# Patient Record
Sex: Male | Born: 1955
Health system: Southern US, Community
[De-identification: ages and names within clinical notes are randomized; demographics above are authoritative.]

## PROBLEM LIST (undated history)

## (undated) DIAGNOSIS — N281 Cyst of kidney, acquired: Secondary | ICD-10-CM

## (undated) DIAGNOSIS — I10 Essential (primary) hypertension: Secondary | ICD-10-CM

## (undated) DIAGNOSIS — I2510 Atherosclerotic heart disease of native coronary artery without angina pectoris: Secondary | ICD-10-CM

## (undated) DIAGNOSIS — E039 Hypothyroidism, unspecified: Secondary | ICD-10-CM

## (undated) DIAGNOSIS — F1020 Alcohol dependence, uncomplicated: Secondary | ICD-10-CM

## (undated) DIAGNOSIS — K760 Fatty (change of) liver, not elsewhere classified: Secondary | ICD-10-CM

## (undated) DIAGNOSIS — G35 Multiple sclerosis: Secondary | ICD-10-CM

## (undated) DIAGNOSIS — R310 Gross hematuria: Secondary | ICD-10-CM

## (undated) DIAGNOSIS — E785 Hyperlipidemia, unspecified: Secondary | ICD-10-CM

## (undated) DIAGNOSIS — C7982 Secondary malignant neoplasm of genital organs: Secondary | ICD-10-CM

## (undated) DIAGNOSIS — G600 Hereditary motor and sensory neuropathy: Secondary | ICD-10-CM

## (undated) DIAGNOSIS — R972 Elevated prostate specific antigen [PSA]: Secondary | ICD-10-CM

## (undated) DIAGNOSIS — K5190 Ulcerative colitis, unspecified, without complications: Secondary | ICD-10-CM

## (undated) DIAGNOSIS — Z8489 Family history of other specified conditions: Secondary | ICD-10-CM

## (undated) DIAGNOSIS — F419 Anxiety disorder, unspecified: Secondary | ICD-10-CM

## (undated) HISTORY — DX: Cyst of kidney, acquired: N28.1

## (undated) HISTORY — DX: Secondary malignant neoplasm of genital organs: C79.82

## (undated) HISTORY — PX: FOOT SURGERY: SHX648

## (undated) HISTORY — DX: Elevated prostate specific antigen (PSA): R97.20

## (undated) HISTORY — PX: VASECTOMY: SHX75

## (undated) HISTORY — DX: Atherosclerotic heart disease of native coronary artery without angina pectoris: I25.10

## (undated) HISTORY — DX: Gross hematuria: R31.0

## (undated) HISTORY — DX: Alcohol dependence, uncomplicated: F10.20

---
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
## 1989-10-07 DIAGNOSIS — G35 Multiple sclerosis: Secondary | ICD-10-CM

## 1989-10-07 HISTORY — DX: Multiple sclerosis: G35

## 2012-10-07 DIAGNOSIS — I2510 Atherosclerotic heart disease of native coronary artery without angina pectoris: Secondary | ICD-10-CM

## 2012-10-07 HISTORY — DX: Atherosclerotic heart disease of native coronary artery without angina pectoris: I25.10

## 2012-10-22 ENCOUNTER — Encounter (HOSPITAL_COMMUNITY): Payer: Self-pay | Admitting: Emergency Medicine

## 2012-10-22 ENCOUNTER — Encounter (HOSPITAL_COMMUNITY): Payer: Self-pay | Admitting: Pharmacy Technician

## 2012-10-22 ENCOUNTER — Emergency Department (HOSPITAL_COMMUNITY): Payer: BC Managed Care – PPO

## 2012-10-22 ENCOUNTER — Encounter (HOSPITAL_COMMUNITY)
Admission: EM | Disposition: A | Discharge status: Home / Self Care | Payer: Self-pay | Source: Home / Self Care | Attending: Interventional Cardiology

## 2012-10-22 ENCOUNTER — Inpatient Hospital Stay (HOSPITAL_COMMUNITY)
Admission: EM | Admit: 2012-10-22 | Discharge: 2012-10-24 | DRG: 854 | Disposition: A | Discharge status: Home / Self Care | Payer: BC Managed Care – PPO | Attending: Interventional Cardiology | Admitting: Interventional Cardiology

## 2012-10-22 DIAGNOSIS — Z87891 Personal history of nicotine dependence: Secondary | ICD-10-CM

## 2012-10-22 DIAGNOSIS — I2510 Atherosclerotic heart disease of native coronary artery without angina pectoris: Principal | ICD-10-CM | POA: Diagnosis present

## 2012-10-22 DIAGNOSIS — I10 Essential (primary) hypertension: Secondary | ICD-10-CM | POA: Diagnosis present

## 2012-10-22 DIAGNOSIS — Z8249 Family history of ischemic heart disease and other diseases of the circulatory system: Secondary | ICD-10-CM

## 2012-10-22 DIAGNOSIS — R079 Chest pain, unspecified: Secondary | ICD-10-CM

## 2012-10-22 DIAGNOSIS — I249 Acute ischemic heart disease, unspecified: Secondary | ICD-10-CM

## 2012-10-22 DIAGNOSIS — E785 Hyperlipidemia, unspecified: Secondary | ICD-10-CM | POA: Diagnosis present

## 2012-10-22 DIAGNOSIS — I200 Unstable angina: Secondary | ICD-10-CM | POA: Diagnosis present

## 2012-10-22 DIAGNOSIS — G35 Multiple sclerosis: Secondary | ICD-10-CM

## 2012-10-22 DIAGNOSIS — K5190 Ulcerative colitis, unspecified, without complications: Secondary | ICD-10-CM

## 2012-10-22 HISTORY — DX: Multiple sclerosis: G35

## 2012-10-22 HISTORY — DX: Essential (primary) hypertension: I10

## 2012-10-22 HISTORY — DX: Hyperlipidemia, unspecified: E78.5

## 2012-10-22 HISTORY — DX: Ulcerative colitis, unspecified, without complications: K51.90

## 2012-10-22 HISTORY — PX: LEFT HEART CATHETERIZATION WITH CORONARY ANGIOGRAM: SHX5451

## 2012-10-22 HISTORY — DX: Fatty (change of) liver, not elsewhere classified: K76.0

## 2012-10-22 LAB — COMPREHENSIVE METABOLIC PANEL
ALT: 113 U/L — ABNORMAL HIGH (ref 0–53)
AST: 54 U/L — ABNORMAL HIGH (ref 0–37)
AST: 53 U/L — ABNORMAL HIGH (ref 0–37)
Albumin: 4.5 g/dL (ref 3.5–5.2)
BUN: 12 mg/dL (ref 6–23)
CO2: 26 mEq/L (ref 19–32)
CO2: 24 mEq/L (ref 19–32)
Calcium: 10.2 mg/dL (ref 8.4–10.5)
Calcium: 9.4 mg/dL (ref 8.4–10.5)
Chloride: 97 mEq/L (ref 96–112)
Creatinine, Ser: 0.76 mg/dL (ref 0.50–1.35)
GFR calc Af Amer: >90 mL/min (ref >90)
GFR calc non Af Amer: >90 mL/min (ref >90)
Glucose, Bld: 150 mg/dL — ABNORMAL HIGH (ref 70–99)
Sodium: 138 mEq/L (ref 135–145)
Total Bilirubin: 0.7 mg/dL (ref 0.3–1.2)
Total Protein: 8.4 g/dL — ABNORMAL HIGH (ref 6.0–8.3)

## 2012-10-22 LAB — CBC WITH DIFFERENTIAL/PLATELET
Basophils Absolute: 0.1 10*3/uL (ref 0.0–0.1)
Eosinophils Absolute: 0.1 10*3/uL (ref 0.0–0.7)
Eosinophils Relative: 2 % (ref 0–5)
Lymphocytes Relative: 43 % (ref 12–46)
MCV: 84.9 fL (ref 78.0–100.0)
Platelets: 197 10*3/uL (ref 150–400)
RDW: 13 % (ref 11.5–15.5)
WBC: 6.7 10*3/uL (ref 4.0–10.5)

## 2012-10-22 LAB — POCT ACTIVATED CLOTTING TIME: Activated Clotting Time: 807 s

## 2012-10-22 LAB — TROPONIN I: Troponin I: <0.3 ng/mL (ref <0.30)

## 2012-10-22 LAB — POCT I-STAT TROPONIN I

## 2012-10-22 SURGERY — LEFT HEART CATHETERIZATION WITH CORONARY ANGIOGRAM
Anesthesia: LOCAL

## 2012-10-22 MED ORDER — HEPARIN (PORCINE) IN NACL 2-0.9 UNIT/ML-% IJ SOLN
INTRAMUSCULAR | Status: AC
  Filled 2012-10-22: qty 1000

## 2012-10-22 MED ORDER — ASPIRIN 81 MG PO CHEW
81.0000 mg | CHEWABLE_TABLET | Freq: Every day | ORAL | Status: DC
  Administered 2012-10-23 – 2012-10-24 (×2): 81 mg via ORAL
  Filled 2012-10-22: qty 3
  Filled 2012-10-22 (×3): qty 1

## 2012-10-22 MED ORDER — NITROGLYCERIN IN D5W 200-5 MCG/ML-% IV SOLN
5.0000 ug/min | INTRAVENOUS | Status: DC
  Administered 2012-10-22: 5 ug/min via INTRAVENOUS

## 2012-10-22 MED ORDER — ADULT MULTIVITAMIN W/MINERALS CH
1.0000 | ORAL_TABLET | Freq: Every day | ORAL | Status: DC
  Administered 2012-10-23 – 2012-10-24 (×2): 1 via ORAL
  Filled 2012-10-22 (×3): qty 1

## 2012-10-22 MED ORDER — NITROGLYCERIN 0.4 MG SL SUBL
0.4000 mg | SUBLINGUAL_TABLET | SUBLINGUAL | Status: AC | PRN
  Administered 2012-10-22: 0.4 mg via SUBLINGUAL
  Filled 2012-10-22: qty 25

## 2012-10-22 MED ORDER — HYDROCHLOROTHIAZIDE 12.5 MG PO CAPS
12.5000 mg | ORAL_CAPSULE | Freq: Every day | ORAL | Status: DC
  Administered 2012-10-23 – 2012-10-24 (×2): 12.5 mg via ORAL
  Filled 2012-10-22 (×3): qty 1

## 2012-10-22 MED ORDER — LISINOPRIL 10 MG PO TABS
10.0000 mg | ORAL_TABLET | Freq: Every day | ORAL | Status: DC
  Administered 2012-10-23 – 2012-10-24 (×2): 10 mg via ORAL
  Filled 2012-10-22 (×3): qty 1

## 2012-10-22 MED ORDER — FENTANYL CITRATE 0.05 MG/ML IJ SOLN
INTRAMUSCULAR | Status: AC
  Filled 2012-10-22: qty 2

## 2012-10-22 MED ORDER — SODIUM CHLORIDE 0.9 % IV SOLN
0.2500 mg/kg/h | INTRAVENOUS | Status: DC
  Filled 2012-10-22: qty 250

## 2012-10-22 MED ORDER — ATORVASTATIN CALCIUM 40 MG PO TABS: 40.0000 mg | ORAL_TABLET | Freq: Every day | ORAL | Status: DC

## 2012-10-22 MED ORDER — NITROGLYCERIN IN D5W 200-5 MCG/ML-% IV SOLN
5.0000 ug/min | INTRAVENOUS | Status: DC
  Filled 2012-10-22: qty 250

## 2012-10-22 MED ORDER — TICAGRELOR 90 MG PO TABS
90.0000 mg | ORAL_TABLET | Freq: Two times a day (BID) | ORAL | Status: DC
  Administered 2012-10-22 – 2012-10-24 (×4): 90 mg via ORAL
  Filled 2012-10-22 (×5): qty 1

## 2012-10-22 MED ORDER — TICAGRELOR 90 MG PO TABS
ORAL_TABLET | ORAL | Status: AC
  Administered 2012-10-22: 22:00:00 90 mg via ORAL
  Filled 2012-10-22: qty 1

## 2012-10-22 MED ORDER — ONDANSETRON HCL 4 MG/2ML IJ SOLN: 4.0000 mg | Freq: Four times a day (QID) | INTRAMUSCULAR | Status: DC | PRN

## 2012-10-22 MED ORDER — SODIUM CHLORIDE 0.9 % IV SOLN
INTRAVENOUS | Status: DC
  Administered 2012-10-22 – 2012-10-24 (×4): via INTRAVENOUS

## 2012-10-22 MED ORDER — SODIUM CHLORIDE 0.9 % IV SOLN
0.2500 mg/kg/h | INTRAVENOUS | Status: AC
  Filled 2012-10-22: qty 250

## 2012-10-22 MED ORDER — BIVALIRUDIN 250 MG IV SOLR
INTRAVENOUS | Status: AC
  Filled 2012-10-22: qty 250

## 2012-10-22 MED ORDER — ACETAMINOPHEN 325 MG PO TABS: 650.0000 mg | ORAL_TABLET | ORAL | Status: DC | PRN

## 2012-10-22 MED ORDER — METOPROLOL TARTRATE 12.5 MG HALF TABLET: 12.5000 mg | ORAL_TABLET | Freq: Two times a day (BID) | ORAL | Status: DC

## 2012-10-22 MED ORDER — NITROGLYCERIN 0.2 MG/ML ON CALL CATH LAB
INTRAVENOUS | Status: AC
  Filled 2012-10-22: qty 1

## 2012-10-22 MED ORDER — SODIUM CHLORIDE 0.9 % IJ SOLN: 3.0000 mL | Freq: Two times a day (BID) | INTRAMUSCULAR | Status: DC

## 2012-10-22 MED ORDER — SODIUM CHLORIDE 0.9 % IJ SOLN: 3.0000 mL | INTRAMUSCULAR | Status: DC | PRN

## 2012-10-22 MED ORDER — SODIUM CHLORIDE 0.9 % IV SOLN: INTRAVENOUS | Status: DC

## 2012-10-22 MED ORDER — DIAZEPAM 5 MG PO TABS
5.0000 mg | ORAL_TABLET | ORAL | Status: AC
  Administered 2012-10-23: 5 mg via ORAL
  Filled 2012-10-22: qty 1

## 2012-10-22 MED ORDER — SODIUM CHLORIDE 0.9 % IJ SOLN
3.0000 mL | Freq: Two times a day (BID) | INTRAMUSCULAR | Status: DC
  Administered 2012-10-22: 22:00:00 3 mL via INTRAVENOUS

## 2012-10-22 MED ORDER — VERAPAMIL HCL 2.5 MG/ML IV SOLN
INTRAVENOUS | Status: AC
  Filled 2012-10-22: qty 2

## 2012-10-22 MED ORDER — MIDAZOLAM HCL 2 MG/2ML IJ SOLN
INTRAMUSCULAR | Status: AC
  Filled 2012-10-22: qty 2

## 2012-10-22 MED ORDER — LIDOCAINE HCL (PF) 1 % IJ SOLN
INTRAMUSCULAR | Status: AC
  Filled 2012-10-22: qty 30

## 2012-10-22 MED ORDER — ASPIRIN 81 MG PO CHEW
324.0000 mg | CHEWABLE_TABLET | Freq: Once | ORAL | Status: AC
  Administered 2012-10-22: 324 mg via ORAL
  Filled 2012-10-22: qty 4

## 2012-10-22 MED ORDER — NITROGLYCERIN IN D5W 200-5 MCG/ML-% IV SOLN
INTRAVENOUS | Status: AC
  Filled 2012-10-22: qty 250

## 2012-10-22 MED ORDER — TICAGRELOR 90 MG PO TABS
ORAL_TABLET | ORAL | Status: AC
  Filled 2012-10-22: qty 1

## 2012-10-22 MED ORDER — NITROGLYCERIN 0.4 MG SL SUBL: 0.4000 mg | SUBLINGUAL_TABLET | SUBLINGUAL | Status: DC | PRN

## 2012-10-22 MED ORDER — SODIUM CHLORIDE 0.9 % IV SOLN: 250.0000 mL | INTRAVENOUS | Status: DC | PRN

## 2012-10-22 MED ORDER — LISINOPRIL-HYDROCHLOROTHIAZIDE 10-12.5 MG PO TABS: 1.0000 | ORAL_TABLET | Freq: Every day | ORAL | Status: DC

## 2012-10-22 NOTE — ED Notes (Signed)
Results of troponin I shown to Dr. Lita Mains.

## 2012-10-22 NOTE — ED Provider Notes (Signed)
History     CSN: [id number]  Arrival date & time 10/22/12  1007   First MD Initiated Contact with Patient 10/22/12 1054      Chief Complaint  Patient presents with  . Chest Pain    (Consider location/radiation/quality/duration/timing/severity/associated sxs/prior treatment) HPI Pt recently started running and for the past few weeks has been experiencing L-sided chest pain with exertion relieved with rest. No SOB, nausea, radiation. Pt started having L chest pain last night that has persisted through the night though is now significantly improved. Pt seen by [facility] card over the summer with neg stress test. Has brother and father who have required CABG. +HTN.  Past Medical History  Diagnosis Date  . Hypertension   . Colitis   . Ulcerative colitis   . Hyperlipidemia LDL goal < 70   . Multiple sclerosis     Bilateral optic neuritis, treated and resolved but felt to be a manifestation of MS.  . Hepatic steatosis     No past surgical history on file.  Family History  Problem Relation Age of Onset  . Dementia Mother   . Hypertension Mother   . Coronary artery disease Father   . Diabetes Mellitus II Father   . Coronary artery disease Brother   . Hypertension Brother     History  Substance Use Topics  . Smoking status: Former Research scientist (life sciences)  . Smokeless tobacco: Not on file  . Alcohol Use: Yes     Comment: drinks beer and wine      Review of Systems  Constitutional: Negative for fever and chills.  HENT: Negative for neck pain.   Respiratory: Negative for cough and shortness of breath.   Cardiovascular: Positive for chest pain. Negative for palpitations and leg swelling.  Gastrointestinal: Negative for nausea, vomiting and abdominal pain.  Musculoskeletal: Negative for myalgias and back pain.  Skin: Negative for rash and wound.  Neurological: Negative for dizziness, syncope, weakness, light-headedness, numbness and headaches.    Allergies  Review of patient's allergies  indicates no known allergies.  Home Medications   Current Outpatient Rx  Name  Route  Sig  Dispense  Refill  . FLAXSEED (LINSEED) PO   Oral   Take 1 tablet by mouth daily.         Marland Kitchen LISINOPRIL-HYDROCHLOROTHIAZIDE 10-12.5 MG PO TABS   Oral   Take 1 tablet by mouth daily.         Creed Copper M PLUS PO TABS   Oral   Take 1 tablet by mouth daily.         . ASPIRIN 325 MG PO TABS   Oral   Take 325 mg by mouth as needed. For chest pain           BP 126/70  Pulse 60  Temp 98.7 F (37.1 C)  Resp 18  SpO2 94%  Physical Exam  Nursing note and vitals reviewed. Constitutional: He is oriented to person, place, and time. He appears well-developed and well-nourished. No distress.  HENT:  Head: Normocephalic and atraumatic.  Mouth/Throat: Oropharynx is clear and moist.  Eyes: EOM are normal. Pupils are equal, round, and reactive to light.  Neck: Normal range of motion. Neck supple.  Cardiovascular: Normal rate and regular rhythm.   Pulmonary/Chest: Effort normal and breath sounds normal. No respiratory distress. He has no wheezes. He has no rales. He exhibits no tenderness.  Abdominal: Soft. Bowel sounds are normal. He exhibits no mass. There is no tenderness. There is no rebound  and no guarding.  Musculoskeletal: Normal range of motion. He exhibits no edema and no tenderness.       No calf swelling or tenderness.   Neurological: He is alert and oriented to person, place, and time.  Skin: Skin is warm and dry. No rash noted. No erythema.  Psychiatric: He has a normal mood and affect. His behavior is normal.    ED Course  Procedures (including critical care time)  Labs Reviewed  CBC WITH DIFFERENTIAL - Abnormal; Notable for the following:    Hemoglobin 17.2 (*)     MCHC 36.4 (*)     All other components within normal limits  COMPREHENSIVE METABOLIC PANEL - Abnormal; Notable for the following:    Glucose, Bld 105 (*)     Total Protein 8.4 (*)     AST 54 (*)     ALT 113  (*)     All other components within normal limits  POCT I-STAT TROPONIN I - Abnormal; Notable for the following:    Troponin i, poc 0.09 (*)     All other components within normal limits  TROPONIN I  TROPONIN I  TROPONIN I  TSH  PRO B NATRIURETIC PEPTIDE  HEMOGLOBIN A1C   Dg Chest 2 View  10/22/2012  *RADIOLOGY REPORT*  Clinical Data: Left chest pain  CHEST - 2 VIEW  Comparison: None.  Findings: Lungs are clear. No pleural effusion or pneumothorax.  Cardiomediastinal silhouette is within normal limits.  Mild degenerative changes of the visualized thoracolumbar spine.  IMPRESSION: No evidence of acute cardiopulmonary disease.   Original Report Authenticated By: Julian Hy, M.D.      1. Chest pain      Date: 10/22/2012  Rate: 65  Rhythm: normal sinus rhythm  QRS Axis: normal  Intervals: normal  ST/T Wave abnormalities: normal  Conduction Disutrbances:none  Narrative Interpretation:   Old EKG Reviewed: none available    MDM  Discussed with Dr Tamala Julian who will see pt in ED.         Julianne Rice, MD 10/22/12 1344

## 2012-10-22 NOTE — CV Procedure (Signed)
Diagnostic Cardiac Catheterization Report  Gary [name]  57 y.o.  male [birth year]  Procedure Date: 10/22/2012 Referring Physician: Reece Levy, M.D. Primary Cardiologist:    PROCEDURE:  Left heart catheterization with selective coronary angiography, left ventriculogram, and PCI with stenting of the left circumflex .   INDICATIONS:   Acute coronary syndrome   The risks, benefits, and details of the procedure were explained to the patient.  The patient verbalized understanding and wanted to proceed.  Informed written consent was obtained.  PROCEDURE TECHNIQUE:  After Xylocaine anesthesia a  5 [place] and subsequent 6 French  sheath was placed in the right  radial  artery with a single anterior needle wall stick.   Coronary angiography was done using a  5 [place] A2 MP, 5 French 3.5 cm JL catheter.  Left ventriculography was done using a  5 French A2 MP catheter using hand injection.   A bivalirudin bolus and infusion was started. The patient was loaded with Brilinta, 180 mg, and the ACT was documented to be greater than 300.  After identifying severe lesions in the circumflex and LAD we upgraded to a 6 [place]. Because of a left innominate origin from the arch was leftward, I had difficulty getting into ascending aorta. Torquing and guide catheter movement was difficult. We eventually used a JL 3.0 guide catheter to perform PCI on the circumflex. A BMW wire was loaded with a 2.5 x 12 mm balloon. Predilatation was performed without difficulty.  We positioned and deployed a 16 x 3.5 mm Promus Premier DES to 14 atmospheres. We then post dilated with a 3.75 x 12 mm long [place] balloon to 14 atmospheres. The patient experienced no symptoms. Angiographic result was felt to be excellent.  We then turned our attention to the left anterior descending. I had difficulty selectively engaging the left anterior descending D2 a short left main and tendency for each guide cath to select the circumflex. The  guide catheter the that came closest to selectively engaging the LAD was an XB LAD 3 cm. After multiple attempts I ever I decided to terminate the case. I plan would be to return at a later date perhaps within 24-36 hours to perform the LAD via the right femoral approach. No complications occurred during the procedure.    CONTRAST:  Total of  225  cc.  COMPLICATIONS:  None.    HEMODYNAMICS:  Aortic pressure was  110/70 mmHg ; LV pressure was  112/5 mmHg ; LVEDP 13 mmHg.  There was no gradient between the left ventricle and aorta.    ANGIOGRAPHIC DATA:   The left main coronary artery is  is short and widely patent. .  The left anterior descending artery is poorly visualized due to subselectively of the circumflex by nearly every catheter that we used it is clear that there is severe obstruction in the mid LAD beyond the first septal perforator and diagonal. The LAD is large and transapical.  The left circumflex artery is severely diseased with a proximal segmental 95% the circumflex trifurcates on the lateral wall..  The right coronary artery is  dominant. Gives origin to the PDA and 2 left ventricular branches. The proximal vessel contains 50-70% stenosis .  PCI RESULTS:  The circumflex stenosis was reduced from 95% to 0% with TIMI grade 3 flow following stenting .  The LAD was never wired as I could not selectively engage the vessel from the right radial approach due to leftward origin of the innominate  artery from the arch. The plan is to return early in the a.m. from the right femoral approach to perform LAD PCI.   LEFT VENTRICULOGRAM:  Left ventricular angiogram was done in the 30 RAO projection and revealed normal left ventricular wall motion and systolic function with an estimated ejection fraction of  55%.  LVEDP was  12  mmHg.  IMPRESSIONS:  1. Acute coronary syndrome due to high-grade disease in the LAD and circumflex. There is intermedius stenosis in the proximal RCA.  2.  Successful stenting of the circumflex from 95% to 0% with TIMI grade 3 flow using DES.  3. Residual high-grade mid LAD disease  4. Normal left ventricular function.   RECOMMENDATION:  1. PCI of the LAD in a.m.  2. Aggressive risk factor modification.

## 2012-10-22 NOTE — H&P (Signed)
Gary [name] is a 57 y.o. male  Admit date: 10/22/2012 Referring Physician: Reece Levy, M.D. Primary Cardiologist:: Rosanna Randy, III Chief complaint / reason for admission: Acute coronary syndrome  HPI: 57 year old gentleman with a two-week history of exertional chest pressure. He has been active then tends to run to stay in shape. Since beginning of the year after 1/2-3/4 of a mile he develops chest pressure and dyspnea. Starting in resting will help it to go away. Last evening he had pain at rest after having a conversation with his wife that had to do her finances. He was having mild discomfort in his chest when he arrived in the emergency room this morning. One sublingual nitroglycerin relieved the discomfort. The first troponin I is mildly elevated at 0.09.    PMH:    Past Medical History  Diagnosis Date  . Hypertension   . Colitis   . Ulcerative colitis   . Hyperlipidemia LDL goal < 70   . Multiple sclerosis     Bilateral optic neuritis, treated and resolved but felt to be a manifestation of MS.  . Hepatic steatosis     PSH:   No past surgical history on file.  ALLERGIES:   Review of patient's allergies indicates no known allergies.  Prior to Admit Meds:   (Not in a hospital admission) Family HX:    Family History  Problem Relation Age of Onset  . Dementia Mother   . Hypertension Mother   . Coronary artery disease Father   . Diabetes Mellitus II Father   . Coronary artery disease Brother   . Hypertension Brother    Social HX:    History   Social History  . Marital Status: Married    Spouse Name: N/A    Number of Children: 1  . Years of Education: N/A   Occupational History  . 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 Smoking status: Former Research scientist (life sciences)  . Smokeless tobacco: Not on file  . Alcohol Use: Yes     Comment: drinks beer and wine  . Drug Use: Not on file  . Sexually Active: Yes   Other Topics Concern  . Not on file   Social  History Narrative  . No narrative on file     ROS: He has occasional and and all distorted tissue. He has a history of ulcer of colitis and has had rarely over the past 20 years, hematochezia. He denies recent surgery. He denies orthopnea, PND, palpitations, syncope, claudication, neurological deficits, headache, and intrinsic lung disease  Physical Exam: Blood pressure 126/70, pulse 60, temperature 98.7 F (37.1 C), resp. rate 18, SpO2 94.00%.   Skin is warm and dry  HEENT exam reveals pupils are equal and reactive to light.  Neck exam reveals no JVD or carotid bruits. Carotid upstroke is 2+.  Cardiac exam reveals an S4 gallop. No murmurs heard. PMI is nonpalpable.  Lungs are clear auscultation and percussion.  Abdomen is soft. No bruits are heard. There is no tenderness.  Extremities no edema. Pulses are 2+ and symmetric in the radials, femorals, and posterior tibials bilaterally.  Neurological exam reveals no focal motor or sensory deficit. Labs:   Lab Results  Component Value Date   WBC 6.7 10/22/2012   HGB 17.2* 10/22/2012   HCT 47.2 10/22/2012   MCV 84.9 10/22/2012   PLT 197 10/22/2012    Lab 10/22/12 1025  NA 138  K 3.9  CL 99  CO2 26  BUN 13  CREATININE 0.74  CALCIUM 10.2  PROT 8.4*  BILITOT 0.5  ALKPHOS 81  ALT 113*  AST 54*  GLUCOSE 105*      Radiology:   RADIOLOGY REPORT*  Clinical Data: Left chest pain  CHEST - 2 VIEW  Comparison: None.  Findings: Lungs are clear. No pleural effusion or pneumothorax.  Cardiomediastinal silhouette is within normal limits.  Mild degenerative changes of the visualized thoracolumbar spine.  IMPRESSION:  No evidence of acute cardiopulmonary disease.  Original Report Authenticated By: Julian Hy, M.D.   EKG:  Normal  ASSESSMENT:   1. Acute coronary syndrome with patient currently pain free  2. History of ulcerative colitis with prior history of GI bleed though never required transfusion.  3. Bilateral  optic neuritis with subsequent resolution felt to be a manifestation of multiple sclerosis. Clinically resolved.  4. Hypertension   Plan:  1. Because her presentation, I believe the patient needs urgent coronary angiography to define anatomy which will then help guide therapy. I discussed the clinical indication for catheterization and the risk involved. Risks including stroke, death, myocardial infarction, bleeding, allergy, limb ischemia, kidney injury, among others were discussed in detail with the patient and his wife. The patient has accepted this diagnostic test and is willing to proceed.  2. IV heparin  3. Beta blocker therapy  4. Statin therapy  5. Aspirin likely other antiplatelet agents  6. Percutaneous coronary intervention with stenting will carry a slightly greater risk than typical because of the patient's ulcerative colitis and the potential for dual antiplatelet therapy interruption.  Sinclair Grooms 10/22/2012 1:34 PM

## 2012-10-22 NOTE — ED Notes (Signed)
Cp since last night  Left sided  No n/v/sob no dizziness  straong family hx of heart disease dad and bro

## 2012-10-22 NOTE — ED Notes (Signed)
Pt transported to Cath Lab with RN on monitor

## 2012-10-23 ENCOUNTER — Encounter (HOSPITAL_COMMUNITY)
Admission: EM | Disposition: A | Discharge status: Home / Self Care | Payer: Self-pay | Source: Home / Self Care | Attending: Interventional Cardiology

## 2012-10-23 HISTORY — PX: PERCUTANEOUS CORONARY STENT INTERVENTION (PCI-S): SHX5485

## 2012-10-23 LAB — LIPID PANEL
Cholesterol: 237 mg/dL — ABNORMAL HIGH (ref 0–200)
HDL: 33 mg/dL — ABNORMAL LOW (ref >39)
Triglycerides: 300 mg/dL — ABNORMAL HIGH (ref <150)
VLDL: 60 mg/dL — ABNORMAL HIGH (ref 0–40)

## 2012-10-23 LAB — BASIC METABOLIC PANEL
CO2: 24 mEq/L (ref 19–32)
Calcium: 9 mg/dL (ref 8.4–10.5)
Potassium: 3.6 mEq/L (ref 3.5–5.1)
Sodium: 139 mEq/L (ref 135–145)

## 2012-10-23 LAB — CBC
MCH: 29.9 pg (ref 26.0–34.0)
Platelets: 166 10*3/uL (ref 150–400)
RBC: 4.99 MIL/uL (ref 4.22–5.81)
WBC: 6.8 10*3/uL (ref 4.0–10.5)

## 2012-10-23 LAB — POCT ACTIVATED CLOTTING TIME
Activated Clotting Time: 415 seconds
Activated Clotting Time: >1000 seconds

## 2012-10-23 LAB — PROTIME-INR: INR: 0.89 (ref 0.00–1.49)

## 2012-10-23 SURGERY — PERCUTANEOUS CORONARY STENT INTERVENTION (PCI-S)
Anesthesia: LOCAL

## 2012-10-23 MED ORDER — LIDOCAINE HCL (PF) 1 % IJ SOLN
INTRAMUSCULAR | Status: AC
  Filled 2012-10-23: qty 30

## 2012-10-23 MED ORDER — ATORVASTATIN CALCIUM 20 MG PO TABS
20.0000 mg | ORAL_TABLET | Freq: Every day | ORAL | Status: DC
  Administered 2012-10-23: 18:00:00 20 mg via ORAL
  Filled 2012-10-23 (×2): qty 1

## 2012-10-23 MED ORDER — TICAGRELOR 90 MG PO TABS: 90.0000 mg | ORAL_TABLET | Freq: Two times a day (BID) | ORAL | Status: DC

## 2012-10-23 MED ORDER — BIVALIRUDIN 250 MG IV SOLR
INTRAVENOUS | Status: AC
  Filled 2012-10-23: qty 250

## 2012-10-23 MED ORDER — FENTANYL CITRATE 0.05 MG/ML IJ SOLN
INTRAMUSCULAR | Status: AC
  Filled 2012-10-23: qty 2

## 2012-10-23 MED ORDER — MIDAZOLAM HCL 2 MG/2ML IJ SOLN
INTRAMUSCULAR | Status: AC
  Filled 2012-10-23: qty 2

## 2012-10-23 MED ORDER — ATORVASTATIN CALCIUM 20 MG PO TABS: 20.0000 mg | ORAL_TABLET | Freq: Every day | ORAL | Status: DC

## 2012-10-23 MED ORDER — NITROGLYCERIN 0.4 MG SL SUBL: 0.4000 mg | SUBLINGUAL_TABLET | SUBLINGUAL | Status: DC | PRN

## 2012-10-23 MED ORDER — METOPROLOL SUCCINATE ER 25 MG PO TB24: 25.0000 mg | ORAL_TABLET | Freq: Every day | ORAL | Status: DC

## 2012-10-23 MED ORDER — METOPROLOL SUCCINATE ER 25 MG PO TB24
25.0000 mg | ORAL_TABLET | Freq: Every day | ORAL | Status: DC
  Administered 2012-10-23 – 2012-10-24 (×2): 25 mg via ORAL
  Filled 2012-10-23 (×2): qty 1

## 2012-10-23 MED ORDER — NITROGLYCERIN 0.2 MG/ML ON CALL CATH LAB
INTRAVENOUS | Status: AC
  Filled 2012-10-23: qty 1

## 2012-10-23 MED ORDER — HEPARIN (PORCINE) IN NACL 2-0.9 UNIT/ML-% IJ SOLN
INTRAMUSCULAR | Status: AC
  Filled 2012-10-23: qty 1000

## 2012-10-23 MED ORDER — ASPIRIN 81 MG PO CHEW: 81.0000 mg | CHEWABLE_TABLET | Freq: Every day | ORAL | Status: DC

## 2012-10-23 MED FILL — Dextrose Inj 5%: INTRAVENOUS | Qty: 50 | Status: AC

## 2012-10-23 NOTE — Progress Notes (Signed)
Utilization Review Completed Secily Walthour J. Ebb Carelock, RN, BSN, NCM [phone]  

## 2012-10-23 NOTE — H&P (Addendum)
   Patient Name: Gary [name] Date of Encounter: 10/23/2012   SUBJECTIVE: No difficulty overnight. Creat is .8. There were no radial site abnormalities. We are proceeding with LAD PCI from femoral approach since unable to selectively engage the LAD from radial.  He has not had angina or other complaints overnight.  TELEMETRY:  Normal sinus rhythm: Filed Vitals:   10/22/12 2237 10/23/12 0025 10/23/12 0445 10/23/12 0819  BP:  114/71 120/65   Pulse:  54 60 65  Temp:  97.9 F (36.6 C) 97.8 F (36.6 C)   TempSrc:  Oral Oral   Resp:  15 16   Height: 6' (1.829 m)     Weight:  108 kg (238 lb 1.6 oz)    SpO2:  99% 100%     Intake/Output Summary (Last 24 hours) at 10/23/12 1113 Last data filed at 10/23/12 0800  Gross per 24 hour  Intake 2441.5 ml  Output   1225 ml  Net 1216.5 ml    LABS: Basic Metabolic Panel:  Basename 10/23/12 0518 10/22/12 1939  NA 139 136  K 3.6 3.1*  CL 102 97  CO2 24 24  GLUCOSE 105* 150*  BUN 12 12  CREATININE 0.77 0.76  CALCIUM 9.0 9.4  MG -- --  PHOS -- --   CBC:  Basename 10/23/12 0518 10/22/12 1025  WBC 6.8 6.7  NEUTROABS -- 3.2  HGB 14.9 17.2*  HCT 42.5 47.2  MCV 85.2 84.9  PLT 166 197   Cardiac Enzymes:  Basename 10/22/12 1343  CKTOTAL --  CKMB --  CKMBINDEX --  TROPONINI <0.30   Hemoglobin A1C:  Basename 10/22/12 1349  HGBA1C 5.8*   Fasting Lipid Panel:  Basename 10/23/12 0518  CHOL 237*  HDL 33*  LDLCALC 144*  TRIG 300*  CHOLHDL 7.2  LDLDIRECT --    Radiology/Studies:  No new data  Physical Exam: Blood pressure 120/65, pulse 65, temperature 97.8 F (36.6 C), temperature source Oral, resp. rate 16, height 6' (1.829 m), weight 108 kg (238 lb 1.6 oz), SpO2 100.00%. Weight change:    Right radial cath site is unremarkable  Lungs are clear auscultation and percussion  Cardiac exam reveals an S4   ASSESSMENT:  1. Acute coronary syndrome with high-grade obstruction in the circumflex and LAD.  2. Successful  stenting of the circumflex 18 hours ago.  3. Uncontrolled was factors of hypertension and hyperlipidemia.  4. Ulcerative colitis without evidence of bleeding on current anticoagulation regimen  Plan:  1. PCI of LAD this morning  2. If all goes well would anticipate discharge 10/24/2012  SignedSinclair Grooms 10/23/2012, 11:13 AM

## 2012-10-23 NOTE — Progress Notes (Signed)
TR BAND REMOVAL  LOCATION:    right radial  DEFLATED PER PROTOCOL:    yes  TIME BAND OFF / DRESSING APPLIED:    2130  SITE UPON ARRIVAL:    Level 0  SITE AFTER BAND REMOVAL:    Level 0  REVERSE ALLEN'S TEST:     positive  CIRCULATION SENSATION AND MOVEMENT:    Within Normal Limits   yes  COMMENTS:   Post radial cath instructions reviewed w/ pt.  Wearing an armboard loosely for fear of moving it too much.

## 2012-10-23 NOTE — Discharge Summary (Addendum)
Patient ID: Gary [name] MRN: [id number] DOB/AGE: 11-01-1955 57 y.o.  Admit date: 10/22/2012 Discharge date: 10/23/2012  Patient Active Problem List  Diagnosis  . Ulcerative colitis  . Hypertension, essential  . Acute coronary syndrome  . Hyperlipidemia  . H/O multiple sclerosis    Primary Discharge Diagnosis:  Acute coronary syndrome Secondary Discharge Diagnosis: Ulcerative colitis, relatively inactive  2. Hypertension  3. Hyperlipidemia  4. Multiple sclerosis  Significant Diagnostic Studies: angiography: 1. Diagnostic cath and circumflex DE stenting 10/22/12  2. DES stent implantation LAD, 10/23/12  Consults: None  Hospital Course:  57 year old gentleman presented with a 2 to 3-week history of exertional chest pain compatible with accelerating angina. In the emergency room a single point of care marker was mildly elevated. No acute EKG changes were noted. Urging catheterization was performed and revealed high-grade obstruction in the LAD and circumflex. The right coronary had a proximal indeterminate stenosis.  The circumflex was stented during the diagnostic procedure. We were unable to find a catheter that could selectively engage the LAD (short LM/separate ostial left coronary system). We therefore brought the patient back the following morning and did PCI of the LAD from the femoral approach without complications.  With ambulation and education from cardiac rehabilitation, the patient has felt well, had questions answered, and had no post-catheterization complications.   Discharge Exam: Blood pressure 120/65, pulse 65, temperature 97.8 F (36.6 C), temperature source Oral, resp. rate 16, height 6' (1.829 m), weight 108 kg (238 lb 1.6 oz), SpO2 100.00%.   No right radial or femoral access site complications or hematoma.  Chest is clear  Cardiac exam unremarkable Labs:   Lab Results  Component Value Date   WBC 6.8 10/23/2012   HGB 14.9 10/23/2012   HCT 42.5  10/23/2012   MCV 85.2 10/23/2012   PLT 166 10/23/2012    Lab 10/23/12 0518 10/22/12 1939  NA 139 --  K 3.6 --  CL 102 --  CO2 24 --  BUN 12 --  CREATININE 0.77 --  CALCIUM 9.0 --  PROT -- 7.6  BILITOT -- 0.7  ALKPHOS -- 70  ALT -- 101*  AST -- 53*  GLUCOSE 105* --   Lab Results  Component Value Date   TROPONINI <0.30 10/22/2012    Lab Results  Component Value Date   CHOL 237* 10/23/2012   Lab Results  Component Value Date   HDL 33* 10/23/2012   Lab Results  Component Value Date   LDLCALC 144* 10/23/2012   Lab Results  Component Value Date   TRIG 300* 10/23/2012   Lab Results  Component Value Date   CHOLHDL 7.2 10/23/2012   No results found for this basename: LDLDIRECT      Radiology: No acute disease  EKG:  New precordial biphasic T waves  FOLLOW UP PLANS AND APPOINTMENTS    Medication List     As of 10/23/2012 11:21 AM    STOP taking these medications         aspirin 325 MG tablet      FLAXSEED (LINSEED) PO      TAKE these medications         aspirin 81 MG chewable tablet   Chew 1 tablet (81 mg total) by mouth daily.      atorvastatin 20 MG tablet   Commonly known as: LIPITOR   Take 1 tablet (20 mg total) by mouth daily at 6 PM.      lisinopril-hydrochlorothiazide 10-12.5 MG per tablet  Commonly known as: PRINZIDE,ZESTORETIC   Take 1 tablet by mouth daily.      metoprolol succinate 25 MG 24 hr tablet   Commonly known as: TOPROL-XL   Take 1 tablet (25 mg total) by mouth daily.      multivitamins ther. w/minerals Tabs   Take 1 tablet by mouth daily.      nitroGLYCERIN 0.4 MG SL tablet   Commonly known as: NITROSTAT   Place 1 tablet (0.4 mg total) under the tongue every 5 (five) minutes as needed for chest pain.      Ticagrelor 90 MG Tabs tablet   Commonly known as: BRILINTA   Take 1 tablet (90 mg total) by mouth 2 (two) times daily.           Follow-up Information    Follow up with Sinclair Grooms, MD. On 11/02/2012. (10:10 A)      Contact information:   [place] [phone]          BRING ALL MEDICATIONS WITH YOU TO FOLLOW UP APPOINTMENTS  Time spent with patient to include physician time:  30 minutes Signed: Sinclair Grooms 10/23/2012, 11:21 AM

## 2012-10-23 NOTE — CV Procedure (Signed)
   PERCUTANEOUS CORONARY INTERVENTION   Gary [name] is a 57 y.o. male  INDICATION: Acute coronary syndrome with 99% mid LAD and TIMI grade 2 flow   PROCEDURE: LAD drug-eluting stent implantation   CONSENT: The risks, benefits, and details of the procedure were explained to the patient. Risks including death, MI, stroke, bleeding, limb ischemia, renal failure and allergy were described and accepted by the patient.  Informed written consent was obtained prior to proceeding.  PROCEDURE TECHNIQUE:  After Xylocaine anesthesia a 6 French sheath was placed in the right femoral artery with a single anterior needle wall stick.   Coronary guiding shots were made using a 3.5 XB LAD 6 French catheter. Antithrombotic therapy, bivalirudin bolus and infusion, was begun and determined to be therapeutic by ACT. Antiplatelet therapy, Brilinta, was loaded.  We used a parotid guidewire across the stenosis in the LAD. We used an LAO cranial view as our guiding shot. I didn't RAO view but did not fully appreciate the length of the lesion. We predilated with a 3.5 x 12 mm balloon. Post dilatation the lesion appeared to be longer than previously thought. We then positioned and deployed a 4.0 x 20 mm long Promus Premier to 10 atmospheres. The stent had already been chosen prior to seeing the post dilatation anatomy. After deployment I felt that there was still a very short region of disease proximal to the stent that needed therapy. We then placed a 4.0 x 12 mm Promus Premier slightly overlapping the first stent. The stent was deployed at 14 atmospheres. Postdilatation was then performed using a 4.5 x 15 mm long [place] Quantum throughout the proximal two thirds of the stented segment up to 14 atmospheres. Postdilatation the distal third of the stent appeared to be under and a then used a 4.0 x 12 mm [place] balloon in the distalmost the third of the stent dilating to 14 atmospheres the final angiographic result was very acceptable.  TIMI grade 3 flow was noted. A 0% stenosis was noted . Angio-Seal was used for hemostasis.  CONTRAST:  Total of 110 cc.  COMPLICATIONS:  None.    ANGIOGRAPHIC RESULTS:   99% focal/eccentrically segmental mid LAD stenosis with TIMI grade 2 flow reduced to 0% with TIMI grade 3 flow following drug-eluting stent implantation. Unfortunately the overlapping stents were used but angiographic result was very nice.   IMPRESSIONS:  Successful DES implantation in the mid LAD with reduction and 99% stenosis with TIMI grade 2 flow to 0% with TIMI grade 3 flow.   RECOMMENDATION:  Aspirin and Brilinta  Anticipate a.m. discharge, 10/24/2012.Marland Kitchen    Sinclair Grooms, MD 10/23/2012 9:59 AM

## 2012-10-23 NOTE — Progress Notes (Signed)
10/23/12 1530 informed pt of Brilinta copay of $75/30 day retail and $187.99/90 day mail-in.  Pt will be able to afford this medication.  Checked with [facility] [address] to confirm supply as pt will [facility] from [facility]. Pt/family given Brilinta 30 day free card by [facility] staff. Fuller Mandril, RN, BSN, [place] [phone].

## 2012-10-24 LAB — BASIC METABOLIC PANEL
Calcium: 8.7 mg/dL (ref 8.4–10.5)
GFR calc Af Amer: >90 mL/min (ref >90)
GFR calc non Af Amer: >90 mL/min (ref >90)
Potassium: 4 mEq/L (ref 3.5–5.1)
Sodium: 137 mEq/L (ref 135–145)

## 2012-10-24 LAB — CBC
MCH: 30.8 pg (ref 26.0–34.0)
MCHC: 36 g/dL (ref 30.0–36.0)
Platelets: 159 10*3/uL (ref 150–400)
RDW: 13 % (ref 11.5–15.5)

## 2012-10-24 NOTE — Progress Notes (Signed)
CARDIAC REHAB PHASE I   PRE:  Rate/Rhythm: 62 sinus rhythm  BP:  Supine:   Sitting: 128/72  Standing:    SaO2: 96%  MODE:  Ambulation: 700 ft   POST:  Rate/Rhythem: 58 sinus  BP:  Supine:   Sitting: 00050  Standing:    SaO2: 99%  802-904 Pt ambulated in hallway without difficulty.  Steady gait. Pt education complete.  Pt oriented to phase II cardiac rehab.  At pt request referral will be sent to [facility] outpatient rehab.  Pt and wife verbalized understanding  [place], [place]

## 2012-10-24 NOTE — Progress Notes (Signed)
Subjective:  No c/o chest pain or SOB  Objective:  Vital Signs in the last 24 hours: BP 151/73  Pulse 65  Temp 98 F (36.7 C) (Oral)  Resp 17  Ht 6' (1.829 m)  Wt 109.7 kg (241 lb 13.5 oz)  BMI 32.80 kg/m2  SpO2 98%  Physical Exam: Pleasant BM in NAD Lungs:  Clear  Cardiac:  Regular rhythm, normal S1 and S2, no S3 Extremities: Both radial and femoral access sites clean and dry  Intake/Output from previous day: 01/17 0701 - 01/18 0700 In: 3821.5 [P.O.:680; I.V.:3141.5] Out: 2775 [Urine:2775] Weight Filed Weights   10/22/12 1500 10/23/12 0025 10/24/12 0545  Weight: 104 kg (229 lb 4.5 oz) 108 kg (238 lb 1.6 oz) 109.7 kg (241 lb 13.5 oz)    Lab Results: Basic Metabolic Panel:  Basename 10/24/12 0556 10/23/12 0518  NA 137 139  K 4.0 3.6  CL 103 102  CO2 21 24  GLUCOSE 105* 105*  BUN 10 12  CREATININE 0.76 0.77    CBC:  Basename 10/24/12 0556 10/23/12 0518 10/22/12 1025  WBC 6.8 6.8 --  NEUTROABS -- -- 3.2  HGB 14.2 14.9 --  HCT 39.4 42.5 --  MCV 85.5 85.2 --  PLT 159 166 --   Telemetry: Sinus rhythm  Assessment/Plan: 1. Stable post PCI 2. Hypertension  Rec:  OK to d/c home      W. Doristine Church  MD [facility]  10/24/2012, 9:25 AM

## 2012-10-26 MED FILL — Dextrose Inj 5%: INTRAVENOUS | Qty: 50 | Status: AC

## 2012-10-26 NOTE — Progress Notes (Signed)
Discharged concurrent review completed. Janita Camberos J. Clydene Laming, [place], [place], [employer] [phone]

## 2013-07-02 ENCOUNTER — Other Ambulatory Visit: Payer: Self-pay | Admitting: Interventional Cardiology

## 2013-07-02 DIAGNOSIS — Z79899 Other long term (current) drug therapy: Secondary | ICD-10-CM

## 2013-07-02 DIAGNOSIS — E785 Hyperlipidemia, unspecified: Secondary | ICD-10-CM

## 2013-07-13 ENCOUNTER — Telehealth: Payer: Self-pay | Admitting: *Deleted

## 2013-07-13 ENCOUNTER — Telehealth: Payer: Self-pay | Admitting: Interventional Cardiology

## 2013-07-13 NOTE — Telephone Encounter (Signed)
lmom.per Dr.Smith no medication is neede before dental procedure

## 2013-07-13 NOTE — Telephone Encounter (Signed)
New Problem  Pt needs premeds for a dental appt this afternoon at 3pm. [facility] to [facility] [address]. Please call.

## 2013-07-13 NOTE — Telephone Encounter (Signed)
Lovey Newcomer states that pt informed the office he has a surgical screw in his foot. Sandy asked does he need to to be pre-medicated prior to dental gum exam?  Pt had surgery by Dr. Janus Molder greater than 6 mos ago, and Dr Blenda Mounts states pt does not need pre-medication prior to exam.  Notice faxed with above explanation to [phone].

## 2013-08-05 ENCOUNTER — Encounter: Payer: Self-pay | Admitting: Interventional Cardiology

## 2013-10-22 ENCOUNTER — Ambulatory Visit: Payer: BC Managed Care – PPO | Admitting: Interventional Cardiology

## 2013-11-10 ENCOUNTER — Telehealth: Payer: Self-pay | Admitting: Interventional Cardiology

## 2013-11-10 NOTE — Telephone Encounter (Signed)
New message     Seeing Dr Tamala Julian on 11-16-13.  Want to have cholesterol checked on Friday.  Not order in computer.  Can pt have labs on Friday?  Pls call pt and let him know--ok to leave a msg on vm

## 2013-11-10 NOTE — Telephone Encounter (Signed)
lmom for pt to return call.per ECW Dr.Smith last note associated with pt lab pt needs to repeat lipid, alt 06/2014

## 2013-11-16 ENCOUNTER — Ambulatory Visit: Payer: BC Managed Care – PPO | Admitting: Interventional Cardiology

## 2013-11-20 ENCOUNTER — Encounter: Payer: Self-pay | Admitting: Interventional Cardiology

## 2013-11-25 ENCOUNTER — Encounter: Payer: Self-pay | Admitting: Interventional Cardiology

## 2013-11-25 ENCOUNTER — Ambulatory Visit (INDEPENDENT_AMBULATORY_CARE_PROVIDER_SITE_OTHER): Payer: BC Managed Care – PPO | Admitting: Interventional Cardiology

## 2013-11-25 VITALS — BP 138/82 | HR 53 | Ht 72.0 in | Wt 239.4 lb

## 2013-11-25 DIAGNOSIS — E785 Hyperlipidemia, unspecified: Secondary | ICD-10-CM

## 2013-11-25 DIAGNOSIS — I10 Essential (primary) hypertension: Secondary | ICD-10-CM

## 2013-11-25 DIAGNOSIS — I2510 Atherosclerotic heart disease of native coronary artery without angina pectoris: Secondary | ICD-10-CM

## 2013-11-25 NOTE — Progress Notes (Signed)
Patient ID: Gary [name], male   DOB: 1955-12-24, 58 y.o.   MRN: [id number]    [address], [place]  [address] Phone: [phone] Fax:  [phone]  Date:  11/25/2013   ID:  Gary [name], DOB 06-16-1956, MRN [id number]  PCP:  Helane Rima, MD   ASSESSMENT:  1. Coronary atherosclerosis without angina 2. Hyperlipidemia 3. Hypertension 4. EtOH excess, much improved   PLAN:  1. Discontinue Brilinta in July 2. Clinical followup in one year 3. Fasting lipid panel and hepatic function study 4. He asked for recommendations for a new primary physician since Dr. Lavone Neri has moved   SUBJECTIVE: Gary [name] is a 58 y.o. male who is asymptomatic with reference to cardiovascular complaints. He denies angina. He has not used nitroglycerin. Exercise is been off and on. There is been no recurrence of urinary bleeding/hematuria. He denies orthopnea, PND, palpitations, and syncope.   Wt Readings from Last 3 Encounters:  11/25/13 239 lb 6.4 oz (108.591 kg)  10/24/12 241 lb 13.5 oz (109.7 kg)  10/24/12 241 lb 13.5 oz (109.7 kg)     Past Medical History  Diagnosis Date  . Hypertension   . Colitis   . Ulcerative colitis   . Hyperlipidemia LDL goal < 70   . Multiple sclerosis     Bilateral optic neuritis, treated and resolved but felt to be a manifestation of MS.  . Hepatic steatosis     Current Outpatient Prescriptions  Medication Sig Dispense Refill  . aspirin 81 MG chewable tablet Chew 1 tablet (81 mg total) by mouth daily.      Marland Kitchen atorvastatin (LIPITOR) 20 MG tablet Take 1 tablet (20 mg total) by mouth daily at 6 PM.  30 tablet  11  . lisinopril-hydrochlorothiazide (PRINZIDE,ZESTORETIC) 10-12.5 MG per tablet Take 1 tablet by mouth daily.      . Multiple Vitamins-Minerals (MULTIVITAMINS THER. W/MINERALS) TABS Take 1 tablet by mouth daily.      . nitroGLYCERIN (NITROSTAT) 0.4 MG SL tablet Place 1 tablet (0.4 mg total) under the tongue every 5 (five) minutes as  needed for chest pain.  25 tablet  3  . Omega-3 Fatty Acids (FISH OIL PO) Take by mouth 2 (two) times daily.      . Ticagrelor (BRILINTA) 90 MG TABS tablet Take 1 tablet (90 mg total) by mouth 2 (two) times daily.  60 tablet  11   No current facility-administered medications for this visit.    Allergies:   No Known Allergies  Social History:  The patient  reports that he has quit smoking. He does not have any smokeless tobacco history on file. He reports that he drinks alcohol.   ROS:  Please see the history of present illness.   All other systems reviewed and negative.   OBJECTIVE: VS:  BP 138/82  Pulse 53  Ht 6' (1.829 m)  Wt 239 lb 6.4 oz (108.591 kg)  BMI 32.46 kg/m2 Well nourished, well developed, in no acute distress, younger than stated age 50: normal Neck: JVD flat. Carotid bruit 2+ and symmetric  Cardiac:  normal S1, S2; RRR; no murmur Lungs:  clear to auscultation bilaterally, no wheezing, rhonchi or rales Abd: soft, nontender, no hepatomegaly Ext: Edema absent. Pulses 2+ Skin: warm and dry Neuro:  CNs 2-12 intact, no focal abnormalities noted  EKG:  Sinus bradycardia 53 beats per minute. Otherwise normal       Signed, Illene Labrador III, MD 11/25/2013 10:03 AM

## 2013-11-25 NOTE — Patient Instructions (Addendum)
Your physician recommends that you continue on your current medications as directed. Please refer to the Current Medication list given to you today.  STOP Brilinta in July 2015  Your physician recommends that you return for a FASTING lipid profile and lft on 12/01/13 our lab is open form 7:30am -5:30pm  You have been referred to [facility] at [facility] (Dr.Paz)   Your physician wants you to follow-up in: 1 year You will receive a reminder letter in the mail two months in advance. If you don't receive a letter, please call our office to schedule the follow-up appointment.

## 2013-12-01 ENCOUNTER — Other Ambulatory Visit: Payer: BC Managed Care – PPO

## 2013-12-08 ENCOUNTER — Other Ambulatory Visit (INDEPENDENT_AMBULATORY_CARE_PROVIDER_SITE_OTHER): Payer: BC Managed Care – PPO

## 2013-12-08 DIAGNOSIS — E785 Hyperlipidemia, unspecified: Secondary | ICD-10-CM

## 2013-12-08 DIAGNOSIS — Z79899 Other long term (current) drug therapy: Secondary | ICD-10-CM

## 2013-12-08 LAB — LIPID PANEL
Cholesterol: 133 mg/dL (ref 0–200)
HDL: 36.9 mg/dL — ABNORMAL LOW (ref >39.00)
LDL CALC: 71 mg/dL (ref 0–99)
Total CHOL/HDL Ratio: 4
Triglycerides: 126 mg/dL (ref 0.0–149.0)
VLDL: 25.2 mg/dL (ref 0.0–40.0)

## 2013-12-08 LAB — ALT: ALT: 68 U/L — AB (ref 0–53)

## 2013-12-14 ENCOUNTER — Telehealth: Payer: Self-pay | Admitting: Interventional Cardiology

## 2013-12-14 DIAGNOSIS — E785 Hyperlipidemia, unspecified: Secondary | ICD-10-CM

## 2013-12-14 NOTE — Telephone Encounter (Signed)
returned pt call.pt given lab results and Dr.Smith's instructions.Lipids at [facility]. Liver tests abnormal but improved from 1 year ago. Needs full liver panel after no alcohol for 2 weeks.pt will come for repeat alt on 12/28/13.pt agreeable with plan and verbalized understanding.

## 2013-12-14 NOTE — Telephone Encounter (Signed)
New message  Patient returning your call from yesterday. Lab results, please call back.

## 2013-12-28 ENCOUNTER — Other Ambulatory Visit (INDEPENDENT_AMBULATORY_CARE_PROVIDER_SITE_OTHER): Payer: BC Managed Care – PPO

## 2013-12-28 DIAGNOSIS — E785 Hyperlipidemia, unspecified: Secondary | ICD-10-CM

## 2013-12-28 LAB — LIPID PANEL
CHOLESTEROL: 126 mg/dL (ref 0–200)
HDL: 34.3 mg/dL — ABNORMAL LOW (ref >39.00)
LDL Cholesterol: 62 mg/dL (ref 0–99)
Total CHOL/HDL Ratio: 4
Triglycerides: 147 mg/dL (ref 0.0–149.0)
VLDL: 29.4 mg/dL (ref 0.0–40.0)

## 2013-12-28 LAB — ALT: ALT: 53 U/L (ref 0–53)

## 2013-12-30 ENCOUNTER — Telehealth: Payer: Self-pay

## 2013-12-30 DIAGNOSIS — E785 Hyperlipidemia, unspecified: Secondary | ICD-10-CM

## 2013-12-30 NOTE — Telephone Encounter (Signed)
Message copied by Lamar Laundry on Thu Dec 30, 2013 11:02 AM ------      Message from: Daneen Schick      Created: Wed Dec 29, 2013  7:16 AM       Labs inclusing liver are great!      Repeat the same labs in 1 year ------

## 2013-12-30 NOTE — Telephone Encounter (Signed)
pt given lab results.Labs inclusing liver are great!      Repeat the same labs in 1 year.pt verbalized understanding.

## 2014-02-14 ENCOUNTER — Telehealth: Payer: Self-pay

## 2014-02-14 NOTE — Telephone Encounter (Signed)
LM with spouse  New patient No pertinent data

## 2014-02-15 ENCOUNTER — Ambulatory Visit (INDEPENDENT_AMBULATORY_CARE_PROVIDER_SITE_OTHER): Payer: BC Managed Care – PPO | Admitting: Internal Medicine

## 2014-02-15 ENCOUNTER — Encounter: Payer: Self-pay | Admitting: Internal Medicine

## 2014-02-15 VITALS — BP 116/68 | HR 60 | Temp 98.3°F | Resp 18 | Ht 72.0 in | Wt 232.0 lb

## 2014-02-15 DIAGNOSIS — R739 Hyperglycemia, unspecified: Secondary | ICD-10-CM | POA: Insufficient documentation

## 2014-02-15 DIAGNOSIS — R7303 Prediabetes: Secondary | ICD-10-CM

## 2014-02-15 DIAGNOSIS — I10 Essential (primary) hypertension: Secondary | ICD-10-CM

## 2014-02-15 DIAGNOSIS — F1020 Alcohol dependence, uncomplicated: Secondary | ICD-10-CM

## 2014-02-15 DIAGNOSIS — E785 Hyperlipidemia, unspecified: Secondary | ICD-10-CM

## 2014-02-15 DIAGNOSIS — R7309 Other abnormal glucose: Secondary | ICD-10-CM

## 2014-02-15 DIAGNOSIS — I2510 Atherosclerotic heart disease of native coronary artery without angina pectoris: Secondary | ICD-10-CM

## 2014-02-15 MED ORDER — AMOXICILLIN 500 MG PO CAPS: 1000.0000 mg | ORAL_CAPSULE | Freq: Two times a day (BID) | ORAL | Status: DC

## 2014-02-15 NOTE — Telephone Encounter (Signed)
Unable to reach prior to visit  

## 2014-02-15 NOTE — Assessment & Plan Note (Signed)
Good compliance of medication, he  d/c brillanta  April 2015. Currently asymptomatic

## 2014-02-15 NOTE — Assessment & Plan Note (Signed)
The patient admits to have a ETOH problem, currently "doing better", currently does not drink frequently or excessively. I let the patient know that this is life long disease, encouraged him to seek for help at any time if he feels that is needed.

## 2014-02-15 NOTE — Progress Notes (Signed)
Pre-visit discussion using our clinic review tool, as applicable. No additional management support is needed unless otherwise documented below in the visit note.  

## 2014-02-15 NOTE — Progress Notes (Addendum)
Subjective:    Patient ID: Gary [name], male    DOB: 07-26-1956, 58 y.o.   MRN: [id number]  DOS:  02/15/2014 Type of  visit: New patient, here to get established CAD, notes from cardiology reviewed. Good compliance with medication, d/c brillanta  April 2015 Hypertension.Ambulatory BPs are usually very good.Good compliance of medication History of EtOH, reports is "doing better". see assessment and plan 6 months history of a discoloration of the 4th right toenail. Does not recall any injury.  ROS Denies chest pain or difficulty breathing No nausea, vomiting, diarrhea.   Past Medical History  Diagnosis Date  . Hypertension   . Ulcerative colitis   . Hyperlipidemia LDL goal < 70   . Multiple sclerosis 1991    Bilateral optic neuritis, treated and resolved but felt to be a manifestation of MS.  . Hepatic steatosis   . EtOH dependence   . CAD (coronary artery disease) 10-2012    h/o stents     Past Surgical History  Procedure Laterality Date  . Vasectomy    . Foot surgery      R toe Fx (screws),  L dorsal FX (screws)    History   Social History  . Marital Status: Married    Spouse Name: N/A    Number of Children: 1  . Years of Education: N/A   Occupational History  . 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 Smoking status: Former Research scientist (life sciences)  . Smokeless tobacco: Former Systems developer     Comment: 1991  . Alcohol Use: Yes     Comment: h/o abuse, currently has slow done but still drinks sometimes   . Drug Use: No  . Sexual Activity: Yes   Other Topics Concern  . Not on file   Social History Narrative  . No narrative on file     Family History  Problem Relation Age of Onset  . Dementia Mother   . Hypertension Mother   . Coronary artery disease Father   . Diabetes Mellitus II Father   . Coronary artery disease Brother   . Hypertension Brother        Medication List       This list is accurate as of: 02/15/14  6:21 PM.  Always use your most recent med  list.               amoxicillin 500 MG capsule  Commonly known as:  AMOXIL  Take 2 capsules (1,000 mg total) by mouth 2 (two) times daily.     aspirin 81 MG chewable tablet  Chew 1 tablet (81 mg total) by mouth daily.     atorvastatin 20 MG tablet  Commonly known as:  LIPITOR  Take 1 tablet (20 mg total) by mouth daily at 6 PM.     FISH OIL PO  Take by mouth 2 (two) times daily.     lisinopril-hydrochlorothiazide 10-12.5 MG per tablet  Commonly known as:  PRINZIDE,ZESTORETIC  Take 1 tablet by mouth daily.     multivitamins ther. w/minerals Tabs tablet  Take 1 tablet by mouth daily.     nitroGLYCERIN 0.4 MG SL tablet  Commonly known as:  NITROSTAT  Place 1 tablet (0.4 mg total) under the tongue every 5 (five) minutes as needed for chest pain.           Objective:   Physical Exam BP 116/68  Pulse 60  Temp(Src) 98.3 F (36.8 C) (Oral)  Resp 18  Ht  6' (1.829 m)  Wt 232 lb (105.235 kg)  BMI 31.46 kg/m2  SpO2 95% General -- alert, well-developed, NAD.   Lungs -- normal respiratory effort, no intercostal retractions, no accessory muscle use, and normal breath sounds.  Heart-- normal rate, regular rhythm, no murmur.  Abdomen-- Not distended, good bowel sounds,soft, non-tender.  Extremities-- no pretibial edema bilaterally . 4th R toenail-- Dark, seems like a subungual hematoma, skin of the toe is normal Neurologic--  alert & oriented X3. Speech normal, gait normal, strength normal in all extremities.  Psych-- Cognition and judgment appear intact. Cooperative with normal attention span and concentration. No anxious or depressed appearing.      Assessment & Plan:   Will get records from prior PCP as well as from Dr. Cristina Gong, had more than 1 colonoscopies before, the patient thinks is about to be due for a repeat colonoscopy.\ Today , I spent more than 30 min with the patient, >50% of the time counseling,   reviewing the chart  (cards notes)

## 2014-02-15 NOTE — Assessment & Plan Note (Signed)
Well-controlled, check a BMP, refill when necessary

## 2014-02-15 NOTE — Patient Instructions (Addendum)
Get your blood work before you leave   Next visit is for a physical exam in 4-6 months , fasting Please make an appointment     Talk with the front about getting the records from your previous primary MD and Dr Cristina Gong; you will need to sign a release of information

## 2014-02-15 NOTE — Assessment & Plan Note (Signed)
A1c5-2014 was 5.8, slightly elevated, repeat A1c today

## 2014-02-15 NOTE — Assessment & Plan Note (Signed)
Under excellent control

## 2014-02-16 ENCOUNTER — Telehealth: Payer: Self-pay | Admitting: Internal Medicine

## 2014-02-16 LAB — BASIC METABOLIC PANEL
BUN: 13 mg/dL (ref 6–23)
CHLORIDE: 103 meq/L (ref 96–112)
CO2: 24 mEq/L (ref 19–32)
CREATININE: 0.8 mg/dL (ref 0.4–1.5)
Calcium: 9.7 mg/dL (ref 8.4–10.5)
GFR: 127.06 mL/min (ref >60.00)
GLUCOSE: 83 mg/dL (ref 70–99)
Potassium: 4 mEq/L (ref 3.5–5.1)
Sodium: 138 mEq/L (ref 135–145)

## 2014-02-16 LAB — HEMOGLOBIN A1C: HEMOGLOBIN A1C: 5.5 % (ref 4.6–6.5)

## 2014-02-16 NOTE — Telephone Encounter (Signed)
Relevant patient education mailed to patient.  

## 2014-02-18 ENCOUNTER — Encounter: Payer: Self-pay | Admitting: *Deleted

## 2014-04-27 ENCOUNTER — Other Ambulatory Visit: Payer: Self-pay | Admitting: Interventional Cardiology

## 2014-05-04 ENCOUNTER — Ambulatory Visit: Payer: BC Managed Care – PPO | Admitting: Medical

## 2014-05-05 ENCOUNTER — Encounter: Payer: Self-pay | Admitting: Medical

## 2014-05-05 ENCOUNTER — Telehealth: Payer: Self-pay | Admitting: Internal Medicine

## 2014-05-05 ENCOUNTER — Ambulatory Visit (INDEPENDENT_AMBULATORY_CARE_PROVIDER_SITE_OTHER): Payer: BC Managed Care – PPO | Admitting: Medical

## 2014-05-05 VITALS — BP 124/69 | HR 65 | Temp 98.0°F | Wt 232.0 lb

## 2014-05-05 DIAGNOSIS — F411 Generalized anxiety disorder: Secondary | ICD-10-CM

## 2014-05-05 MED ORDER — CLONAZEPAM 0.5 MG PO TABS: 0.5000 mg | ORAL_TABLET | Freq: Two times a day (BID) | ORAL | Status: DC | PRN

## 2014-05-05 MED ORDER — SERTRALINE HCL 25 MG PO TABS: 25.0000 mg | ORAL_TABLET | Freq: Every day | ORAL | Status: DC

## 2014-05-05 MED ORDER — ATORVASTATIN CALCIUM 20 MG PO TABS: ORAL_TABLET | ORAL | Status: DC

## 2014-05-05 MED ORDER — LISINOPRIL-HYDROCHLOROTHIAZIDE 10-12.5 MG PO TABS: ORAL_TABLET | ORAL | Status: DC

## 2014-05-05 NOTE — Telephone Encounter (Signed)
Caller name: Tim Relation to pt: Call back [place]: [employer].   Reason for call:  Pt is need the Rx clonazePAM (KLONOPIN) 0.5 MG tablet faxed over to the pharmacy if we can.  Please call when completed.

## 2014-05-05 NOTE — Progress Notes (Signed)
   Subjective:    Patient ID: Gary [name], male    DOB: 1955/11/26, 58 y.o.   MRN: [id number]  HPI  Pt in states with work he is feeling stressed and anxious. He has tried therapist/counselor various times in past. Pt also exercises 3 times a week. He states periodic episodes of anxiety and stress over 4 years. Pt states never has been on medications. In the past he  did not want to be on meds. Now he is ready to try some medications. This stress and anxiety is about 5 days a week and related to work. 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         Review of Systems  Constitutional: Negative.  Negative for fever, chills and fatigue.  Respiratory: Negative for cough and wheezing.   Cardiovascular: Negative for chest pain and palpitations.  Neurological: Negative for dizziness, seizures, syncope and light-headedness.  Hematological: Negative for adenopathy. Does not bruise/bleed easily.  Psychiatric/Behavioral: Negative for suicidal ideas, behavioral problems, confusion, sleep disturbance, self-injury, dysphoric mood and agitation. The patient is nervous/anxious.        No homicidal ideations expressed as well.         Objective:   Physical Exam  Constitutional: He is oriented to person, place, and time. He appears well-developed and well-nourished. No distress.  HENT:  Head: Normocephalic and atraumatic.  Neck: Normal range of motion. Neck supple. No thyromegaly present.  Cardiovascular: Normal rate, regular rhythm and normal heart sounds.   Pulmonary/Chest: Effort normal and breath sounds normal. No respiratory distress. He has no wheezes. He has no rales. He exhibits no tenderness.  Neurological: He is alert and oriented to person, place, and time.  Skin: He is not diaphoretic.  Psychiatric: Judgment and thought content normal.  He seems to be moderately anxious presently.           Assessment & Plan:

## 2014-05-05 NOTE — Telephone Encounter (Signed)
Called in to pharmacy. Pt notified.

## 2014-05-05 NOTE — Addendum Note (Signed)
Addended by: Peggyann Shoals on: 05/05/2014 05:11 PM   Modules accepted: Orders

## 2014-05-05 NOTE — Assessment & Plan Note (Signed)
Moderate anxiety 5 days a week. Some days worse than other. Pt has struggled with this for years and failed conservative measures. Rx sertraline today. Rx clonazepam for acute/severe anxiety.(Rx advisement on both meds.)

## 2014-05-05 NOTE — Patient Instructions (Signed)
   For your anxiety I am prescribing sertraline 25 mg. I sent prescription to your pharmacy. If you have severe  anxiety attack could use clonazapam. But I am writing small number of clonazapam for acute severe anxiety/panic attacks.(I am stressing rare/limited use of clonazapam) Sertraline will be used for daily treatment. Follow up in one month or prn.

## 2014-05-05 NOTE — Progress Notes (Signed)
Pre visit review using our clinic review tool, if applicable. No additional management support is needed unless otherwise documented below in the visit note. 

## 2014-05-26 ENCOUNTER — Telehealth: Payer: Self-pay | Admitting: Medical

## 2014-05-26 MED ORDER — SERTRALINE HCL 25 MG PO TABS: 25.0000 mg | ORAL_TABLET | Freq: Every day | ORAL | Status: DC

## 2014-05-26 NOTE — Telephone Encounter (Signed)
Caller name: Tim Relation to pt: self  Call back number: [phone] Pharmacy: [facility] [phone]   Reason for call: requesting refill sertraline (ZOLOFT) 25 MG tablet pt has an upcoming CPE appt 07/08/14. Last seen 05/05/14 for anxiety

## 2014-05-26 NOTE — Telephone Encounter (Signed)
Refill sent.

## 2014-06-12 ENCOUNTER — Telehealth: Payer: Self-pay | Admitting: Internal Medicine

## 2014-06-12 NOTE — Telephone Encounter (Signed)
Several pages of medical records reviewed, many repetitive. Only relevant will be scanned:  Notes from PCP   2013 --ultrasound of the liver was done, "abnormal", no report, Subsequently he saw GI Note from GI 2013: Elevated LFTs, history of negative hepatitis  B, and C serology, slightly elevated ferritin. AST was 70, ALT 155. Diagnosis was fatty liver Question of ulcerative proctitis at some point , had a colonoscopy (~ 2011?) showed adenomatous polyps

## 2014-06-30 ENCOUNTER — Telehealth: Payer: Self-pay | Admitting: Internal Medicine

## 2014-06-30 NOTE — Telephone Encounter (Signed)
Call pt and see how he is doing with his mood. He had some anxiety.  I rx'd sertraline 25 mg q day. If he is doing well would you go ahead and refill his prescription for 30 days. No refills. He will see Dr. Larose Kells in near future. Please document that he feels well before refilling(Any question let me know). If he not sure if med working or any side effect let me know. Then I need to talk with him.    Also, regarding his concern about Dr. Larose Kells stopping his med or switching. I can't say what he will do. But if it working real well for him and he has no side effects, then chance of med change is minimal.  Let me know how pt is. Thanks.

## 2014-06-30 NOTE — Telephone Encounter (Signed)
Pt would like to speak with you regarding the rx sertraline (ZOLOFT) 25 MG tablet Pt saw you on 7/30 for anxiety issues, states you had informed him that he could try the meds and then increase if necessary. Pt has an appointment with dr. Larose Kells on 10/2 for his CPE and he only has 4 pills left. Pt wants your advice on what to do, does not want to get a new rx and then dr. Larose Kells stops the meds.

## 2014-07-01 NOTE — Telephone Encounter (Signed)
Called patient to see how he is doing as far as Mood is concerned. Left message on answering machine for call back if possible before phones off. Advised if he is feeling well his Sertraline can be refilled for another 30 days.Will call patient back on Monday if he does not return call.

## 2014-07-04 ENCOUNTER — Other Ambulatory Visit: Payer: Self-pay

## 2014-07-04 MED ORDER — SERTRALINE HCL 25 MG PO TABS: 25.0000 mg | ORAL_TABLET | Freq: Every day | ORAL | Status: DC

## 2014-07-04 NOTE — Telephone Encounter (Signed)
Patient returned call states he is feeling better using there Sertaline. Will see Dr. Larose Kells soon and hopeful he will continue medication.

## 2014-07-04 NOTE — Telephone Encounter (Signed)
Called patient again. Left message for call back.

## 2014-07-08 ENCOUNTER — Encounter: Payer: Self-pay | Admitting: Internal Medicine

## 2014-07-08 ENCOUNTER — Ambulatory Visit (INDEPENDENT_AMBULATORY_CARE_PROVIDER_SITE_OTHER): Payer: BC Managed Care – PPO | Admitting: Internal Medicine

## 2014-07-08 VITALS — BP 159/82 | HR 62 | Temp 98.3°F | Ht 73.0 in | Wt 229.1 lb

## 2014-07-08 DIAGNOSIS — Z23 Encounter for immunization: Secondary | ICD-10-CM

## 2014-07-08 DIAGNOSIS — K5190 Ulcerative colitis, unspecified, without complications: Secondary | ICD-10-CM

## 2014-07-08 DIAGNOSIS — I10 Essential (primary) hypertension: Secondary | ICD-10-CM

## 2014-07-08 DIAGNOSIS — R945 Abnormal results of liver function studies: Secondary | ICD-10-CM

## 2014-07-08 DIAGNOSIS — R319 Hematuria, unspecified: Secondary | ICD-10-CM

## 2014-07-08 DIAGNOSIS — F419 Anxiety disorder, unspecified: Secondary | ICD-10-CM

## 2014-07-08 DIAGNOSIS — R7989 Other specified abnormal findings of blood chemistry: Secondary | ICD-10-CM

## 2014-07-08 DIAGNOSIS — F1020 Alcohol dependence, uncomplicated: Secondary | ICD-10-CM

## 2014-07-08 DIAGNOSIS — Z0000 Encounter for general adult medical examination without abnormal findings: Secondary | ICD-10-CM

## 2014-07-08 LAB — URINALYSIS, ROUTINE W REFLEX MICROSCOPIC
BILIRUBIN URINE: NEGATIVE
Ketones, ur: NEGATIVE
Leukocytes, UA: NEGATIVE
Nitrite: NEGATIVE
PH: 7 (ref 5.0–8.0)
Specific Gravity, Urine: <=1.005 — AB (ref 1.000–1.030)
TOTAL PROTEIN, URINE-UPE24: NEGATIVE
Urine Glucose: NEGATIVE
Urobilinogen, UA: 0.2 (ref 0.0–1.0)
WBC, UA: NONE SEEN (ref 0–?)

## 2014-07-08 LAB — CBC WITH DIFFERENTIAL/PLATELET
Basophils Absolute: 0 10*3/uL (ref 0.0–0.1)
Basophils Relative: 0.7 % (ref 0.0–3.0)
EOS ABS: 0.1 10*3/uL (ref 0.0–0.7)
EOS PCT: 2.4 % (ref 0.0–5.0)
HCT: 45.5 % (ref 39.0–52.0)
Hemoglobin: 15.3 g/dL (ref 13.0–17.0)
LYMPHS PCT: 47.8 % — AB (ref 12.0–46.0)
Lymphs Abs: 2.2 10*3/uL (ref 0.7–4.0)
MCHC: 33.6 g/dL (ref 30.0–36.0)
MCV: 87 fl (ref 78.0–100.0)
MONO ABS: 0.3 10*3/uL (ref 0.1–1.0)
Monocytes Relative: 5.8 % (ref 3.0–12.0)
NEUTROS PCT: 43.3 % (ref 43.0–77.0)
Neutro Abs: 2 10*3/uL (ref 1.4–7.7)
PLATELETS: 176 10*3/uL (ref 150.0–400.0)
RBC: 5.23 Mil/uL (ref 4.22–5.81)
RDW: 13.1 % (ref 11.5–15.5)
WBC: 4.7 10*3/uL (ref 4.0–10.5)

## 2014-07-08 LAB — COMPREHENSIVE METABOLIC PANEL
ALK PHOS: 71 U/L (ref 39–117)
ALT: 48 U/L (ref 0–53)
AST: 33 U/L (ref 0–37)
Albumin: 4.5 g/dL (ref 3.5–5.2)
BUN: 12 mg/dL (ref 6–23)
CALCIUM: 9.2 mg/dL (ref 8.4–10.5)
CO2: 24 meq/L (ref 19–32)
Chloride: 101 mEq/L (ref 96–112)
Creatinine, Ser: 0.8 mg/dL (ref 0.4–1.5)
GFR: 136.56 mL/min (ref >60.00)
Glucose, Bld: 97 mg/dL (ref 70–99)
POTASSIUM: 4 meq/L (ref 3.5–5.1)
SODIUM: 134 meq/L — AB (ref 135–145)
TOTAL PROTEIN: 8.1 g/dL (ref 6.0–8.3)
Total Bilirubin: 1 mg/dL (ref 0.2–1.2)

## 2014-07-08 LAB — TSH: TSH: 2.75 u[IU]/mL (ref 0.35–4.50)

## 2014-07-08 LAB — T3, FREE: T3 FREE: 2.7 pg/mL (ref 2.3–4.2)

## 2014-07-08 LAB — T4, FREE: FREE T4: 0.73 ng/dL (ref 0.60–1.60)

## 2014-07-08 MED ORDER — SERTRALINE HCL 25 MG PO TABS: 25.0000 mg | ORAL_TABLET | Freq: Every day | ORAL | Status: DC

## 2014-07-08 NOTE — Assessment & Plan Note (Signed)
BP today is slightly elevated, usually normal. Recommend the same medications and check ambulatory BPs

## 2014-07-08 NOTE — Progress Notes (Signed)
Subjective:    Patient ID: Gary [name], male    DOB: Feb 02, 1956, 58 y.o.   MRN: [id number]  DOS:  07/08/2014 Type of visit - description : cpx Interval history: Since her last visit he is feeling well.  ROS Denies chest pain, difficulty breathing, lower extremity edema No nausea, vomiting, diarrhea. No dysuria or difficulty urinating. Had an episode of gross hematuria a month ago. See assessment and plan.  Past Medical History  Diagnosis Date  . Hypertension   . Ulcerative colitis   . Hyperlipidemia LDL goal < 70   . Multiple sclerosis 1991    Bilateral optic neuritis, treated and resolved but felt to be a manifestation of MS.  . Hepatic steatosis   . EtOH dependence   . CAD (coronary artery disease) 10-2012    h/o stents     Past Surgical History  Procedure Laterality Date  . Vasectomy    . Foot surgery      R toe Fx (screws),  L dorsal FX (screws)    History   Social History  . Marital Status: Married    Spouse Name: N/A    Number of Children: 1  . Years of Education: N/A   Occupational History  . 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 Smoking status: Former Research scientist (life sciences)  . Smokeless tobacco: Former Systems developer     Comment: 1991  . Alcohol Use: Yes     Comment: h/o abuse, currently has slow done but still drinks sometimes   . Drug Use: No  . Sexual Activity: Yes   Other Topics Concern  . Not on file   Social History Narrative   Household-- pt and wife   Has an adult son     Family History  Problem Relation Age of Onset  . Dementia Mother   . Hypertension Mother   . Coronary artery disease Father   . Diabetes Mellitus II Father   . Coronary artery disease Brother   . Hypertension Brother        Medication List       This list is accurate as of: 07/08/14 11:59 PM.  Always use your most recent med list.               aspirin 81 MG chewable tablet  Chew 1 tablet (81 mg total) by mouth daily.     atorvastatin 20 MG tablet  Commonly  known as:  LIPITOR  take 1 tablet by mouth once daily     clonazePAM 0.5 MG tablet  Commonly known as:  KLONOPIN  Take 1 tablet (0.5 mg total) by mouth 2 (two) times daily as needed for anxiety.     FISH OIL PO  Take by mouth 2 (two) times daily.     lisinopril-hydrochlorothiazide 10-12.5 MG per tablet  Commonly known as:  PRINZIDE,ZESTORETIC  take 1 tablet by mouth once daily     multivitamins ther. w/minerals Tabs tablet  Take 1 tablet by mouth daily.     nitroGLYCERIN 0.4 MG SL tablet  Commonly known as:  NITROSTAT  Place 1 tablet (0.4 mg total) under the tongue every 5 (five) minutes as needed for chest pain.     sertraline 25 MG tablet  Commonly known as:  ZOLOFT  Take 1 tablet (25 mg total) by mouth daily.           Objective:   Physical Exam BP 159/82  Pulse 62  Temp(Src) 98.3 F (36.8 C) (Oral)  Ht 6' 1"  (1.854 m)  Wt 229 lb 2 oz (103.93 kg)  BMI 30.24 kg/m2  SpO2 97%  General -- alert, well-developed, NAD.  Neck --no thyromegaly , normal carotid pulse  HEENT-- Not pale.  Lungs -- normal respiratory effort, no intercostal retractions, no accessory muscle use, and normal breath sounds.  Heart-- normal rate, regular rhythm, no murmur.  Abdomen-- Not distended, good bowel sounds,soft, non-tender.  Extremities-- no pretibial edema bilaterally  Neurologic--  alert & oriented X3. Speech normal, gait appropriate for age, strength symmetric and appropriate for age.  Psych-- Cognition and judgment appear intact. Cooperative with normal attention span and concentration. No anxious or depressed appearing.       Assessment & Plan:

## 2014-07-08 NOTE — Patient Instructions (Signed)
Get your blood work before you leave     Please come back to the office in 4-5 months  for a routine check up  , no  fasting    Stop by the front desk and schedule the visit     Check the  blood pressure 2 or 3 times a  week be sure it is between 110/60 and 140/85. Ideal blood pressure is 120/80. If it is consistently higher or lower, let me know

## 2014-07-08 NOTE — Assessment & Plan Note (Addendum)
Last TD? Flu shot today Pneumonia shot today Question of ulcerative proctitis at some point , had a colonoscopy (~ 2011?) showed adenomatous polyps--- Refer back to GI. Is likely due for a colonoscopy. Has seen urology within a year for a hematuria workup, did have pain this episode of hematuria a month  ago. Plan: Get records, if he has further episodes of hematuria needs to see urology again. Labs Diet and exercise discussed

## 2014-07-08 NOTE — Assessment & Plan Note (Signed)
History of gross hematuria, saw urology Dr Janice Norrie within the last year, reports a cystoscopy, rectal exam et Ronney Asters. We will get records

## 2014-07-08 NOTE — Assessment & Plan Note (Signed)
Currently drinking sporadically and in moderation

## 2014-07-08 NOTE — Assessment & Plan Note (Signed)
GI records were reviewed: Question of ulcerative proctitis at some point , had a colonoscopy (~ 2011?) showed adenomatous polyps

## 2014-07-08 NOTE — Progress Notes (Signed)
Pre visit review using our clinic review tool, if applicable. No additional management support is needed unless otherwise documented below in the visit note. 

## 2014-07-08 NOTE — Assessment & Plan Note (Signed)
Notes from PCP 2013 --ultrasound of the liver was done, "abnormal", no report, Subsequently he saw GI  Note from GI 2013:  Elevated LFTs, history of negative hepatitis B, and C serology, slightly elevated ferritin. AST was 70, ALT 155.  Diagnosis was fatty liver

## 2014-07-08 NOTE — Assessment & Plan Note (Signed)
Was recently started on a  Zoloft, was prescribed clonazepam few tablets, at this point he is feeling great, has not used clonazepam except for one time. Plan: Refill SSRIs, no UDS at this point Reassess in 4-5 months

## 2014-07-09 LAB — URINE CULTURE: Colony Count: 2000

## 2014-07-18 ENCOUNTER — Telehealth: Payer: Self-pay

## 2014-07-18 NOTE — Telephone Encounter (Signed)
UDS: 07/08/2014  Negative for Klonopin Positive for Sertraline   Low risk per Dr. Larose Kells 07/18/2014

## 2014-07-19 ENCOUNTER — Telehealth: Payer: Self-pay | Admitting: Internal Medicine

## 2014-07-19 NOTE — Telephone Encounter (Signed)
LMOM about lab results from 10/2. Informed him that a letter had been sent to his home address stating that all labs were normal.

## 2014-07-19 NOTE — Telephone Encounter (Signed)
Caller name: [name] Relation to pt: self  Call back number: [phone]   Reason for call: pt inquiring about lab results taken 07/08/14

## 2014-07-22 ENCOUNTER — Encounter: Payer: Self-pay | Admitting: Internal Medicine

## 2014-08-08 ENCOUNTER — Telehealth: Payer: Self-pay | Admitting: Internal Medicine

## 2014-08-08 NOTE — Telephone Encounter (Signed)
Caller name: tim Relation to pt: self Call back number: [phone] Pharmacy:  Reason for call:   Patient wants to know if we received medical records release form last week. To get records from Dr. Janice Norrie. Ok to leave detailed message.

## 2014-08-09 NOTE — Telephone Encounter (Signed)
Form was sent to [facility]. JG//CMA

## 2014-08-30 ENCOUNTER — Other Ambulatory Visit: Payer: Self-pay

## 2014-08-30 ENCOUNTER — Telehealth: Payer: Self-pay | Admitting: Internal Medicine

## 2014-08-30 ENCOUNTER — Other Ambulatory Visit: Payer: Self-pay | Admitting: *Deleted

## 2014-08-30 MED ORDER — SERTRALINE HCL 25 MG PO TABS: 25.0000 mg | ORAL_TABLET | Freq: Every day | ORAL | Status: DC

## 2014-08-30 MED ORDER — NITROGLYCERIN 0.4 MG SL SUBL: 0.4000 mg | SUBLINGUAL_TABLET | SUBLINGUAL | Status: DC | PRN

## 2014-08-30 NOTE — Telephone Encounter (Signed)
Caller name: Eryc, Bodey Relation to LU:NGBM  Call back [place]: [facility] [phone]   Reason for call:  Pt requesting a refill of sertraline (ZOLOFT) 25 MG tablet

## 2014-08-30 NOTE — Telephone Encounter (Signed)
Medication refilled

## 2014-09-06 ENCOUNTER — Other Ambulatory Visit: Payer: Self-pay

## 2014-09-08 ENCOUNTER — Telehealth: Payer: Self-pay | Admitting: Internal Medicine

## 2014-09-08 NOTE — Telephone Encounter (Signed)
Received 12 pages from [facility] sent to Dr. Larose Kells. 09/08/14/ss

## 2014-09-15 ENCOUNTER — Encounter (HOSPITAL_COMMUNITY): Payer: Self-pay | Admitting: Interventional Cardiology

## 2014-09-16 NOTE — Telephone Encounter (Signed)
Records reviewed. Saw urology twice in 2014 ref gross hematuria. CT scan showed renal cysts, cystoscopy negative, he had atypical urothelial cells present in a urinalysis, a fish UroVysion test was negative. Plan was to see urology again by October 2014 for a MRI. He was recently here and reported an additional episodes of hematuria. Please call the patient, needs to see urology again, arrange a referral.

## 2014-09-19 NOTE — Telephone Encounter (Signed)
LMOM for Pt to return call.

## 2014-09-26 NOTE — Telephone Encounter (Signed)
Have been unable to contact Pt, letter printed and mailed to Pt informing him to call office at his earliest convenience.

## 2014-10-20 ENCOUNTER — Ambulatory Visit (INDEPENDENT_AMBULATORY_CARE_PROVIDER_SITE_OTHER): Payer: BLUE CROSS/BLUE SHIELD | Admitting: Medical

## 2014-10-20 ENCOUNTER — Encounter: Payer: Self-pay | Admitting: Medical

## 2014-10-20 ENCOUNTER — Ambulatory Visit (HOSPITAL_BASED_OUTPATIENT_CLINIC_OR_DEPARTMENT_OTHER)
Admission: RE | Admit: 2014-10-20 | Discharge: 2014-10-20 | Disposition: A | Discharge status: Home / Self Care | Payer: BLUE CROSS/BLUE SHIELD | Source: Ambulatory Visit | Attending: Medical | Admitting: Medical

## 2014-10-20 ENCOUNTER — Telehealth: Payer: Self-pay | Admitting: Medical

## 2014-10-20 VITALS — BP 131/79 | HR 56 | Temp 98.4°F | Ht 73.0 in | Wt 235.4 lb

## 2014-10-20 DIAGNOSIS — M25561 Pain in right knee: Principal | ICD-10-CM

## 2014-10-20 DIAGNOSIS — G8929 Other chronic pain: Secondary | ICD-10-CM

## 2014-10-20 MED ORDER — MELOXICAM 7.5 MG PO TABS: 7.5000 mg | ORAL_TABLET | Freq: Every day | ORAL | Status: DC

## 2014-10-20 NOTE — Progress Notes (Signed)
Subjective:    Patient ID: Gary [name], male    DOB: [birth year], 59 y.o.   MRN: [id number]  HPI   Pt states bilateral knee pain. But a lot worse on his left knee. Knee swells easily with activity. Pain is in the front and not in the back. Pain for a couple of months.  Lt knee arthroscopy in past and patellar tendinitis.  Pt not aware of any work up/xray of his rt knee.  Pt not taking anything for the pain other than occasional aspirin or full aspirin.(I advised pt not to use)  Today is good day not much pain at all.  Knee does feel stiff. No other arthralgias.  Past Medical History  Diagnosis Date  . Hypertension   . Ulcerative colitis   . Hyperlipidemia LDL goal < 70   . Multiple sclerosis 1991    Bilateral optic neuritis, treated and resolved but felt to be a manifestation of MS.  . Hepatic steatosis   . EtOH dependence   . CAD (coronary artery disease) 10-2012    h/o stents     History   Social History  . Marital Status: Married    Spouse Name: N/A    Number of Children: 1  . Years of Education: N/A   Occupational History  . 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 Smoking status: Former Research scientist (life sciences)  . Smokeless tobacco: Former Systems developer     Comment: 1991  . Alcohol Use: Yes     Comment: h/o abuse, currently has slow done but still drinks sometimes   . Drug Use: No  . Sexual Activity: Yes   Other Topics Concern  . Not on file   Social History Narrative   Household-- pt and wife   Has an adult son    Past Surgical History  Procedure Laterality Date  . Vasectomy    . Foot surgery      R toe Fx (screws),  L dorsal FX (screws)  . Left heart catheterization with coronary angiogram N/A 10/22/2012    Procedure: LEFT HEART CATHETERIZATION WITH CORONARY ANGIOGRAM;  Surgeon: Sinclair Grooms, MD;  Location: [facility] CATH LAB;  Service: Cardiovascular;  Laterality: N/A;  . Percutaneous coronary stent intervention (pci-s) N/A 10/23/2012    Procedure: PERCUTANEOUS  CORONARY STENT INTERVENTION (PCI-S);  Surgeon: Sinclair Grooms, MD;  Location: [facility] CATH LAB;  Service: Cardiovascular;  Laterality: N/A;    Family History  Problem Relation Age of Onset  . Dementia Mother   . Hypertension Mother   . Coronary artery disease Father   . Diabetes Mellitus II Father   . Coronary artery disease Brother   . Hypertension Brother     No Known Allergies  Current Outpatient Prescriptions on File Prior to Visit  Medication Sig Dispense Refill  . aspirin 81 MG chewable tablet Chew 1 tablet (81 mg total) by mouth daily.    Marland Kitchen atorvastatin (LIPITOR) 20 MG tablet take 1 tablet by mouth once daily 30 tablet 6  . clonazePAM (KLONOPIN) 0.5 MG tablet Take 1 tablet (0.5 mg total) by mouth 2 (two) times daily as needed for anxiety. 8 tablet 0  . lisinopril-hydrochlorothiazide (PRINZIDE,ZESTORETIC) 10-12.5 MG per tablet take 1 tablet by mouth once daily 30 tablet 6  . Multiple Vitamins-Minerals (MULTIVITAMINS THER. W/MINERALS) TABS Take 1 tablet by mouth daily.    . nitroGLYCERIN (NITROSTAT) 0.4 MG SL tablet Place 1 tablet (0.4 mg total) under the tongue every 5 (five)  minutes as needed for chest pain. 25 tablet 3  . Omega-3 Fatty Acids (FISH OIL PO) Take by mouth 2 (two) times daily.    . sertraline (ZOLOFT) 25 MG tablet Take 1 tablet (25 mg total) by mouth daily. 90 tablet 1   No current facility-administered medications on file prior to visit.    BP 131/79 mmHg  Pulse 56  Temp(Src) 98.4 F (36.9 C) (Oral)  Ht 6' 1"  (1.854 m)  Wt 235 lb 6.4 oz (106.777 kg)  BMI 31.06 kg/m2  SpO2 94%        Review of Systems  Constitutional: Negative for fever, chills and fatigue.  Respiratory: Negative for cough, chest tightness, shortness of breath and wheezing.   Cardiovascular: Negative for chest pain and palpitations.  Gastrointestinal: Negative for abdominal pain.  Musculoskeletal: Negative for back pain.       Rt knee pain mostly. Mild left knee pain. Going up  steps is quite painful. Going up steps level 5 pain.       Objective:   Physical Exam   General- No acute distress. Pleasant patient. Lungs- Clear, even and unlabored. Heart- regular rate and rhythm. Neurologic- CNII- XII grossly intact.  Lt knee- mild-moderate crepitus. No swelling. No instability. No effusion presently. Neg homans sign. Calf not swollen  Rt knee-mild faint  crepitus. No swelling. No instability. No effusion presently. Neg homans sign. Calf not swollen         Assessment & Plan:

## 2014-10-20 NOTE — Assessment & Plan Note (Signed)
I will get xray of your rt knee today and make meloxicam low dose available for days that are painful. Pain could be soft tissue but I do want to assess the joint space.   Based on xray will determine if maybe you need ortho referral.  Follow up in 2-3 wks or as needed.

## 2014-10-20 NOTE — Progress Notes (Signed)
Pre visit review using our clinic review tool, if applicable. No additional management support is needed unless otherwise documented below in the visit note. 

## 2014-10-20 NOTE — Telephone Encounter (Signed)
Refer to ortho for knee pain.

## 2014-10-20 NOTE — Patient Instructions (Addendum)
I will get xray of your rt knee today and make meloxicam low dose available for days that are painful. Pain could be soft tissue but I do want to assess the joint space.   Based on xray will determine if maybe you need ortho referral.  Follow up in 2-3 wks or as needed.

## 2014-10-26 ENCOUNTER — Ambulatory Visit: Payer: BLUE CROSS/BLUE SHIELD | Admitting: Internal Medicine

## 2014-11-29 ENCOUNTER — Encounter: Payer: Self-pay | Admitting: Interventional Cardiology

## 2014-11-29 ENCOUNTER — Ambulatory Visit (INDEPENDENT_AMBULATORY_CARE_PROVIDER_SITE_OTHER): Payer: BLUE CROSS/BLUE SHIELD | Admitting: Interventional Cardiology

## 2014-11-29 VITALS — BP 132/78 | HR 50 | Ht 73.0 in | Wt 241.0 lb

## 2014-11-29 DIAGNOSIS — I10 Essential (primary) hypertension: Secondary | ICD-10-CM

## 2014-11-29 DIAGNOSIS — I2510 Atherosclerotic heart disease of native coronary artery without angina pectoris: Secondary | ICD-10-CM

## 2014-11-29 NOTE — Patient Instructions (Signed)
Your physician recommends that you continue on your current medications as directed. Please refer to the Current Medication list given to you today.  Your physician wants you to follow-up in: 1 year with Dr.Smith You will receive a reminder letter in the mail two months in advance. If you don't receive a letter, please call our office to schedule the follow-up appointment.  

## 2014-11-29 NOTE — Progress Notes (Signed)
Cardiology Office Note   Date:  11/29/2014   ID:  Gary [name], DOB 1956-07-09, MRN [id number]  PCP:  [name], MD  Cardiologist:   Sinclair Grooms, MD   No chief complaint on file.     History of Present Illness: Gary [name] is a 59 y.o. male who presents for CAD. Has history of LAD stents in 2014. Overall he is doing well. He denies angina, palpitations, and syncope. He is not able to run anymore because of bilateral patella degeneration and arthritis.    Past Medical History  Diagnosis Date  . Hypertension   . Ulcerative colitis   . Hyperlipidemia LDL goal < 70   . Multiple sclerosis 1991    Bilateral optic neuritis, treated and resolved but felt to be a manifestation of MS.  . Hepatic steatosis   . EtOH dependence   . CAD (coronary artery disease) 10-2012    h/o stents     Past Surgical History  Procedure Laterality Date  . Vasectomy    . Foot surgery      R toe Fx (screws),  L dorsal FX (screws)  . Left heart catheterization with coronary angiogram N/A 10/22/2012    Procedure: LEFT HEART CATHETERIZATION WITH CORONARY ANGIOGRAM;  Surgeon: Sinclair Grooms, MD;  Location: [facility] CATH LAB;  Service: Cardiovascular;  Laterality: N/A;  . Percutaneous coronary stent intervention (pci-s) N/A 10/23/2012    Procedure: PERCUTANEOUS CORONARY STENT INTERVENTION (PCI-S);  Surgeon: Sinclair Grooms, MD;  Location: [facility] CATH LAB;  Service: Cardiovascular;  Laterality: N/A;     Current Outpatient Prescriptions  Medication Sig Dispense Refill  . aspirin 81 MG chewable tablet Chew 1 tablet (81 mg total) by mouth daily.    Marland Kitchen atorvastatin (LIPITOR) 20 MG tablet take 1 tablet by mouth once daily 30 tablet 6  . clonazePAM (KLONOPIN) 0.5 MG tablet Take 1 tablet (0.5 mg total) by mouth 2 (two) times daily as needed for anxiety. 8 tablet 0  . lisinopril-hydrochlorothiazide (PRINZIDE,ZESTORETIC) 10-12.5 MG per tablet take 1 tablet by mouth once daily 30 tablet 6  . meloxicam (MOBIC) 7.5 MG  tablet Take 1 tablet (7.5 mg total) by mouth daily. 30 tablet 0  . Multiple Vitamins-Minerals (MULTIVITAMINS THER. W/MINERALS) TABS Take 1 tablet by mouth daily.    . nitroGLYCERIN (NITROSTAT) 0.4 MG SL tablet Place 1 tablet (0.4 mg total) under the tongue every 5 (five) minutes as needed for chest pain. 25 tablet 3  . Omega-3 Fatty Acids (FISH OIL PO) Take by mouth 2 (two) times daily.    . sertraline (ZOLOFT) 25 MG tablet Take 1 tablet (25 mg total) by mouth daily. 90 tablet 1   No current facility-administered medications for this visit.    Allergies:   Review of patient's allergies indicates no known allergies.    Social History:  The patient  reports that he has quit smoking. He has quit using smokeless tobacco. He reports that he drinks alcohol. He reports that he does not use illicit drugs.   Family History:  The patient's family history includes Coronary artery disease in his brother and father; Dementia in his mother; Diabetes Mellitus II in his father; Hypertension in his brother and mother.    ROS:  Please see the history of present illness.   Otherwise, review of systems are positive for none.   All other systems are reviewed and negative.    PHYSICAL EXAM: VS:  BP 132/78 mmHg  Pulse 50  Ht 6' 1"  (1.854 m)  Wt 241 lb (109.317 kg)  BMI 31.80 kg/m2 , BMI Body mass index is 31.8 kg/(m^2). GEN: Well nourished, well developed, in no acute distress HEENT: normal Neck: no JVD, carotid bruits, or masses Cardiac: RRR; no murmurs, rubs, or gallops,no edema  Respiratory:  clear to auscultation bilaterally, normal work of breathing GI: soft, nontender, nondistended, + BS MS: no deformity or atrophy Skin: warm and dry, no rash Neuro:  Strength and sensation are intact Psych: euthymic mood, full affect   EKG:  EKG is ordered today. The ekg ordered today demonstrates sinus bradycardia. Otherwise normal.   Recent Labs: 07/08/2014: ALT 48; BUN 12; Creatinine 0.8; Hemoglobin 15.3;  Platelets 176.0; Potassium 4.0; Sodium 134*; TSH 2.75    Lipid Panel    Component Value Date/Time   CHOL 126 12/28/2013 0742   TRIG 147.0 12/28/2013 0742   HDL 34.30* 12/28/2013 0742   CHOLHDL 4 12/28/2013 0742   VLDL 29.4 12/28/2013 0742   LDLCALC 62 12/28/2013 0742      Wt Readings from Last 3 Encounters:  11/29/14 241 lb (109.317 kg)  10/20/14 235 lb 6.4 oz (106.777 kg)  07/08/14 229 lb 2 oz (103.93 kg)      Other studies Reviewed: Additional studies/ records that were reviewed today include: .   ASSESSMENT AND PLAN:  1.  Coronary artery disease with prior stents, asymptomatic with reference to angina. 2. Essential hypertension with excellent control 3. Hyperlipidemia followed by primary care and said to be at goal.   Current medicines are reviewed at length with the patient today.  The patient has concerns regarding medicines.  The following changes have been made:  no change  Labs/ tests ordered today include: none  No orders of the defined types were placed in this encounter.     Disposition:   FU with Linard Millers in 1 Year   Signed, Sinclair Grooms, MD  11/29/2014 8:30 AM    [address], [place], [place]  [address] Phone: [phone]; Fax: [phone]

## 2014-11-30 ENCOUNTER — Telehealth: Payer: Self-pay | Admitting: Internal Medicine

## 2014-11-30 MED ORDER — LISINOPRIL-HYDROCHLOROTHIAZIDE 10-12.5 MG PO TABS: ORAL_TABLET | ORAL | Status: DC

## 2014-11-30 MED ORDER — CLONAZEPAM 0.5 MG PO TABS: 0.5000 mg | ORAL_TABLET | Freq: Two times a day (BID) | ORAL | Status: DC | PRN

## 2014-11-30 MED ORDER — ATORVASTATIN CALCIUM 20 MG PO TABS: ORAL_TABLET | ORAL | Status: DC

## 2014-11-30 MED ORDER — SERTRALINE HCL 25 MG PO TABS: 25.0000 mg | ORAL_TABLET | Freq: Every day | ORAL | Status: DC

## 2014-11-30 NOTE — Telephone Encounter (Signed)
To my knowledge, he takes clonazepam quite sporadically, will prescribe #10 no refills

## 2014-11-30 NOTE — Telephone Encounter (Signed)
[facility] home delivery fax # [phone] patient will be changing to this for his home delivery.  clonaze .87m  Lisinopril  hydrochlorizide 10-12.5 lipitor 20 mg  Sertraline 255m Please send these to mail order

## 2014-11-30 NOTE — Telephone Encounter (Signed)
Script faxed to [facility] successfully. JG//CMA

## 2014-11-30 NOTE — Telephone Encounter (Signed)
Pt is requesting refill on Clonazepam.  Last OV: 07/08/2014 Last Fill: 05/05/2014 # [id number] UDS: 07/18/2014 Low risk  Please advise.    Lisinopril, Sertraline, and Lipitor sent to [facility] (# 90 and 1 refills).

## 2014-12-05 ENCOUNTER — Telehealth: Payer: Self-pay | Admitting: Internal Medicine

## 2014-12-05 NOTE — Telephone Encounter (Signed)
Contacted PT back RE  appt- informed fasting appt

## 2014-12-05 NOTE — Telephone Encounter (Signed)
Preferrably, yes he will need to be Fasting, Dr. Larose Kells will more than likely check Lipid panel (cholesterol) which he will need to be fasting for.

## 2014-12-05 NOTE — Telephone Encounter (Signed)
Caller name: Byrd Rushlow Relationship to patient: Self [phone]  Reason for call: PT wanting to know if follow up appointment on 3/2 is fasting  - no direction stated in appointment notes.

## 2014-12-07 ENCOUNTER — Ambulatory Visit (INDEPENDENT_AMBULATORY_CARE_PROVIDER_SITE_OTHER): Payer: BLUE CROSS/BLUE SHIELD | Admitting: Internal Medicine

## 2014-12-07 ENCOUNTER — Encounter: Payer: Self-pay | Admitting: Internal Medicine

## 2014-12-07 VITALS — BP 126/64 | HR 64 | Temp 98.1°F | Ht 73.0 in | Wt 241.1 lb

## 2014-12-07 DIAGNOSIS — I2510 Atherosclerotic heart disease of native coronary artery without angina pectoris: Secondary | ICD-10-CM

## 2014-12-07 DIAGNOSIS — F419 Anxiety disorder, unspecified: Secondary | ICD-10-CM

## 2014-12-07 DIAGNOSIS — I10 Essential (primary) hypertension: Secondary | ICD-10-CM

## 2014-12-07 DIAGNOSIS — R319 Hematuria, unspecified: Secondary | ICD-10-CM

## 2014-12-07 LAB — LIPID PANEL
CHOL/HDL RATIO: 4
Cholesterol: 137 mg/dL (ref 0–200)
HDL: 35.5 mg/dL — ABNORMAL LOW (ref >39.00)
LDL CALC: 71 mg/dL (ref 0–99)
NonHDL: 101.5
Triglycerides: 152 mg/dL — ABNORMAL HIGH (ref 0.0–149.0)
VLDL: 30.4 mg/dL (ref 0.0–40.0)

## 2014-12-07 NOTE — Assessment & Plan Note (Signed)
Notes from urology 01-2013 reviewed: CT show a renal cyst, cystoscopy negative, they recommended follow-up MRI for the renal cyst.

## 2014-12-07 NOTE — Progress Notes (Signed)
Pre visit review using our clinic review tool, if applicable. No additional management support is needed unless otherwise documented below in the visit note. 

## 2014-12-07 NOTE — Patient Instructions (Signed)
Get your blood work before you leave     Please go to the front and schedule a visit  By October 2016 for a physical exam     Come back fasting

## 2014-12-07 NOTE — Assessment & Plan Note (Signed)
Controlled, last BMP satisfactory

## 2014-12-07 NOTE — Assessment & Plan Note (Signed)
Well-controlled on sertraline, uses clonazepam very rarely. UDS 07-2014 low risk, will sign a contract today

## 2014-12-07 NOTE — Assessment & Plan Note (Signed)
Asymptomatic, check FLP today. Continue aspirin, Lipitor, lisinopril

## 2014-12-07 NOTE — Progress Notes (Signed)
Subjective:    Patient ID: Gary [name], male    DOB: [birth year], 59 y.o.   MRN: [id number]  DOS:  12/07/2014 Type of visit - description : rov Interval history: CAD, saw cardiology, felt to be stable. Hypertension, good compliance w/  medication, no apparent side effects. Anxiety, on SSRIs, good  compliance, good control of symptoms, taking clonazepam very rarely. History of EtOH, drinking socially and in moderation Knee pain, saw orthopedic surgery, taking meloxicam as needed.   Review of Systems  No chest pain or difficulty breathing No nausea vomiting  Past Medical History  Diagnosis Date  . Hypertension   . Ulcerative colitis   . Hyperlipidemia LDL goal < 70   . Multiple sclerosis 1991    Bilateral optic neuritis, treated and resolved but felt to be a manifestation of MS.  . Hepatic steatosis   . EtOH dependence   . CAD (coronary artery disease) 10-2012    h/o stents     Past Surgical History  Procedure Laterality Date  . Vasectomy    . Foot surgery      R toe Fx (screws),  L dorsal FX (screws)  . Left heart catheterization with coronary angiogram N/A 10/22/2012    Procedure: LEFT HEART CATHETERIZATION WITH CORONARY ANGIOGRAM;  Surgeon: Sinclair Grooms, MD;  Location: [facility] CATH LAB;  Service: Cardiovascular;  Laterality: N/A;  . Percutaneous coronary stent intervention (pci-s) N/A 10/23/2012    Procedure: PERCUTANEOUS CORONARY STENT INTERVENTION (PCI-S);  Surgeon: Sinclair Grooms, MD;  Location: [facility] CATH LAB;  Service: Cardiovascular;  Laterality: N/A;    History   Social History  . Marital Status: Married    Spouse Name: N/A  . Number of Children: 1  . Years of Education: N/A   Occupational History  . 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 Smoking status: Former Research scientist (life sciences)  . Smokeless tobacco: Former Systems developer     Comment: 1991  . Alcohol Use: Yes     Comment: h/o abuse, currently has slow done but still drinks sometimes   . Drug Use: No  . Sexual  Activity: Yes   Other Topics Concern  . Not on file   Social History Narrative   Household-- pt and wife   Has an adult son        Medication List       This list is accurate as of: 12/07/14  9:04 AM.  Always use your most recent med list.               aspirin 81 MG chewable tablet  Chew 1 tablet (81 mg total) by mouth daily.     atorvastatin 20 MG tablet  Commonly known as:  LIPITOR  take 1 tablet by mouth once daily     clonazePAM 0.5 MG tablet  Commonly known as:  KLONOPIN  Take 1 tablet (0.5 mg total) by mouth 2 (two) times daily as needed for anxiety.     FISH OIL PO  Take by mouth 2 (two) times daily.     lisinopril-hydrochlorothiazide 10-12.5 MG per tablet  Commonly known as:  PRINZIDE,ZESTORETIC  take 1 tablet by mouth once daily     meloxicam 7.5 MG tablet  Commonly known as:  MOBIC  Take 1 tablet (7.5 mg total) by mouth daily.     multivitamins ther. w/minerals Tabs tablet  Take 1 tablet by mouth daily.     nitroGLYCERIN 0.4 MG SL tablet  Commonly  known as:  NITROSTAT  Place 1 tablet (0.4 mg total) under the tongue every 5 (five) minutes as needed for chest pain.     sertraline 25 MG tablet  Commonly known as:  ZOLOFT  Take 1 tablet (25 mg total) by mouth daily.           Objective:   Physical Exam BP 126/64 mmHg  Pulse 64  Temp(Src) 98.1 F (36.7 C) (Oral)  Ht 6' 1"  (1.854 m)  Wt 241 lb 2 oz (109.374 kg)  BMI 31.82 kg/m2  SpO2 98% General:   Well developed, well nourished . NAD.  HEENT:  Normocephalic . Face symmetric, atraumatic Lungs:  CTA B Normal respiratory effort, no intercostal retractions, no accessory muscle use. Heart: RRR,  no murmur.  Muscle skeletal: no pretibial edema bilaterally  Skin: Not pale. Not jaundice Neurologic:  alert & oriented X3.  Speech normal, gait appropriate for age and unassisted Psych--  Cognition and judgment appear intact.  Cooperative with normal attention span and concentration.    Behavior appropriate. No anxious or depressed appearing.      Assessment & Plan:

## 2015-05-25 ENCOUNTER — Other Ambulatory Visit: Payer: Self-pay

## 2015-05-25 ENCOUNTER — Telehealth: Payer: Self-pay | Admitting: Internal Medicine

## 2015-05-25 MED ORDER — SERTRALINE HCL 25 MG PO TABS: 25.0000 mg | ORAL_TABLET | Freq: Every day | ORAL | Status: DC

## 2015-05-25 MED ORDER — ATORVASTATIN CALCIUM 20 MG PO TABS: 20.0000 mg | ORAL_TABLET | Freq: Every day | ORAL | Status: DC

## 2015-05-25 MED ORDER — LISINOPRIL-HYDROCHLOROTHIAZIDE 10-12.5 MG PO TABS: 1.0000 | ORAL_TABLET | Freq: Every day | ORAL | Status: DC

## 2015-05-25 NOTE — Telephone Encounter (Signed)
Rx's sent to [facility] as requested.

## 2015-05-25 NOTE — Telephone Encounter (Signed)
Relation to pt: self  Call back number: [phone] Pharmacy: [place], [place] - [place] [phone] (Phone) [phone] (Fax)        Reason for call:  Patient requesting a refill of the following medication:  sertraline (ZOLOFT) 25 MG tablet  atorvastatin (LIPITOR) 20 MG tablet  lisinopril-hydrochlorothiazide (PRINZIDE,ZESTORETIC) 10-12.5 MG per tablet

## 2015-06-01 LAB — HM COLONOSCOPY

## 2015-06-20 ENCOUNTER — Encounter: Payer: Self-pay | Admitting: Internal Medicine

## 2015-07-11 ENCOUNTER — Telehealth: Payer: Self-pay | Admitting: Behavioral Health

## 2015-07-11 ENCOUNTER — Encounter: Payer: Self-pay | Admitting: Behavioral Health

## 2015-07-11 NOTE — Telephone Encounter (Signed)
Pre-Visit Call completed with patient and chart updated.   Pre-Visit Info documented in Specialty Comments under [facility].    

## 2015-07-11 NOTE — Telephone Encounter (Signed)
Unable to reach patient at time of Pre-Visit Call.  Left message for patient to return call when available.    

## 2015-07-11 NOTE — Addendum Note (Signed)
Addended by: Kathlen Brunswick on: 07/11/2015 04:33 PM   Modules accepted: Medications

## 2015-07-12 ENCOUNTER — Ambulatory Visit (INDEPENDENT_AMBULATORY_CARE_PROVIDER_SITE_OTHER): Payer: BLUE CROSS/BLUE SHIELD | Admitting: Internal Medicine

## 2015-07-12 ENCOUNTER — Encounter: Payer: Self-pay | Admitting: Internal Medicine

## 2015-07-12 VITALS — BP 112/64 | HR 54 | Temp 98.1°F | Ht 72.0 in | Wt 236.4 lb

## 2015-07-12 DIAGNOSIS — Z0000 Encounter for general adult medical examination without abnormal findings: Secondary | ICD-10-CM

## 2015-07-12 DIAGNOSIS — Z114 Encounter for screening for human immunodeficiency virus [HIV]: Secondary | ICD-10-CM

## 2015-07-12 DIAGNOSIS — Z09 Encounter for follow-up examination after completed treatment for conditions other than malignant neoplasm: Secondary | ICD-10-CM

## 2015-07-12 DIAGNOSIS — N4289 Other specified disorders of prostate: Secondary | ICD-10-CM

## 2015-07-12 DIAGNOSIS — R7303 Prediabetes: Secondary | ICD-10-CM

## 2015-07-12 DIAGNOSIS — Z1159 Encounter for screening for other viral diseases: Secondary | ICD-10-CM

## 2015-07-12 LAB — CBC WITH DIFFERENTIAL/PLATELET
BASOS ABS: 0 10*3/uL (ref 0.0–0.1)
Basophils Relative: 0.8 % (ref 0.0–3.0)
EOS ABS: 0.1 10*3/uL (ref 0.0–0.7)
Eosinophils Relative: 1.7 % (ref 0.0–5.0)
HEMATOCRIT: 43.7 % (ref 39.0–52.0)
Hemoglobin: 14.8 g/dL (ref 13.0–17.0)
LYMPHS ABS: 2.1 10*3/uL (ref 0.7–4.0)
LYMPHS PCT: 37 % (ref 12.0–46.0)
MCHC: 33.8 g/dL (ref 30.0–36.0)
MCV: 86.7 fl (ref 78.0–100.0)
MONOS PCT: 7.4 % (ref 3.0–12.0)
Monocytes Absolute: 0.4 10*3/uL (ref 0.1–1.0)
NEUTROS PCT: 53.1 % (ref 43.0–77.0)
Neutro Abs: 3 10*3/uL (ref 1.4–7.7)
Platelets: 162 10*3/uL (ref 150.0–400.0)
RBC: 5.04 Mil/uL (ref 4.22–5.81)
RDW: 13.2 % (ref 11.5–15.5)
WBC: 5.6 10*3/uL (ref 4.0–10.5)

## 2015-07-12 LAB — LIPID PANEL
CHOL/HDL RATIO: 4
Cholesterol: 137 mg/dL (ref 0–200)
HDL: 37.7 mg/dL — ABNORMAL LOW (ref >39.00)
LDL CALC: 70 mg/dL (ref 0–99)
NONHDL: 98.93
TRIGLYCERIDES: 143 mg/dL (ref 0.0–149.0)
VLDL: 28.6 mg/dL (ref 0.0–40.0)

## 2015-07-12 LAB — COMPREHENSIVE METABOLIC PANEL
ALBUMIN: 4.5 g/dL (ref 3.5–5.2)
ALK PHOS: 77 U/L (ref 39–117)
ALT: 61 U/L — AB (ref 0–53)
AST: 33 U/L (ref 0–37)
BILIRUBIN TOTAL: 0.9 mg/dL (ref 0.2–1.2)
BUN: 10 mg/dL (ref 6–23)
CALCIUM: 9.4 mg/dL (ref 8.4–10.5)
CO2: 27 mEq/L (ref 19–32)
Chloride: 100 mEq/L (ref 96–112)
Creatinine, Ser: 0.73 mg/dL (ref 0.40–1.50)
GFR: 142.53 mL/min (ref >60.00)
Glucose, Bld: 99 mg/dL (ref 70–99)
Potassium: 4 mEq/L (ref 3.5–5.1)
Sodium: 136 mEq/L (ref 135–145)
TOTAL PROTEIN: 7.4 g/dL (ref 6.0–8.3)

## 2015-07-12 LAB — URINALYSIS, ROUTINE W REFLEX MICROSCOPIC
BILIRUBIN URINE: NEGATIVE
Hgb urine dipstick: NEGATIVE
KETONES UR: NEGATIVE
Leukocytes, UA: NEGATIVE
Nitrite: NEGATIVE
PH: 7 (ref 5.0–8.0)
RBC / HPF: NONE SEEN (ref 0–?)
Specific Gravity, Urine: 1.01 (ref 1.000–1.030)
Total Protein, Urine: NEGATIVE
URINE GLUCOSE: NEGATIVE
UROBILINOGEN UA: 0.2 (ref 0.0–1.0)
WBC UA: NONE SEEN (ref 0–?)

## 2015-07-12 LAB — PSA

## 2015-07-12 LAB — HEMOGLOBIN A1C: Hgb A1c MFr Bld: 5.4 % (ref 4.6–6.5)

## 2015-07-12 MED ORDER — PERMETHRIN 5 % EX CREA: 1.0000 "application " | TOPICAL_CREAM | Freq: Once | CUTANEOUS | Status: DC

## 2015-07-12 NOTE — Progress Notes (Signed)
Subjective:    Patient ID: Gary [name], male    DOB: 17-Nov-1955, 59 y.o.   MRN: [id number]  DOS:  07/12/2015 Type of visit - description : CPX Interval history: good compliance with medication.    Review of Systems Constitutional: No fever. No chills. No unexplained wt changes. No unusual sweats  HEENT: No dental problems, no ear discharge, no facial swelling, no voice changes. No eye discharge, no eye  redness , no  intolerance to light . Occasionally sees some excessive wax from the right  Respiratory: No wheezing , no  difficulty breathing. No cough , no mucus production  Cardiovascular: No CP, no leg swelling , no  Palpitations  GI: no nausea, no vomiting, no diarrhea , no  abdominal pain.  No blood in the stools. No dysphagia, no odynophagia    Endocrine: No polyphagia, no polyuria , no polydipsia  GU: Urinary urgency occasionally, no gross hematuria, difficulty urinating, dysuria.  Musculoskeletal: No joint swellings or unusual aches or pains  Skin: No change in the color of the skin, palor, occasional rash after working in the yard, permethrin lotion helps, request a prescription. Allergic, immunologic: No environmental allergies , no  food allergies  Neurological: No dizziness no  syncope. No headaches. No diplopia, no slurred, no slurred speech, no motor deficits, no facial  Numbness  Hematological: No enlarged lymph nodes, no easy bruising , no unusual bleedings  Psychiatry: No suicidal ideas, no hallucinations, no beavior problems, no confusion.  No unusual/severe anxiety, no depression   Past Medical History  Diagnosis Date  . Hypertension   . Ulcerative colitis ([place])   . Hyperlipidemia LDL goal < 70   . Multiple sclerosis ([place]) 1991    Bilateral optic neuritis, treated and resolved but felt to be a manifestation of MS.  . Hepatic steatosis   . EtOH dependence ([place])   . CAD (coronary artery disease) 10-2012    h/o stents     Past Surgical History    Procedure Laterality Date  . Vasectomy    . Foot surgery      R toe Fx (screws),  L dorsal FX (screws)  . Left heart catheterization with coronary angiogram N/A 10/22/2012    Procedure: LEFT HEART CATHETERIZATION WITH CORONARY ANGIOGRAM;  Surgeon: Sinclair Grooms, MD;  Location: [facility] CATH LAB;  Service: Cardiovascular;  Laterality: N/A;  . Percutaneous coronary stent intervention (pci-s) N/A 10/23/2012    Procedure: PERCUTANEOUS CORONARY STENT INTERVENTION (PCI-S);  Surgeon: Sinclair Grooms, MD;  Location: [facility] CATH LAB;  Service: Cardiovascular;  Laterality: N/A;    Social History   Social History  . Marital Status: Married    Spouse Name: N/A  . Number of Children: 1  . Years of Education: N/A   Occupational History  . 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 Smoking status: Former Research scientist (life sciences)  . Smokeless tobacco: Former Systems developer     Comment: 1991  . Alcohol Use: Yes     Comment: h/o abuse, currently has slow done but still drinks sometimes   . Drug Use: No  . Sexual Activity: Yes   Other Topics Concern  . Not on file   Social History Narrative   Household-- pt and wife   Has an adult son     Family History  Problem Relation Age of Onset  . Dementia Mother   . Hypertension Mother   . Coronary artery disease Father   . Diabetes Mellitus  II Father   . Coronary artery disease Brother   . Hypertension Brother        Medication List       This list is accurate as of: 07/12/15  9:09 PM.  Always use your most recent med list.               aspirin 81 MG chewable tablet  Chew 1 tablet (81 mg total) by mouth daily.     atorvastatin 20 MG tablet  Commonly known as:  LIPITOR  Take 1 tablet (20 mg total) by mouth daily.     clonazePAM 0.5 MG tablet  Commonly known as:  KLONOPIN  Take 1 tablet (0.5 mg total) by mouth 2 (two) times daily as needed for anxiety.     FISH OIL PO  Take by mouth 2 (two) times daily.     lisinopril-hydrochlorothiazide 10-12.5  MG tablet  Commonly known as:  PRINZIDE,ZESTORETIC  Take 1 tablet by mouth daily.     meloxicam 7.5 MG tablet  Commonly known as:  MOBIC  Take 1 tablet (7.5 mg total) by mouth daily.     multivitamins ther. w/minerals Tabs tablet  Take 1 tablet by mouth daily.     nitroGLYCERIN 0.4 MG SL tablet  Commonly known as:  NITROSTAT  Place 1 tablet (0.4 mg total) under the tongue every 5 (five) minutes as needed for chest pain.     permethrin 5 % cream  Commonly known as:  ELIMITE  Apply 1 application topically once.     sertraline 25 MG tablet  Commonly known as:  ZOLOFT  Take 1 tablet (25 mg total) by mouth daily.           Objective:   Physical Exam BP 112/64 mmHg  Pulse 54  Temp(Src) 98.1 F (36.7 C) (Oral)  Ht 6' (1.829 m)  Wt 236 lb 6 oz (107.219 kg)  BMI 32.05 kg/m2  SpO2 97% General:   Well developed, well nourished . NAD.  HEENT:  Normocephalic . Face symmetric, atraumatic Neck: No thyromegaly Lungs:  CTA B Normal respiratory effort, no intercostal retractions, no accessory muscle use. Heart: RRR,  no murmur.  no pretibial edema bilaterally  Abdomen:  Not distended, soft, non-tender. No rebound or rigidity Rectal:  External abnormalities: none. Normal sphincter tone. No rectal masses or tenderness.  No stools found Prostate: Prostate is asymmetric, R side enlarged and y harder . Not nodular or tender. Neurologic:  alert & oriented X3.  Speech normal, gait appropriate for age and unassisted Psych--  Cognition and judgment appear intact.  Cooperative with normal attention span and concentration.  Behavior appropriate. No anxious or depressed appearing.    Assessment & Plan:   Assessment > Pre-diabetes HTN Hyperlipidemia CAD s/p stents 2014  EtOH dependency Ulcerative colitis Hematuria, saw urology 01-2013 reviewed: CT show a renal cyst, cystoscopy negative, they recommended follow-up MRI for the renal cyst. MS  Dx 1991 (B neuritis, rx, resolved,  felt to be d/t MS)  Plan HTN: Seems well-controlled Prediabetes: Check A1c Hematuria, renal cyst: Was supposed to have an MRI of the kidney, i don't know if that happened but today we are referring him back to urology due to a asymmetric prostate.  Rash- uses Elimite sporadically, for a rash after working the yard. Will provide prescription to use only once or twice a year RTC 6 months

## 2015-07-12 NOTE — Assessment & Plan Note (Signed)
Td 2015, [pnm 23-2015; prevnar-- out of supplies, rec to get at next opportunity Had a flu shot  S/p colonoscopy (~ 2011?) showed adenomatous polyps Cscope 05-2015: mild erythema, no polyps, no bx, next 5 years  Prostate cancer screening: Prostate is asymmetric, check a PSA and refer back to urology. Labs-CMP, CBC, A1c, PSA, UA, HIV, hep C Diet and exercise discussed

## 2015-07-12 NOTE — Assessment & Plan Note (Signed)
HTN: Seems well-controlled Prediabetes: Check A1c Hematuria, renal cyst: Was supposed to have an MRI of the kidney, i don't know if that happened but today we are referring him back to urology due to a asymmetric prostate.  Rash- uses Elimite sporadically, for a rash after working the yard. Will provide prescription to use only once or twice a year RTC 6 months

## 2015-07-12 NOTE — Progress Notes (Signed)
Pre visit review using our clinic review tool, if applicable. No additional management support is needed unless otherwise documented below in the visit note. 

## 2015-07-12 NOTE — Patient Instructions (Signed)
Get your blood work before you leave      Next visit  for a routine checkup in 6 months, fasting    (15 minutes) Please schedule an appointment at the front desk

## 2015-07-13 ENCOUNTER — Encounter: Payer: Self-pay | Admitting: Internal Medicine

## 2015-07-13 ENCOUNTER — Telehealth: Payer: Self-pay | Admitting: Internal Medicine

## 2015-07-13 LAB — HIV ANTIBODY (ROUTINE TESTING W REFLEX): HIV: NONREACTIVE

## 2015-07-13 LAB — HEPATITIS C ANTIBODY: HCV AB: NEGATIVE

## 2015-07-13 NOTE — Telephone Encounter (Signed)
FYI

## 2015-07-13 NOTE — Telephone Encounter (Signed)
Caller name:Padraig Lovena Le  Relationship to patient: Self   Can be reached: [phone]  Reason for call: Pt says that Dr. Larose Kells called him, he is returning his call.

## 2015-07-14 NOTE — Telephone Encounter (Signed)
Discuss PSA with him, patient to call if he does not hear from urology in few weeks.

## 2015-07-18 ENCOUNTER — Other Ambulatory Visit: Payer: Self-pay | Admitting: Internal Medicine

## 2015-07-19 ENCOUNTER — Encounter: Payer: Self-pay | Admitting: Internal Medicine

## 2015-09-07 HISTORY — PX: PROSTATE BIOPSY: SHX241

## 2015-09-19 ENCOUNTER — Other Ambulatory Visit: Payer: Self-pay | Admitting: Urology

## 2015-09-19 DIAGNOSIS — C61 Malignant neoplasm of prostate: Secondary | ICD-10-CM

## 2015-09-27 ENCOUNTER — Encounter (HOSPITAL_COMMUNITY)
Admission: RE | Admit: 2015-09-27 | Discharge: 2015-09-27 | Disposition: A | Discharge status: Home / Self Care | Payer: BLUE CROSS/BLUE SHIELD | Source: Ambulatory Visit | Attending: Urology | Admitting: Urology

## 2015-09-27 DIAGNOSIS — C61 Malignant neoplasm of prostate: Secondary | ICD-10-CM | POA: Diagnosis not present

## 2015-09-27 MED ORDER — FLUDEOXYGLUCOSE F - 18 (FDG) INJECTION
26.4000 | Freq: Once | INTRAVENOUS | Status: AC | PRN
  Administered 2015-09-27: 26.4 via INTRAVENOUS

## 2015-10-08 DIAGNOSIS — C7982 Secondary malignant neoplasm of genital organs: Secondary | ICD-10-CM

## 2015-10-08 HISTORY — DX: Secondary malignant neoplasm of genital organs: C79.82

## 2015-10-10 ENCOUNTER — Encounter: Payer: Self-pay | Admitting: Internal Medicine

## 2015-10-10 ENCOUNTER — Other Ambulatory Visit: Payer: Self-pay | Admitting: Internal Medicine

## 2015-10-10 MED ORDER — AZELASTINE HCL 0.1 % NA SOLN: 2.0000 | Freq: Every evening | NASAL | Status: DC | PRN

## 2015-10-11 ENCOUNTER — Encounter: Payer: Self-pay | Admitting: Internal Medicine

## 2015-10-11 ENCOUNTER — Encounter: Payer: Self-pay | Admitting: Interventional Cardiology

## 2015-10-24 ENCOUNTER — Other Ambulatory Visit: Payer: Self-pay | Admitting: Urology

## 2015-10-25 ENCOUNTER — Encounter: Payer: Self-pay | Admitting: Internal Medicine

## 2015-11-22 ENCOUNTER — Encounter (HOSPITAL_COMMUNITY)
Admission: RE | Admit: 2015-11-22 | Discharge: 2015-11-22 | Disposition: A | Discharge status: Home / Self Care | Payer: BLUE CROSS/BLUE SHIELD | Source: Ambulatory Visit | Attending: Urology | Admitting: Urology

## 2015-11-22 ENCOUNTER — Encounter (HOSPITAL_COMMUNITY): Payer: Self-pay

## 2015-11-22 HISTORY — DX: Family history of other specified conditions: Z84.89

## 2015-11-22 HISTORY — DX: Anxiety disorder, unspecified: F41.9

## 2015-11-22 LAB — BASIC METABOLIC PANEL
Anion gap: 9 (ref 5–15)
BUN: 12 mg/dL (ref 6–20)
CHLORIDE: 104 mmol/L (ref 101–111)
CO2: 26 mmol/L (ref 22–32)
Calcium: 9.3 mg/dL (ref 8.9–10.3)
Creatinine, Ser: 0.75 mg/dL (ref 0.61–1.24)
GFR calc Af Amer: >60 mL/min (ref >60)
GFR calc non Af Amer: >60 mL/min (ref >60)
GLUCOSE: 88 mg/dL (ref 65–99)
POTASSIUM: 4.3 mmol/L (ref 3.5–5.1)
Sodium: 139 mmol/L (ref 135–145)

## 2015-11-22 LAB — CBC
HEMATOCRIT: 42.9 % (ref 39.0–52.0)
HEMOGLOBIN: 14.1 g/dL (ref 13.0–17.0)
MCH: 28.4 pg (ref 26.0–34.0)
MCHC: 32.9 g/dL (ref 30.0–36.0)
MCV: 86.5 fL (ref 78.0–100.0)
PLATELETS: 162 10*3/uL (ref 150–400)
RBC: 4.96 MIL/uL (ref 4.22–5.81)
RDW: 13.4 % (ref 11.5–15.5)
WBC: 5.2 10*3/uL (ref 4.0–10.5)

## 2015-11-22 LAB — ABO/RH: ABO/RH(D): O POS

## 2015-11-22 NOTE — Patient Instructions (Signed)
Gary [name]  11/22/2015   Your procedure is scheduled on: Friday 11/24/2015  Report to [facility] Main  Entrance take [place]  elevators to 3rd floor to  [place] at  [place] AM.  Call this number if you have problems the morning of surgery [phone]   Remember: ONLY 1 PERSON MAY GO WITH YOU TO SHORT STAY TO GET  READY MORNING OF [place].   Do not eat food  :After Midnight on Wednesday 11/22/2015. FOLLOW BOWEL PREP INSTRUCTIONS FROM DR. MANNY'S OFFICE ON Thursday 11/23/2015-AND FOLLOW A CLEAR LIQUID DIET ALL DAY ON Thursday 11/23/2015 TIL MIDNIGHT. SEE CLEAR LIQUID LIST BELOW!  NOTHING TO DRINK AFTER MIDNIGHT!     Take these medicines the morning of surgery with A SIP OF WATER: ZOLOFT, USE ASTELIN NASAL SPRAY IF NEEDED                                 You may not have any metal on your body including hair pins and              piercings  Do not wear jewelry, make-up, lotions, powders or perfumes, deodorant             Do not wear nail polish.  Do not shave  48 hours prior to surgery.              Men may shave face and neck.   Do not bring valuables to the hospital. [place].  Contacts, dentures or bridgework may not be worn into surgery.  Leave suitcase in the car. After surgery it may be brought to your room.                   Please read over the following fact sheets you were given: _____________________________________________________________________             [facility] - Preparing for Surgery Before surgery, you can play an important role.  Because skin is not sterile, your skin needs to be as free of germs as possible.  You can reduce the number of germs on your skin by washing with CHG (chlorahexidine gluconate) soap before surgery.  CHG is an antiseptic cleaner which kills germs and bonds with the skin to continue killing germs even after washing. Please DO NOT use if you  have an allergy to CHG or antibacterial soaps.  If your skin becomes reddened/irritated stop using the CHG and inform your nurse when you arrive at Short Stay. Do not shave (including legs and underarms) for at least 48 hours prior to the first CHG shower.  You may shave your face/neck. Please follow these instructions carefully:  1.  Shower with CHG Soap the night before surgery and the  morning of Surgery.  2.  If you choose to wash your hair, wash your hair first as usual with your  normal  shampoo.  3.  After you shampoo, rinse your hair and body thoroughly to remove the  shampoo.                           4.  Use CHG as you would any other liquid soap.  You  can apply chg directly  to the skin and wash                       Gently with a scrungie or clean washcloth.  5.  Apply the CHG Soap to your body ONLY FROM THE NECK DOWN.   Do not use on face/ open                           Wound or open sores. Avoid contact with eyes, ears mouth and genitals (private parts).                       Wash face,  Genitals (private parts) with your normal soap.             6.  Wash thoroughly, paying special attention to the area where your surgery  will be performed.  7.  Thoroughly rinse your body with warm water from the neck down.  8.  DO NOT shower/wash with your normal soap after using and rinsing off  the CHG Soap.                9.  Pat yourself dry with a clean towel.            10.  Wear clean pajamas.            11.  Place clean sheets on your bed the night of your first shower and do not  sleep with pets. Day of Surgery : Do not apply any lotions/deodorants the morning of surgery.  Please wear clean clothes to the hospital/surgery center.  FAILURE TO FOLLOW THESE INSTRUCTIONS MAY RESULT IN THE CANCELLATION OF YOUR SURGERY PATIENT SIGNATURE_________________________________  NURSE  SIGNATURE__________________________________  ________________________________________________________________________   Gary [name]  An incentive spirometer is a tool that can help keep your lungs clear and active. This tool measures how well you are filling your lungs with each breath. Taking long deep breaths may help reverse or decrease the chance of developing breathing (pulmonary) problems (especially infection) following:  A long period of time when you are unable to move or be active. BEFORE THE PROCEDURE   If the spirometer includes an indicator to show your best effort, your nurse or respiratory therapist will set it to a desired goal.  If possible, sit up straight or lean slightly forward. Try not to slouch.  Hold the incentive spirometer in an upright position. INSTRUCTIONS FOR USE   Sit on the edge of your bed if possible, or sit up as far as you can in bed or on a chair.  Hold the incentive spirometer in an upright position.  Breathe out normally.  Place the mouthpiece in your mouth and seal your lips tightly around it.  Breathe in slowly and as deeply as possible, raising the piston or the ball toward the top of the column.  Hold your breath for 3-5 seconds or for as long as possible. Allow the piston or ball to fall to the bottom of the column.  Remove the mouthpiece from your mouth and breathe out normally.  Rest for a few seconds and repeat Steps 1 through 7 at least 10 times every 1-2 hours when you are awake. Take your time and take a few normal breaths between deep breaths.  The spirometer may include an indicator to show your best effort. Use the indicator as a goal to work toward during  each repetition.  After each set of 10 deep breaths, practice coughing to be sure your lungs are clear. If you have an incision (the cut made at the time of surgery), support your incision when coughing by placing a pillow or rolled up towels firmly against it. Once  you are able to get out of bed, walk around indoors and cough well. You may stop using the incentive spirometer when instructed by your caregiver.  RISKS AND COMPLICATIONS  Take your time so you do not get dizzy or light-headed.  If you are in pain, you may need to take or ask for pain medication before doing incentive spirometry. It is harder to take a deep breath if you are having pain. AFTER USE  Rest and breathe slowly and easily.  It can be helpful to keep track of a log of your progress. Your caregiver can provide you with a simple table to help with this. If you are using the spirometer at home, follow these instructions: [place] IF:   You are having difficultly using the spirometer.  You have trouble using the spirometer as often as instructed.  Your pain medication is not giving enough relief while using the spirometer.  You develop fever of 100.5 F (38.1 C) or higher. SEEK IMMEDIATE MEDICAL CARE IF:   You cough up bloody sputum that had not been present before.  You develop fever of 102 F (38.9 C) or greater.  You develop worsening pain at or near the incision site. MAKE SURE YOU:   Understand these instructions.  Will watch your condition.  Will get help right away if you are not doing well or get worse. Document Released: 02/03/2007 Document Revised: 12/16/2011 Document Reviewed: 04/06/2007 ExitCare Patient Information 2014 ExitCare, [place].   ________________________________________________________________________  WHAT IS A BLOOD TRANSFUSION? Blood Transfusion Information  A transfusion is the replacement of blood or some of its parts. Blood is made up of multiple cells which provide different functions.  Red blood cells carry oxygen and are used for blood loss replacement.  White blood cells fight against infection.  Platelets control bleeding.  Plasma helps clot blood.  Other blood products are available for specialized needs, such as  hemophilia or other clotting disorders. BEFORE THE TRANSFUSION  Who gives blood for transfusions?   Healthy volunteers who are fully evaluated to make sure their blood is safe. This is blood bank blood. Transfusion therapy is the safest it has ever been in the practice of medicine. Before blood is taken from a donor, a complete history is taken to make sure that person has no history of diseases nor engages in risky social behavior (examples are intravenous drug use or sexual activity with multiple partners). The donor's travel history is screened to minimize risk of transmitting infections, such as malaria. The donated blood is tested for signs of infectious diseases, such as HIV and hepatitis. The blood is then tested to be sure it is compatible with you in order to minimize the chance of a transfusion reaction. If you or a relative donates blood, this is often done in anticipation of surgery and is not appropriate for emergency situations. It takes many days to process the donated blood. RISKS AND COMPLICATIONS Although transfusion therapy is very safe and saves many lives, the main dangers of transfusion include:   Getting an infectious disease.  Developing a transfusion reaction. This is an allergic reaction to something in the blood you were given. Every precaution is taken to prevent  this. The decision to have a blood transfusion has been considered carefully by your caregiver before blood is given. Blood is not given unless the benefits outweigh the risks. AFTER THE TRANSFUSION  Right after receiving a blood transfusion, you will usually feel much better and more energetic. This is especially true if your red blood cells have gotten low (anemic). The transfusion raises the level of the red blood cells which carry oxygen, and this usually causes an energy increase.  The nurse administering the transfusion will monitor you carefully for complications. HOME CARE INSTRUCTIONS  No special  instructions are needed after a transfusion. You may find your energy is better. Speak with your caregiver about any limitations on activity for underlying diseases you may have. SEEK MEDICAL CARE IF:   Your condition is not improving after your transfusion.  You develop redness or irritation at the intravenous (IV) site. SEEK IMMEDIATE MEDICAL CARE IF:  Any of the following symptoms occur over the next 12 hours:  Shaking chills.  You have a temperature by mouth above 102 F (38.9 C), not controlled by medicine.  Chest, back, or muscle pain.  People around you feel you are not acting correctly or are confused.  Shortness of breath or difficulty breathing.  Dizziness and fainting.  You get a rash or develop hives.  You have a decrease in urine output.  Your urine turns a dark color or changes to pink, red, or brown. Any of the following symptoms occur over the next 10 days:  You have a temperature by mouth above 102 F (38.9 C), not controlled by medicine.  Shortness of breath.  Weakness after normal activity.  The white part of the eye turns yellow (jaundice).  You have a decrease in the amount of urine or are urinating less often.  Your urine turns a dark color or changes to pink, red, or brown. Document Released: 09/20/2000 Document Revised: 12/16/2011 Document Reviewed: 05/09/2008 ExitCare Patient Information 2014 ExitCare, [place].  _______________________________________________________________________   CLEAR LIQUID DIET   Foods Allowed                                                                     Foods Excluded  Coffee and tea, regular and decaf                             liquids that you cannot  Plain Jell-O in any flavor                                             see through such as: Fruit ices (not with fruit pulp)                                     milk, soups, orange juice  Iced Popsicles                                    All solid  food Carbonated beverages, regular and diet                                    Cranberry, grape and apple juices Sports drinks like Gatorade Lightly seasoned clear broth or consume(fat free) Sugar, honey syrup  Sample Menu Breakfast                                Lunch                                     Supper Cranberry juice                    Beef broth                            Chicken broth Jell-O                                     Grape juice                           Apple juice Coffee or tea                        Jell-O                                      Popsicle                                                Coffee or tea                        Coffee or tea  _____________________________________________________________________

## 2015-11-22 NOTE — Progress Notes (Signed)
   11/22/15 1328  OBSTRUCTIVE SLEEP APNEA  Have you ever been diagnosed with sleep apnea through a sleep study? No  Do you snore loudly (loud enough to be heard through closed doors)?  1  Do you often feel tired, fatigued, or sleepy during the daytime (such as falling asleep during driving or talking to someone)? 0  Has anyone observed you stop breathing during your sleep? 0  Do you have, or are you being treated for high blood pressure? 1  BMI more than 35 kg/m2? 0  Age > 50 (1-yes) 1  Neck circumference greater than:Male 16 inches or larger, Male 17inches or larger? 1  Male Gender (Yes=1) 1  Obstructive Sleep Apnea Score 5  Score 5 or greater  Results sent to PCP

## 2015-11-24 ENCOUNTER — Encounter (HOSPITAL_COMMUNITY): Payer: Self-pay | Admitting: *Deleted

## 2015-11-24 ENCOUNTER — Inpatient Hospital Stay (HOSPITAL_COMMUNITY)
Admission: RE | Admit: 2015-11-24 | Discharge: 2015-11-26 | DRG: 707 | Disposition: A | Discharge status: Home / Self Care | Payer: BLUE CROSS/BLUE SHIELD | Source: Ambulatory Visit | Attending: Urology | Admitting: Urology

## 2015-11-24 ENCOUNTER — Encounter (HOSPITAL_COMMUNITY)
Admission: RE | Disposition: A | Discharge status: Home / Self Care | Payer: Self-pay | Source: Ambulatory Visit | Attending: Urology

## 2015-11-24 ENCOUNTER — Inpatient Hospital Stay (HOSPITAL_COMMUNITY): Payer: BLUE CROSS/BLUE SHIELD | Admitting: Certified Registered"

## 2015-11-24 DIAGNOSIS — I10 Essential (primary) hypertension: Secondary | ICD-10-CM | POA: Diagnosis present

## 2015-11-24 DIAGNOSIS — G35 Multiple sclerosis: Secondary | ICD-10-CM | POA: Diagnosis present

## 2015-11-24 DIAGNOSIS — C61 Malignant neoplasm of prostate: Secondary | ICD-10-CM | POA: Diagnosis present

## 2015-11-24 DIAGNOSIS — K5190 Ulcerative colitis, unspecified, without complications: Secondary | ICD-10-CM | POA: Diagnosis present

## 2015-11-24 DIAGNOSIS — I2510 Atherosclerotic heart disease of native coronary artery without angina pectoris: Secondary | ICD-10-CM | POA: Diagnosis present

## 2015-11-24 DIAGNOSIS — Z8249 Family history of ischemic heart disease and other diseases of the circulatory system: Secondary | ICD-10-CM

## 2015-11-24 DIAGNOSIS — Z833 Family history of diabetes mellitus: Secondary | ICD-10-CM | POA: Diagnosis not present

## 2015-11-24 DIAGNOSIS — E785 Hyperlipidemia, unspecified: Secondary | ICD-10-CM | POA: Diagnosis present

## 2015-11-24 DIAGNOSIS — Z01812 Encounter for preprocedural laboratory examination: Secondary | ICD-10-CM

## 2015-11-24 DIAGNOSIS — Z87891 Personal history of nicotine dependence: Secondary | ICD-10-CM

## 2015-11-24 DIAGNOSIS — K5900 Constipation, unspecified: Secondary | ICD-10-CM | POA: Diagnosis not present

## 2015-11-24 DIAGNOSIS — K760 Fatty (change of) liver, not elsewhere classified: Secondary | ICD-10-CM | POA: Diagnosis present

## 2015-11-24 DIAGNOSIS — F419 Anxiety disorder, unspecified: Secondary | ICD-10-CM | POA: Diagnosis present

## 2015-11-24 HISTORY — PX: ROBOT ASSISTED LAPAROSCOPIC RADICAL PROSTATECTOMY: SHX5141

## 2015-11-24 HISTORY — PX: LYMPHADENECTOMY: SHX5960

## 2015-11-24 LAB — TYPE AND SCREEN
ABO/RH(D): O POS
Antibody Screen: NEGATIVE

## 2015-11-24 LAB — HEMOGLOBIN AND HEMATOCRIT, BLOOD
HEMATOCRIT: 40.7 % (ref 39.0–52.0)
HEMOGLOBIN: 13.8 g/dL (ref 13.0–17.0)

## 2015-11-24 SURGERY — PROSTATECTOMY, RADICAL, ROBOT-ASSISTED, LAPAROSCOPIC
Anesthesia: General

## 2015-11-24 MED ORDER — MEPERIDINE HCL 50 MG/ML IJ SOLN: 6.2500 mg | INTRAMUSCULAR | Status: DC | PRN

## 2015-11-24 MED ORDER — ROCURONIUM BROMIDE 100 MG/10ML IV SOLN
INTRAVENOUS | Status: AC
  Filled 2015-11-24: qty 1

## 2015-11-24 MED ORDER — ONDANSETRON HCL 4 MG/2ML IJ SOLN: 4.0000 mg | INTRAMUSCULAR | Status: DC | PRN

## 2015-11-24 MED ORDER — MIDAZOLAM HCL 5 MG/5ML IJ SOLN
INTRAMUSCULAR | Status: DC | PRN
  Administered 2015-11-24: 2 mg via INTRAVENOUS

## 2015-11-24 MED ORDER — ONDANSETRON HCL 4 MG/2ML IJ SOLN
INTRAMUSCULAR | Status: AC
  Filled 2015-11-24: qty 4

## 2015-11-24 MED ORDER — ACETAMINOPHEN 500 MG PO TABS
1000.0000 mg | ORAL_TABLET | Freq: Four times a day (QID) | ORAL | Status: AC
  Administered 2015-11-24 – 2015-11-25 (×4): 1000 mg via ORAL
  Filled 2015-11-24 (×4): qty 2

## 2015-11-24 MED ORDER — STERILE WATER FOR IRRIGATION IR SOLN
Status: DC | PRN
  Administered 2015-11-24: 1000 mL

## 2015-11-24 MED ORDER — HYDROMORPHONE HCL 1 MG/ML IJ SOLN
0.2500 mg | INTRAMUSCULAR | Status: DC | PRN
  Administered 2015-11-24 (×2): 0.5 mg via INTRAVENOUS

## 2015-11-24 MED ORDER — ONDANSETRON HCL 4 MG/2ML IJ SOLN
INTRAMUSCULAR | Status: DC | PRN
  Administered 2015-11-24: 2 mg via INTRAVENOUS
  Administered 2015-11-24: 4 mg via INTRAVENOUS
  Administered 2015-11-24: 2 mg via INTRAVENOUS

## 2015-11-24 MED ORDER — PROPOFOL 10 MG/ML IV BOLUS
INTRAVENOUS | Status: AC
  Filled 2015-11-24: qty 20

## 2015-11-24 MED ORDER — SODIUM CHLORIDE 0.9 % IJ SOLN
INTRAMUSCULAR | Status: AC
  Filled 2015-11-24: qty 20

## 2015-11-24 MED ORDER — SODIUM CHLORIDE 0.9 % IV BOLUS (SEPSIS)
1000.0000 mL | Freq: Once | INTRAVENOUS | Status: AC
  Administered 2015-11-24: 1000 mL via INTRAVENOUS

## 2015-11-24 MED ORDER — HYDROMORPHONE HCL 1 MG/ML IJ SOLN
0.5000 mg | INTRAMUSCULAR | Status: DC | PRN
  Administered 2015-11-25: 0.5 mg via INTRAVENOUS
  Filled 2015-11-24 (×2): qty 1

## 2015-11-24 MED ORDER — PERMETHRIN 5 % EX CREA
1.0000 "application " | TOPICAL_CREAM | Freq: Every day | CUTANEOUS | Status: DC | PRN
  Filled 2015-11-24: qty 60

## 2015-11-24 MED ORDER — DEXAMETHASONE SODIUM PHOSPHATE 10 MG/ML IJ SOLN
INTRAMUSCULAR | Status: DC | PRN
  Administered 2015-11-24: 5 mg via INTRAVENOUS

## 2015-11-24 MED ORDER — NITROGLYCERIN 0.4 MG SL SUBL: 0.4000 mg | SUBLINGUAL_TABLET | SUBLINGUAL | Status: DC | PRN

## 2015-11-24 MED ORDER — PROPOFOL 10 MG/ML IV BOLUS
INTRAVENOUS | Status: DC | PRN
  Administered 2015-11-24: 200 mg via INTRAVENOUS

## 2015-11-24 MED ORDER — EPHEDRINE SULFATE 50 MG/ML IJ SOLN
INTRAMUSCULAR | Status: DC | PRN
  Administered 2015-11-24 (×2): 5 mg via INTRAVENOUS
  Administered 2015-11-24: 10 mg via INTRAVENOUS

## 2015-11-24 MED ORDER — SODIUM CHLORIDE 0.9 % IJ SOLN
INTRAMUSCULAR | Status: AC
  Filled 2015-11-24: qty 10

## 2015-11-24 MED ORDER — SERTRALINE HCL 25 MG PO TABS
25.0000 mg | ORAL_TABLET | Freq: Every day | ORAL | Status: DC
  Administered 2015-11-25 – 2015-11-26 (×2): 25 mg via ORAL
  Filled 2015-11-24 (×2): qty 1

## 2015-11-24 MED ORDER — LACTATED RINGERS IR SOLN
Status: DC | PRN
  Administered 2015-11-24: 1000 mL

## 2015-11-24 MED ORDER — HYDROMORPHONE HCL 2 MG/ML IJ SOLN
INTRAMUSCULAR | Status: AC
  Filled 2015-11-24: qty 1

## 2015-11-24 MED ORDER — ROCURONIUM BROMIDE 100 MG/10ML IV SOLN
INTRAVENOUS | Status: DC | PRN
  Administered 2015-11-24: 30 mg via INTRAVENOUS
  Administered 2015-11-24: 40 mg via INTRAVENOUS
  Administered 2015-11-24: 30 mg via INTRAVENOUS
  Administered 2015-11-24: 10 mg via INTRAVENOUS
  Administered 2015-11-24: 70 mg via INTRAVENOUS

## 2015-11-24 MED ORDER — SUGAMMADEX SODIUM 500 MG/5ML IV SOLN
INTRAVENOUS | Status: AC
  Filled 2015-11-24: qty 5

## 2015-11-24 MED ORDER — EPHEDRINE SULFATE 50 MG/ML IJ SOLN
INTRAMUSCULAR | Status: AC
  Filled 2015-11-24: qty 1

## 2015-11-24 MED ORDER — DEXAMETHASONE SODIUM PHOSPHATE 10 MG/ML IJ SOLN
INTRAMUSCULAR | Status: AC
  Filled 2015-11-24: qty 1

## 2015-11-24 MED ORDER — LIDOCAINE HCL (CARDIAC) 20 MG/ML IV SOLN
INTRAVENOUS | Status: DC | PRN
  Administered 2015-11-24: 80 mg via INTRAVENOUS

## 2015-11-24 MED ORDER — OXYCODONE HCL 5 MG PO TABS
5.0000 mg | ORAL_TABLET | ORAL | Status: DC | PRN
  Administered 2015-11-24 – 2015-11-26 (×6): 5 mg via ORAL
  Filled 2015-11-24 (×6): qty 1

## 2015-11-24 MED ORDER — ATORVASTATIN CALCIUM 10 MG PO TABS
20.0000 mg | ORAL_TABLET | Freq: Every day | ORAL | Status: DC
  Administered 2015-11-24 – 2015-11-25 (×2): 20 mg via ORAL
  Filled 2015-11-24 (×2): qty 2

## 2015-11-24 MED ORDER — SENNOSIDES-DOCUSATE SODIUM 8.6-50 MG PO TABS
1.0000 | ORAL_TABLET | Freq: Two times a day (BID) | ORAL | Status: DC
  Administered 2015-11-24 – 2015-11-26 (×4): 1 via ORAL
  Filled 2015-11-24 (×4): qty 1

## 2015-11-24 MED ORDER — BUPIVACAINE LIPOSOME 1.3 % IJ SUSP
20.0000 mL | Freq: Once | INTRAMUSCULAR | Status: AC
  Administered 2015-11-24: 20 mL
  Filled 2015-11-24: qty 20

## 2015-11-24 MED ORDER — DEXTROSE-NACL 5-0.45 % IV SOLN
INTRAVENOUS | Status: DC
Start: 2015-11-24 — End: 2015-11-26
  Administered 2015-11-24 – 2015-11-25 (×2): via INTRAVENOUS

## 2015-11-24 MED ORDER — FENTANYL CITRATE (PF) 100 MCG/2ML IJ SOLN
25.0000 ug | INTRAMUSCULAR | Status: DC | PRN
  Administered 2015-11-24: 50 ug via INTRAVENOUS

## 2015-11-24 MED ORDER — SULFAMETHOXAZOLE-TRIMETHOPRIM 800-160 MG PO TABS: 1.0000 | ORAL_TABLET | Freq: Two times a day (BID) | ORAL | Status: DC

## 2015-11-24 MED ORDER — HYDROCHLOROTHIAZIDE 12.5 MG PO CAPS
12.5000 mg | ORAL_CAPSULE | Freq: Every day | ORAL | Status: DC
  Administered 2015-11-24 – 2015-11-26 (×3): 12.5 mg via ORAL
  Filled 2015-11-24 (×3): qty 1

## 2015-11-24 MED ORDER — LACTATED RINGERS IV SOLN
INTRAVENOUS | Status: DC | PRN
  Administered 2015-11-24 (×2): via INTRAVENOUS

## 2015-11-24 MED ORDER — FENTANYL CITRATE (PF) 100 MCG/2ML IJ SOLN
INTRAMUSCULAR | Status: AC
  Filled 2015-11-24: qty 2

## 2015-11-24 MED ORDER — LISINOPRIL-HYDROCHLOROTHIAZIDE 10-12.5 MG PO TABS: 1.0000 | ORAL_TABLET | Freq: Every day | ORAL | Status: DC

## 2015-11-24 MED ORDER — HYDROCODONE-ACETAMINOPHEN 5-325 MG PO TABS: 1.0000 | ORAL_TABLET | Freq: Four times a day (QID) | ORAL | Status: DC | PRN

## 2015-11-24 MED ORDER — HYDROMORPHONE HCL 1 MG/ML IJ SOLN
INTRAMUSCULAR | Status: AC
  Filled 2015-11-24: qty 1

## 2015-11-24 MED ORDER — HYDROMORPHONE HCL 1 MG/ML IJ SOLN
INTRAMUSCULAR | Status: DC | PRN
  Administered 2015-11-24: 0.5 mg via INTRAVENOUS
  Administered 2015-11-24: 1 mg via INTRAVENOUS
  Administered 2015-11-24: 0.5 mg via INTRAVENOUS

## 2015-11-24 MED ORDER — DIPHENHYDRAMINE HCL 12.5 MG/5ML PO ELIX: 12.5000 mg | ORAL_SOLUTION | Freq: Four times a day (QID) | ORAL | Status: DC | PRN

## 2015-11-24 MED ORDER — CEFAZOLIN SODIUM-DEXTROSE 2-3 GM-% IV SOLR
INTRAVENOUS | Status: AC
  Filled 2015-11-24: qty 50

## 2015-11-24 MED ORDER — FENTANYL CITRATE (PF) 250 MCG/5ML IJ SOLN
INTRAMUSCULAR | Status: AC
  Filled 2015-11-24: qty 5

## 2015-11-24 MED ORDER — CEFAZOLIN SODIUM-DEXTROSE 2-3 GM-% IV SOLR
2.0000 g | INTRAVENOUS | Status: AC
  Administered 2015-11-24: 2 g via INTRAVENOUS

## 2015-11-24 MED ORDER — LIDOCAINE HCL (CARDIAC) 20 MG/ML IV SOLN
INTRAVENOUS | Status: AC
  Filled 2015-11-24: qty 5

## 2015-11-24 MED ORDER — LISINOPRIL 10 MG PO TABS
10.0000 mg | ORAL_TABLET | Freq: Every day | ORAL | Status: DC
  Administered 2015-11-24 – 2015-11-26 (×3): 10 mg via ORAL
  Filled 2015-11-24 (×3): qty 1

## 2015-11-24 MED ORDER — LACTATED RINGERS IV SOLN: INTRAVENOUS | Status: DC

## 2015-11-24 MED ORDER — SUGAMMADEX SODIUM 500 MG/5ML IV SOLN
INTRAVENOUS | Status: DC | PRN
  Administered 2015-11-24: 400 mg via INTRAVENOUS

## 2015-11-24 MED ORDER — PROMETHAZINE HCL 25 MG/ML IJ SOLN: 6.2500 mg | INTRAMUSCULAR | Status: DC | PRN

## 2015-11-24 MED ORDER — DIPHENHYDRAMINE HCL 50 MG/ML IJ SOLN
12.5000 mg | Freq: Four times a day (QID) | INTRAMUSCULAR | Status: DC | PRN
Start: 2015-11-24 — End: 2015-11-26

## 2015-11-24 MED ORDER — FENTANYL CITRATE (PF) 100 MCG/2ML IJ SOLN
INTRAMUSCULAR | Status: DC | PRN
  Administered 2015-11-24 (×2): 100 ug via INTRAVENOUS
  Administered 2015-11-24: 50 ug via INTRAVENOUS

## 2015-11-24 MED ORDER — MIDAZOLAM HCL 2 MG/2ML IJ SOLN
INTRAMUSCULAR | Status: AC
  Filled 2015-11-24: qty 2

## 2015-11-24 SURGICAL SUPPLY — 59 items
APPLICATOR COTTON TIP 6IN STRL (MISCELLANEOUS) ×3 IMPLANT
CABLE HIGH FREQUENCY MONO STRZ (ELECTRODE) IMPLANT
CATH FOLEY 2WAY SLVR 18FR 30CC (CATHETERS) ×3 IMPLANT
CATH ROBINSON RED A/P 16FR (CATHETERS) IMPLANT
CATH TIEMANN FOLEY 18FR 5CC (CATHETERS) ×3 IMPLANT
CHLORAPREP W/TINT 26ML (MISCELLANEOUS) ×3 IMPLANT
CLIP LIGATING HEM O LOK PURPLE (MISCELLANEOUS) ×6 IMPLANT
CLOTH BEACON ORANGE TIMEOUT ST (SAFETY) ×3 IMPLANT
COVER MAYO STAND STRL (DRAPES) IMPLANT
COVER SURGICAL LIGHT HANDLE (MISCELLANEOUS) ×3 IMPLANT
COVER TIP SHEARS 8 DVNC (MISCELLANEOUS) ×2 IMPLANT
COVER TIP SHEARS 8MM DA VINCI (MISCELLANEOUS) ×1
CUTTER ECHEON FLEX ENDO 45 340 (ENDOMECHANICALS) ×3 IMPLANT
DECANTER SPIKE VIAL GLASS SM (MISCELLANEOUS) ×3 IMPLANT
DRAPE ARM DVNC X/XI (DISPOSABLE) ×8 IMPLANT
DRAPE COLUMN DVNC XI (DISPOSABLE) ×2 IMPLANT
DRAPE DA VINCI XI ARM (DISPOSABLE) ×4
DRAPE DA VINCI XI COLUMN (DISPOSABLE) ×1
DRSG TEGADERM 4X4.75 (GAUZE/BANDAGES/DRESSINGS) ×6 IMPLANT
DRSG TEGADERM 6X8 (GAUZE/BANDAGES/DRESSINGS) IMPLANT
ELECT REM PT RETURN 9FT ADLT (ELECTROSURGICAL) ×3
ELECTRODE REM PT RTRN 9FT ADLT (ELECTROSURGICAL) ×2 IMPLANT
GAUZE SPONGE 2X2 8PLY STRL LF (GAUZE/BANDAGES/DRESSINGS) IMPLANT
GLOVE BIO SURGEON STRL SZ 6.5 (GLOVE) ×3 IMPLANT
GLOVE BIOGEL M STRL SZ7.5 (GLOVE) ×6 IMPLANT
GOWN STRL REUS W/TWL LRG LVL3 (GOWN DISPOSABLE) ×12 IMPLANT
GOWN STRL REUS W/TWL LRG LVL4 (GOWN DISPOSABLE) IMPLANT
HOLDER FOLEY CATH W/STRAP (MISCELLANEOUS) ×3 IMPLANT
IV LACTATED RINGERS 1000ML (IV SOLUTION) ×3 IMPLANT
KIT PROCEDURE DA VINCI SI (MISCELLANEOUS) ×1
KIT PROCEDURE DVNC SI (MISCELLANEOUS) ×2 IMPLANT
LIQUID BAND (GAUZE/BANDAGES/DRESSINGS) ×6 IMPLANT
NEEDLE INSUFFLATION 14GA 120MM (NEEDLE) ×3 IMPLANT
NEEDLE SPNL 22GX7 QUINCKE BK (NEEDLE) ×3 IMPLANT
PACK ROBOT UROLOGY CUSTOM (CUSTOM PROCEDURE TRAY) ×3 IMPLANT
PAD POSITIONING PINK XL (MISCELLANEOUS) ×3 IMPLANT
RELOAD GREEN ECHELON 45 (STAPLE) ×3 IMPLANT
SEAL CANN UNIV 5-8 DVNC XI (MISCELLANEOUS) ×8 IMPLANT
SEAL XI 5MM-8MM UNIVERSAL (MISCELLANEOUS) ×4
SET TUBE IRRIG SUCTION NO TIP (IRRIGATION / IRRIGATOR) ×3 IMPLANT
SHEET LAVH (DRAPES) IMPLANT
SOLUTION ELECTROLUBE (MISCELLANEOUS) ×3 IMPLANT
SPONGE GAUZE 2X2 STER 10/PKG (GAUZE/BANDAGES/DRESSINGS)
SPONGE LAP 4X18 X RAY DECT (DISPOSABLE) ×3 IMPLANT
SUT ETHILON 3 0 PS 1 (SUTURE) ×3 IMPLANT
SUT MNCRL AB 4-0 PS2 18 (SUTURE) ×6 IMPLANT
SUT PDS AB 1 CT1 27 (SUTURE) ×9 IMPLANT
SUT VIC AB 0 CT1 27 (SUTURE) ×2
SUT VIC AB 0 CT1 27XBRD ANTBC (SUTURE) ×4 IMPLANT
SUT VIC AB 0 CT1 36 (SUTURE) ×3 IMPLANT
SUT VIC AB 2-0 SH 27 (SUTURE) ×1
SUT VIC AB 2-0 SH 27X BRD (SUTURE) ×2 IMPLANT
SUT VICRYL 0 UR6 27IN ABS (SUTURE) ×3 IMPLANT
SUT VLOC BARB 180 ABS3/0GR12 (SUTURE) ×9
SUTURE VLOC BRB 180 ABS3/0GR12 (SUTURE) ×6 IMPLANT
SYR 27GX1/2 1ML LL SAFETY (SYRINGE) ×3 IMPLANT
TOWEL OR 17X26 10 PK STRL BLUE (TOWEL DISPOSABLE) ×3 IMPLANT
TOWEL OR NON WOVEN STRL DISP B (DISPOSABLE) ×3 IMPLANT
WATER STERILE IRR 1500ML POUR (IV SOLUTION) ×3 IMPLANT

## 2015-11-24 NOTE — H&P (Signed)
Gary [name] is an 60 y.o. male.    Chief Complaint: Pre-OP Prostatectomy  HPI:   1 - High Risk Prostate Cancer - 11/12 cores with mix of Gleason 8 and 9 disease (up to 95% core) by BX 09/2015 on eval PSA 60.9. TRUS 27m with intravesical protrusion (not median lobe). CT and Bone Scan w/o locally advanced or distant disease. Had refused BX for elevated PSA for years prior.   PMH sig for alcohol abuse, MS (no limiting deficits), CAD/Stent (follows HDaneen SchickMD cards, now on ASA 81 only, not limiting).  His PCP is JKathlene Novemberwith [employer]  Today "TOctavia Bruckner is seen to proceed with prostatectomy for primary management of his high risk prostate cancer.   Past Medical History  Diagnosis Date  . Hypertension   . [place]ative [place]   . Hyperlipidemia LDL goal < 70   . [place] ([place]    Bilateral optic neuritis, treated and resolved but felt to be a manifestation of MS.  . Hepatic steatosis   . [place]   . CAD (coronary artery disease) 10-2012    h/o stents   . Gross hematuria   . Renal cyst, left   . Elevated PSA     Prostate Bx w/ Dr. MTresa [name] . Family history of adverse reaction to anesthesia     made father disoriented  . Anxiety     Past Surgical History  Procedure Laterality Date  . Vasectomy    . Foot surgery      R toe Fx (screws),  L dorsal FX (screws)  . Left heart catheterization with coronary angiogram N/A 10/22/2012    Procedure: LEFT HEART CATHETERIZATION WITH CORONARY ANGIOGRAM;  Surgeon: HSinclair Grooms MD;  [facility];  Service: Cardiovascular;  Laterality: N/A;  . Percutaneous coronary stent intervention (pci-s) N/A 10/23/2012    Procedure: PERCUTANEOUS CORONARY STENT INTERVENTION (PCI-S);  Surgeon: HSinclair Grooms MD;  [facility];  Service: Cardiovascular;  Laterality: N/A;  . Prostate biopsy  09/2015    Dr. MTresa [name]   Family History  Problem Relation Age of Onset  . Dementia Mother   .  Hypertension Mother   . Coronary artery disease Father   . Diabetes Mellitus II Father   . Coronary artery disease Brother   . Hypertension Brother    Social History:  reports that he quit smoking about 25 years ago. His smoking use included Cigarettes. He has quit using smokeless tobacco. He reports that he drinks alcohol. He reports that he does not use illicit drugs.  Allergies: No Known Allergies  No prescriptions prior to admission    Results for orders placed or performed during the hospital encounter of 11/22/15 (from the past 48 hour(s))  CBC     Status: None   Collection Time: 11/22/15  1:30 PM  Result Value Ref Range   WBC 5.2 4.0 - 10.5 K/uL   RBC 4.96 4.22 - 5.81 MIL/uL   Hemoglobin 14.1 13.0 - 17.0 g/dL   HCT 42.9 39.0 - 52.0 %   MCV 86.5 78.0 - 100.0 fL   MCH 28.4 26.0 - 34.0 pg   MCHC 32.9 30.0 - 36.0 g/dL   RDW 13.4 11.5 - 15.5 %   Platelets 162 150 - 400 K/uL  Basic metabolic panel     Status: None   Collection Time: 11/22/15  1:30 PM  Result Value Ref Range   Sodium 139 135 -  145 mmol/L   Potassium 4.3 3.5 - 5.1 mmol/L   Chloride 104 101 - 111 mmol/L   CO2 26 22 - 32 mmol/L   Glucose, Bld 88 65 - 99 mg/dL   BUN 12 6 - 20 mg/dL   Creatinine, Ser 0.75 0.61 - 1.24 mg/dL   Calcium 9.3 8.9 - 10.3 mg/dL   GFR calc non Af Amer >60 >60 mL/min   GFR calc Af Amer >60 >60 mL/min    Comment: (NOTE) The eGFR has been calculated using the CKD EPI equation. This calculation has not been validated in all clinical situations. eGFR's persistently <60 mL/min signify possible Chronic Kidney Disease.    Anion gap 9 5 - 15  Type and screen All Cardiac and thoracic surgeries, spinal fusions, myomectomies, craniotomies, colon & liver resections, total joint revisions, same day c-section with placenta previa or accreta.     Status: None   Collection Time: 11/22/15  1:45 PM  Result Value Ref Range   ABO/RH(D) O POS    Antibody Screen NEG    Sample Expiration 11/27/2015     Extend sample reason NO TRANSFUSIONS OR PREGNANCY IN THE PAST 3 MONTHS   ABO/Rh     Status: None   Collection Time: 11/22/15  1:45 PM  Result Value Ref Range   ABO/RH(D) O POS    No results found.  Review of Systems  Constitutional: Negative.   HENT: Negative.   Eyes: Negative.   Respiratory: Negative.   Cardiovascular: Negative.   Gastrointestinal: Negative.   Genitourinary: Negative.   Musculoskeletal: Negative.   Skin: Negative.   Neurological: Negative.   Endo/Heme/Allergies: Negative.   Psychiatric/Behavioral: Negative.     There were no vitals taken for this visit. Physical Exam  Constitutional: He appears well-developed.  HENT:  Head: Normocephalic.  Eyes: Pupils are equal, round, and reactive to light.  Neck: Normal range of motion.  Cardiovascular: Normal rate.   Respiratory: Effort normal.  GI: Soft.  Genitourinary:  No CVAT  Musculoskeletal: Normal range of motion.  Neurological: He is alert.  Skin: Skin is warm.  Psychiatric: He has a normal mood and affect. His behavior is normal. Judgment and thought content normal.     Assessment/Plan    1 - High Risk Prostate Cancer - very high chance of multimodal therapy / micrometastatic disease regardless of primary treatment modality.   We rediscussed prostatectomy and specifically robotic prostatectomy with bilateral pelvic lymphadenectomy being the technique that I most commonly perform. I showed the patient on their abdomen the approximately 6 small incision (trocar) sites as well as presumed extraction sites with robotic approach as well as possible open incision sites should open conversion be necessary. We rediscussed peri-operative risks including bleeding, infection, deep vein thrombosis, pulmonary embolism, compartment syndrome, nuropathy / neuropraxia, heart attack, stroke, death, as well as long-term risks such as non-cure / need for additional therapy. We specifically readdressed that the procedure would  compromise urinary control leading to stress incontinence which typically resolves with time and pelvic rehabilitation (Kegel's, etc..), but can sometimes be permanent and require additional therapy including surgery. We also specifically readdressed sexual sequellae including significant erectile dysfunction which typically partially resolves with time but can also be permanent and require additional therapy including surgery.   We rediscussed the typical hospital course including usual 1-2 night hospitalization, discharge with foley catheter in place usually for 1-2 weeks before voiding trial as well as usually 2 week recovery until able to perform most non-strenuous activity and  6 weeks until able to return to most jobs and more strenuous activity such as exercise.   Also frankly discussed again NON-nerve sparing approach given high risk disease.  He voiced understanding and desire to proceed today as planned.    Alexis Frock, MD 11/24/2015, 7:02 AM

## 2015-11-24 NOTE — Transfer of Care (Signed)
Immediate Anesthesia Transfer of Care Note  Patient: Gary [name]  Procedure(s) Performed: Procedure(s): XI ROBOTIC ASSISTED LAPAROSCOPIC RADICAL PROSTATECTOMY WITH INDOCYANINE GREEN DYE (N/A) BILATERAL PELVIC LYMPHADENECTOMY (Bilateral)  Patient Location: PACU  Anesthesia Type:General  Level of Consciousness:  sedated, patient cooperative and responds to stimulation  Airway & Oxygen Therapy:Patient Spontanous Breathing and Patient connected to face mask oxgen  Post-op Assessment:  Report given to PACU RN and Post -op Vital signs reviewed and stable  Post vital signs:  Reviewed and stable  Last Vitals:  Filed Vitals:   11/24/15 0913  BP: 126/75  Pulse: 65  Temp: 36.4 C  Resp: 18    Complications: No apparent anesthesia complications

## 2015-11-24 NOTE — Anesthesia Preprocedure Evaluation (Addendum)
Anesthesia Evaluation  Patient identified by MRN, date of birth, ID band Patient awake    Reviewed: Allergy & Precautions, NPO status , Patient's Chart, lab work & pertinent test results  Airway Mallampati: II  TM Distance: >3 FB Neck ROM: Full    Dental no notable dental hx.    Pulmonary former smoker,    Pulmonary exam normal breath sounds clear to auscultation       Cardiovascular Exercise Tolerance: Good hypertension, Pt. on medications (-) angina+ CAD and + Cardiac Stents (2014)  Normal cardiovascular exam Rhythm:Regular Rate:Normal     Neuro/Psych Multiple Sclerosis negative psych ROS   GI/Hepatic negative GI ROS, (+)       alcohol use,   Endo/Other  negative endocrine ROS  Renal/GU negative Renal ROS  negative genitourinary   Musculoskeletal negative musculoskeletal ROS (+)   Abdominal   Peds negative pediatric ROS (+)  Hematology negative hematology ROS (+)   Anesthesia Other Findings   Reproductive/Obstetrics negative OB ROS                            Anesthesia Physical Anesthesia Plan  ASA: III  Anesthesia Plan: General   Post-op Pain Management:    Induction: Intravenous  Airway Management Planned: Oral ETT  Additional Equipment:   Intra-op Plan:   Post-operative Plan: Extubation in OR  Informed Consent: I have reviewed the patients History and Physical, chart, labs and discussed the procedure including the risks, benefits and alternatives for the proposed anesthesia with the patient or authorized representative who has indicated his/her understanding and acceptance.   Dental advisory given  Plan Discussed with: CRNA  Anesthesia Plan Comments:         Anesthesia Quick Evaluation

## 2015-11-24 NOTE — Anesthesia Procedure Notes (Signed)
Procedure Name: Intubation Date/Time: 11/24/2015 12:28 PM Performed by: Freddie Breech Pre-anesthesia Checklist: Patient identified, Emergency Drugs available, Suction available, Patient being monitored and Timeout performed Patient Re-evaluated:Patient Re-evaluated prior to inductionOxygen Delivery Method: Circle system utilized Preoxygenation: Pre-oxygenation with 100% oxygen Intubation Type: IV induction Ventilation: Oral airway inserted - appropriate to patient size and Mask ventilation without difficulty Grade View: Grade II Tube type: Oral Number of attempts: 1 Airway Equipment and Method: Patient positioned with wedge pillow and Stylet Placement Confirmation: ETT inserted through vocal cords under direct vision,  positive ETCO2,  CO2 detector and breath sounds checked- equal and bilateral Secured at: 21 cm Tube secured with: Tape Dental Injury: Teeth and Oropharynx as per pre-operative assessment

## 2015-11-24 NOTE — Brief Op Note (Signed)
11/24/2015  4:12 PM  PATIENT:  Gary [name]  60 y.o. male  PRE-OPERATIVE DIAGNOSIS:  HIGH RISK PROSTATE CANCER  POST-OPERATIVE DIAGNOSIS:  HIGH RISK PROSTATE CANCER  PROCEDURE:  Procedure(s): XI ROBOTIC ASSISTED LAPAROSCOPIC RADICAL PROSTATECTOMY WITH INDOCYANINE GREEN DYE (N/A) BILATERAL PELVIC LYMPHADENECTOMY (Bilateral)  SURGEON:  Surgeon(s) and Role:    * Alexis Frock, MD - Primary  PHYSICIAN ASSISTANT:   ASSISTANTS: Debbrah Alar, PA   ANESTHESIA:   local and general  EBL:  Total I/O In: 1000 [I.V.:1000] Out: -   BLOOD ADMINISTERED:none  DRAINS: 1 - JP to bulb, 2- Foley to gravity   LOCAL MEDICATIONS USED:  MARCAINE     SPECIMEN:  Source of Specimen:  periprostatic fat, pelvic lymph nodes, prostatectomy  DISPOSITION OF SPECIMEN:  PATHOLOGY  COUNTS:  YES  TOURNIQUET:  * No tourniquets in log *  DICTATION: .Other Dictation: Dictation Number [id number]  PLAN OF CARE: Admit to inpatient   PATIENT DISPOSITION:  PACU - hemodynamically stable.   Delay start of Pharmacological VTE agent (>24hrs) due to surgical blood loss or risk of bleeding: yes

## 2015-11-24 NOTE — Anesthesia Postprocedure Evaluation (Signed)
Anesthesia Post Note  Patient: Gary [name]  Procedure(s) Performed: Procedure(s) (LRB): XI ROBOTIC ASSISTED LAPAROSCOPIC RADICAL PROSTATECTOMY WITH INDOCYANINE GREEN DYE (N/A) BILATERAL PELVIC LYMPHADENECTOMY (Bilateral)  Patient location during evaluation: PACU Anesthesia Type: General Level of consciousness: sedated Pain management: satisfactory to patient Vital Signs Assessment: post-procedure vital signs reviewed and stable Respiratory status: spontaneous breathing Cardiovascular status: stable Anesthetic complications: no    Last Vitals:  Filed Vitals:   11/24/15 1753 11/24/15 2040  BP: 137/68 124/73  Pulse: 81 88  Temp: 36.3 C 36.5 C  Resp: 16 16    Last Pain:  Filed Vitals:   11/24/15 2040  PainSc: 0-No pain                 Illiana Losurdo EDWARD

## 2015-11-24 NOTE — Discharge Instructions (Signed)
°  1. Activity:  You are encouraged to ambulate frequently (about every hour during waking hours) to help prevent blood clots from forming in your legs or lungs.  However, you should not engage in any heavy lifting (> 10-15 lbs), strenuous activity, or straining. 2. Diet: You should continue a clear liquid diet until passing gas from below.  Once this occurs, you may advance your diet to a soft diet that would be easy to digest (i.e soups, scrambled eggs, mashed potatoes, etc.) for 24 hours just as you would if getting over a bad stomach flu.  If tolerating this diet well for 24 hours, you may then begin eating regular food.  It will be normal to have some amount of bloating, nausea, and abdominal discomfort intermittently. 3. Prescriptions:  You will be provided a prescription for pain medication to take as needed.  If your pain is not severe enough to require the prescription pain medication, you may take Tylenol instead.  You should also take an over the counter stool softener (Colace 100 mg twice daily) to avoid straining with bowel movements as the pain medication may constipate you. Finally, you will also be provided a prescription for an antibiotic to begin the day prior to your return visit in the office for catheter removal. 4. Catheter care: You will be taught how to take care of the catheter by the nursing staff prior to discharge from the hospital.  You may use both a leg bag and the larger bedside bag but it is recommended to at least use the bigger bedside bag at nighttime as the leg bag is small and will fill up overnight and also does not drain as well when lying flat. You may periodically feel a strong urge to void with the catheter in place.  This is a bladder spasm and most often can occur when having a bowel movement or when you are moving around. It is typically self-limited and usually will stop after a few minutes.  You may use some Vaseline or Neosporin around the tip of the catheter to  reduce friction at the tip of the penis. 5. Incisions: You may remove your dressing bandages the 2nd day after surgery.  You most likely will have a few small staples in each of the incisions and once the bandages are removed, the incisions may stay open to air.  You may start showering (not soaking or bathing in water) 48 hours after surgery and the incisions simply need to be patted dry after the shower.  No additional care is needed. 6. What to call us about: You should call the office [phone]) if you develop fever > 101, persistent vomiting, or the catheter stops draining. Also, feel free to call with any other questions you may have and remember the handout that was provided to you as a reference preoperatively which answers many of the common questions that arise after surgery. 7. You may resume mobic, aspirin, advil, aleve, vitamins, and supplements 7 days after surgery.

## 2015-11-25 LAB — HEMOGLOBIN AND HEMATOCRIT, BLOOD
HCT: 40.1 % (ref 39.0–52.0)
HEMOGLOBIN: 13.3 g/dL (ref 13.0–17.0)

## 2015-11-25 LAB — BASIC METABOLIC PANEL
Anion gap: 9 (ref 5–15)
BUN: 10 mg/dL (ref 6–20)
CHLORIDE: 100 mmol/L — AB (ref 101–111)
CO2: 26 mmol/L (ref 22–32)
CREATININE: 0.84 mg/dL (ref 0.61–1.24)
Calcium: 8.4 mg/dL — ABNORMAL LOW (ref 8.9–10.3)
Glucose, Bld: 128 mg/dL — ABNORMAL HIGH (ref 65–99)
POTASSIUM: 3.9 mmol/L (ref 3.5–5.1)
SODIUM: 135 mmol/L (ref 135–145)

## 2015-11-25 MED ORDER — MENTHOL 3 MG MT LOZG
1.0000 | LOZENGE | OROMUCOSAL | Status: DC | PRN
  Filled 2015-11-25: qty 9

## 2015-11-25 MED ORDER — PHENOL 1.4 % MT LIQD
1.0000 | OROMUCOSAL | Status: DC | PRN
  Filled 2015-11-25: qty 177

## 2015-11-25 NOTE — Progress Notes (Signed)
Urology Progress Note  1 Day Post-Op   Subjective:  AF, VSS. JP 30cc , bloody, last 5 hrs. Pt hungry. + clear liquids.      No acute urologic events overnight. Ambulation:   positive Flatus:    positive Bowel movement  negative  Pain: some relief  Objective:  Blood pressure 121/61, pulse 60, temperature 97.7 F (36.5 C), temperature source Oral, resp. rate 17, height 6' (1.829 m), weight 111.018 kg (244 lb 12 oz), SpO2 97 %.  Physical Exam:  General:  No acute distress, awake Extremities: extremities normal, atraumatic, no cyanosis or edema Genitourinary:  Normal foley, clear urine Foley: clear urine    I/O last 3 completed shifts: In: 3584.2 [P.O.:180; I.V.:3404.2] Out: 2660 [Urine:2300; Drains:260; Blood:100]  Recent Labs     11/22/15  1330  11/24/15  1649  11/25/15  0530  HGB  14.1  13.8  13.3  WBC  5.2   --    --   PLT  162   --    --     Recent Labs     11/22/15  1330  11/25/15  0530  NA  139  135  K  4.3  3.9  CL  104  100*  CO2  26  26  BUN  12  10  CREATININE  0.75  0.84  CALCIUM  9.3  8.4*  GFRNONAA  >60  >60  GFRAA  >60  >60     No results for input(s): INR, APTT in the last 72 hours.  Invalid input(s): PT   Invalid input(s): ABG  Assessment/Plan:  Plan for d/c in AM if JP clears, and pt able to eat. Marland Kitchen

## 2015-11-25 NOTE — Progress Notes (Signed)
Utilization review completed.  

## 2015-11-26 MED ORDER — BISACODYL 10 MG RE SUPP
10.0000 mg | Freq: Every day | RECTAL | Status: DC | PRN
  Administered 2015-11-26: 10 mg via RECTAL
  Filled 2015-11-26: qty 1

## 2015-11-26 NOTE — Progress Notes (Signed)
Urology Progress Note  2 Days Post-Op   Subjective: AF vss.  No JP op recorded for last 24 hrs.     No acute urologic events overnight. Ambulation:   positive Flatus:    positive Bowel movement  negative. Feels constipated  Pain: some relief  Objective:  Blood pressure 131/69, pulse 74, temperature 98.5 F (36.9 C), temperature source Oral, resp. rate 18, height 6' (1.829 m), weight 111.018 kg (244 lb 12 oz), SpO2 94 %.  Physical Exam:  General:  No acute distress, awake Resp: clear to auscultation bilaterally Genitourinary:  Tender. Not swollen. JP ready for removal.. Foley: clear urine.     I/O last 3 completed shifts: In: 4264.2 [P.O.:960; I.V.:3304.2] Out: 5170 [Urine:4750; Drains:222]  Recent Labs     11/24/15  1649  11/25/15  0530  HGB  13.8  13.3    Recent Labs     11/25/15  0530  NA  135  K  3.9  CL  100*  CO2  26  BUN  10  CREATININE  0.84  CALCIUM  8.4*  GFRNONAA  >60  GFRAA  >60     No results for input(s): INR, APTT in the last 72 hours.  Invalid input(s): PT   Invalid input(s): ABG  Assessment/Plan:  Catheter not removed. Ready for discharge Take citrate of Mg at home.

## 2015-11-27 ENCOUNTER — Encounter (HOSPITAL_COMMUNITY): Payer: Self-pay | Admitting: Urology

## 2015-11-27 NOTE — Op Note (Signed)
NAME:  Gary [name], Gary [name] NO.:  [id number]  MEDICAL RECORD NO.:  [id number]  LOCATION:                                 FACILITY:  PHYSICIAN:  Alexis Frock, MD     DATE OF BIRTH:  09/03/1956  DATE OF PROCEDURE: 11/24/2015                              OPERATIVE REPORT  DIAGNOSIS:  High-risk prostate cancer.  PROCEDURE: 1. Robotic-assisted laparoscopic radical prostatectomy. 2. Bilateral pelvic lymphadenectomy. 3. Injection of indocyanine green dye for sentinel lymphangiography.  ESTIMATED BLOOD LOSS:  100 mL.  COMPLICATION:  None.  SPECIMEN: 1. Prostatectomy. 2. Periprostatic fat. 3. Right external iliac lymph nodes. 4. Right obturator lymph nodes. 5. Left external iliac lymph nodes. 6. Left obturator lymph nodes.  FINDINGS:  No obvious hyperfluorescence lymph nodes in the pelvic fields.  There was a good uptake of dye with lymphatic channel seen coursing over the prostate.  INDICATIONS:  Gary [name] is a very pleasant, 60 year old gentleman, who was found on workup of a quite elevated PSA to have multifocal high risk and adenocarcinoma of the prostate.  He underwent staging evaluation with a CT and bone scan that was unremarkable for locally advanced or distant disease.  Options were discussed for management including ablative therapies versus surgical extirpation with and without minimally invasive assistance versus watchful waiting versus surveillance protocols and he adamantly wished to proceed with surgical extirpation.  We frankly discussed given his high risk status, he very well may require multimodal therapy and he understands this and wished to proceed with prostatectomy.  Informed consent was obtained and placed in medical record.  PROCEDURE IN DETAIL:  Gary [name]'s procedure being radical prostatectomy was confirmed.  Procedure was carried out.  Time-out was performed.  Intravenous antibiotics were administered.  General endotracheal  anesthesia introduced.  The patient placed into a low lithotomy position.  Sterile field was created by prepping and draping the patient's penis, perineum, proximal thighs using iodine x3.  His infraxiphoid abdomen was prepped by first clipper shaving and prepping and draping with waxing gluconate.  He was further fashioned using 3- inch tape over foam padding.  A test procedure was performed and found to be adequate position at 22 degrees of Trendelenburg.  Next, a high- flow low pressure pneumoperitoneum was obtained using Veress technique in the infraumbilical midline having passed the aspiration and drop test.  Next, an 8-mm robotic camera port was placed into this location laparoscopic examination of the peritoneal cavity revealed no significant adhesions, no visceral injury.  Additional ports were placed as follows right paramedian 8-mm robotic port, right far lateral 12-mm assistant port, right superior paramedian 5-mm suction port, left paramedian 8-mm robotic port, left far lateral 8 mm robotic port.  Robot was docked and passed through electronic checks.  Initial attention was directed at development of the space of Retzius.  Incision was made lateral to the left medial umbilical ligament from the midline towards the area of the internal ring and coursing along the iliac vessels towards the area of the ureter, the vas deferens was encountered. Purposely ligated and used as a handle for gentle medial traction on the bladder.  Next the left lateral bladder was swept away  from the pelvic sidewall towards the area of the endopelvic fascia.  A mirror-imaged dissection was performed on the right side.  Freeing the right lateral bladder away from the pelvic sidewall towards the area of endopelvic fascia and purposely transected the right vas deferens uses a bucket- handle.  Anterior attachments were taken down using cautery scissors. This exposed the anterior base of the prostate, which  was defatted to allow better visualization of the prostate and bladder neck junction. This is a periprostatic fat.  Next, the endopelvic fascia was carefully swept away from the lateral aspect of the prostate in a base to apex orientation exposed the dorsal venous complex, which was carefully controlled using vascular load stapler taking great care to avoid membranous urethral injury, which did not occur and it had been notably just before endopelvic fascia dissection, 0.2 mL of indocyanine green dye was injected into each lobe of the prostate using a percutaneously, placed robotic guided 18-gauge needle, it had been approximately 10 minutes post dye injection, by the time of the endopelvic fascia. Dissection was performed and the pelvis was inspected under near- infrared fluorescence light.  This revealed some obvious lymphatic channels coursing over the base of the prostate towards the area of the pelvic lymph node fields.  However, very careful inspection of the pelvic lymph node fields from the deep pelvic prostatic apex to the area of the aortic bifurcation revealed no obvious hyperfluorescence lymph nodes.  As such, template lymphadenectomy was performed first on the right side with the right external iliac group confines being right external iliac artery, vein, pelvic sidewall, and iliac bifurcation achieved with cold clips.  This set aside and labeled the right external iliac lymph nodes.  Similarly, the right obturator group was dissected with confines being right external iliac vein, pelvic sidewall, obturator nerve, lymphostasis again achieved with cold clips.  The obturator nerve was inspected following these maneuvers and found to be visibly intact.  A mirror image lymphadenectomy was performed on the left side, the left external iliac, and left obturator lymph node groups respectively.  The left obturator nerve was directly inspected and also found to be uninjured.  Next,  bladder neck dissection was performed by identifying the bladder neck moving the Foley catheter back and forth and initially using a lateral release technique to aid in better visualization of the bladder neck.  The bladder neck was then carefully separated away from the base of the prostate in the anterior-posterior direction keeping what appeared to be circular rim of muscle fibers with the bladder and the prostate respectively.  There was quite large intravesical protrusion of the lateral lobes especially posteriorly forming nearly a median lobe type anatomic orientation as such a 0 Vicryl stay stitch was placed in a figure-of-eight fashion at the posterior border of this and uses a bucket-handle to further define. The posterior aspect of the bladder neck and prostate junction.  This was very carefully dissected away in a similar technique, taking great care to avoid excessive bladder neck caliber.  Posterior dissection was performed by very carefully and incising approximately 1 cm inferior posterior to the posterior lip of the prostate taking great care to avoid buttonholing of the bladder given the anatomic orientation.  The plane of the non VA was encountered.  The bilateral vas deferens were encountered dissected for distance approximately 4 cm, ligated, and placed on gentle superior traction.  Bilateral seminal vesicles were dissected to the tip and also placed on gentle superior traction. Dissection proceeded  in this plane inferiorly toward the area the prostatic apex.  There was copious inflammatory response in this area, but no frank tumor nodules.  This exposed the vascular pedicles bilaterally using a very purposely wide dissection first on the right side.  The vascular pedicle was controlled using sequential clipping technique in a base to apex orientation.  Followed by the left side, again non nerve sparing approach performed.  Apical dissection was performed in the  anterior plane, but given the Foley catheter within the membranous urethra and keeping the prostate in a superior traction.  The membranous urethra was carefully transected coldly with purposely wide dissection on the prostate, this completely freed up the prostatectomy specimens placed into an EndoCatch bag for later retrieval.  Digital rectal exam was performed using indicator glove under laparoscopic vision.  No rectal violation was noted.  Posterior excision was performed using a single 3-0 V-Loc suture reapproximating the posterior urethral plate to the posterior perivesical tissue bringing the bladder neck and membranous urethra into tension-free apposition.  Mucosa-to- mucosa anastomosis was performed using running double-armed V-Loc suture from the 6 o'clock to 12 o'clock position, which resulted in excellent mucosal apposition.  A new Foley catheters in place.  A 10 mL sterile water in the balloon.  This irrigated quantitatively.  At this point, all sponge and needle counts were correct.  Hemostasis appeared excellent.  A closed suction drain was brought through the previous left lateral most robotic port site.  The area of the peritoneal cavity. Robot was then undocked.  The previous right lateral most assistant port site was closed with fascia using Carter-Thomason suture passer and 0 Vicryl.  The specimen was retrieved by extending the previous camera port site inferiorly for total distance approximately 4.5 cm, removing the prostatectomy specimen, setting aside for pathology.  The extraction site was closed with fascia using figure-of-eight PDS x5 followed by reapproximation of Scarpa's with running Vicryl.  All incision sites were infiltrated with dilute liberalize Marcaine and closed at the level of the skin using subcuticular Monocryl followed by Dermabond. Procedure was then terminated.  The patient tolerated the procedure well.  No immediate periprocedural complications.   The patient takes to the postanesthesia care unit in stable condition.          ______________________________ Alexis Frock, MD     TM/MEDQ  D:  11/24/2015  T:  11/25/2015  Job:  [id number]

## 2015-12-07 NOTE — Discharge Summary (Signed)
Physician Discharge Summary  Patient ID: Gary [name] MRN: [id number] DOB/AGE: [birth year] 58 y.o.  Admit date: 11/24/2015 Discharge date: 11/26/2015  Admission Diagnoses:Prostate Cancer  Discharge Diagnoses:  Active Problems:   Prostate cancer [facility])   Discharged Condition: good  Hospital Course:   Undwent robotic prostatectomy 11/24/2015. Final pathology metastatic cancer in pelvic lymph nodes and large extraprostatic extension. By 2/19, pain controlled, ambulating, tollerating PO intake and felt to be adequate for discharge as outlined in proress note by Dr. Gaynelle Arabian, the discharging physician.   Consults: None  Significant Diagnostic Studies: labs: as per above  Treatments: surgery: as per above  Discharge Exam: Blood pressure 131/69, pulse 74, temperature 98.5 F (36.9 C), temperature source Oral, resp. rate 18, height 6' (1.829 m), weight 111.018 kg (244 lb 12 oz), SpO2 94 %. see Dr. Arlyn Leak progress note dated day of discharge.  Disposition: 01-Home or Self Care  Discharge Instructions    Care order/instruction    Complete by:  As directed   Remove JP drain     Continue foley catheter    Complete by:  As directed      Discharge patient    Complete by:  As directed      Discontinue IV    Complete by:  As directed             Medication List    STOP taking these medications        aspirin 81 MG chewable tablet     meloxicam 7.5 MG tablet  Commonly known as:  MOBIC     multivitamins ther. w/minerals Tabs tablet      TAKE these medications        atorvastatin 20 MG tablet  Commonly known as:  LIPITOR  Take 1 tablet (20 mg total) by mouth daily.     azelastine 0.1 % nasal spray  Commonly known as:  ASTELIN  Place 2 sprays into both nostrils at bedtime as needed for rhinitis. Use in each nostril as directed     clonazePAM 0.5 MG tablet  Commonly known as:  KLONOPIN  Take 1 tablet (0.5 mg total) by mouth 2 (two) times daily as needed for  anxiety.     HYDROcodone-acetaminophen 5-325 MG tablet  Commonly known as:  NORCO  Take 1-2 tablets by mouth every 6 (six) hours as needed.     lisinopril-hydrochlorothiazide 10-12.5 MG tablet  Commonly known as:  PRINZIDE,ZESTORETIC  Take 1 tablet by mouth daily.     nitroGLYCERIN 0.4 MG SL tablet  Commonly known as:  NITROSTAT  Place 1 tablet (0.4 mg total) under the tongue every 5 (five) minutes as needed for chest pain.     permethrin 5 % cream  Commonly known as:  ELIMITE  Apply 1 application topically once.     sertraline 25 MG tablet  Commonly known as:  ZOLOFT  Take 1 tablet (25 mg total) by mouth daily.     sulfamethoxazole-trimethoprim 800-160 MG tablet  Commonly known as:  BACTRIM DS,SEPTRA DS  Take 1 tablet by mouth 2 (two) times daily. Start the day prior to foley removal appointment           Follow-up Information    Follow up with Alexis Frock, MD On 12/04/2015.   Specialty:  Urology   Why:  at 11:00   Contact information:   [place] [id number] [phone]       Follow up with Alexis Frock, MD.   Specialty:  Urology   Contact information:   [place] [address] [phone]       Signed: Alexis Frock 12/07/2015, 1:47 PM

## 2016-01-24 ENCOUNTER — Encounter: Payer: Self-pay | Admitting: Internal Medicine

## 2016-01-24 ENCOUNTER — Ambulatory Visit (INDEPENDENT_AMBULATORY_CARE_PROVIDER_SITE_OTHER): Payer: BLUE CROSS/BLUE SHIELD | Admitting: Internal Medicine

## 2016-01-24 VITALS — BP 124/76 | HR 76 | Temp 97.6°F | Ht 72.0 in | Wt 238.4 lb

## 2016-01-24 DIAGNOSIS — Z09 Encounter for follow-up examination after completed treatment for conditions other than malignant neoplasm: Secondary | ICD-10-CM

## 2016-01-24 DIAGNOSIS — I10 Essential (primary) hypertension: Secondary | ICD-10-CM | POA: Diagnosis not present

## 2016-01-24 DIAGNOSIS — Z23 Encounter for immunization: Secondary | ICD-10-CM | POA: Diagnosis not present

## 2016-01-24 DIAGNOSIS — R7303 Prediabetes: Secondary | ICD-10-CM | POA: Diagnosis not present

## 2016-01-24 DIAGNOSIS — F419 Anxiety disorder, unspecified: Secondary | ICD-10-CM

## 2016-01-24 DIAGNOSIS — C61 Malignant neoplasm of prostate: Secondary | ICD-10-CM | POA: Diagnosis not present

## 2016-01-24 LAB — BASIC METABOLIC PANEL
BUN: 10 mg/dL (ref 6–23)
CO2: 33 mEq/L — ABNORMAL HIGH (ref 19–32)
CREATININE: 0.88 mg/dL (ref 0.40–1.50)
Calcium: 10 mg/dL (ref 8.4–10.5)
Chloride: 100 mEq/L (ref 96–112)
GFR: 114.67 mL/min (ref >60.00)
Glucose, Bld: 105 mg/dL — ABNORMAL HIGH (ref 70–99)
POTASSIUM: 4.7 meq/L (ref 3.5–5.1)
Sodium: 139 mEq/L (ref 135–145)

## 2016-01-24 LAB — HEMOGLOBIN A1C: Hgb A1c MFr Bld: 5.7 % (ref 4.6–6.5)

## 2016-01-24 NOTE — Patient Instructions (Addendum)
GO TO THE LAB :      Get the blood work     [place] schedule your next appointment for a  physical exam in 6 months, fasting.  For  allergies:  Continue Astelin Also Flonase OTC and Claritin 10 mg one tablet daily    You have a history of a renal cyst, please discuss with your urologist

## 2016-01-24 NOTE — Progress Notes (Signed)
Pre visit review using our clinic review tool, if applicable. No additional management support is needed unless otherwise documented below in the visit note. 

## 2016-01-24 NOTE — Assessment & Plan Note (Signed)
Prediabetes: Check A1c HTN: Continue with Zestoretic. Check a BMP Anxiety: On Zoloft, rarely uses clonazepam, obviously concerned about his health and the recent diagnosis of cancer, counseled to the best of my ability. CAD: Asymptomatic, continue aspirin, Lipitor, Lisinopril HCT. Has nitroglycerin just in case Prostate cancer: Status post radical prostatectomy, still incontinent, hopefully that will improve in the next few weeks. Also has a history of a renal cyst, recommend to d/w  urology. RTC 07-2016 CPX

## 2016-01-24 NOTE — Progress Notes (Signed)
Subjective:    Patient ID: Gary [name], male    DOB: 12-27-1955, 60 y.o.   MRN: [id number]  DOS:  01/24/2016 Type of visit - description : Routine visit Interval history: Since the last time he was here, was diagnosed with prostate cancer, had surgery, still incontinent. Emotionally is doing okay, obviously concerned about his health  HTN: Good compliance of medication no recent ambulatory BPs High cholesterol: On statins, no apparent side effects History of EtOH: Sober   Review of Systems  denies chest pain or difficulty breathing No nausea, vomiting, diarrhea   Past Medical History  Diagnosis Date  . Hypertension   . Ulcerative colitis ([place])   . Hyperlipidemia LDL goal < 70   . Multiple sclerosis ([place]) 1991    Bilateral optic neuritis, treated and resolved but felt to be a manifestation of MS.  . Hepatic steatosis   . EtOH dependence ([place])   . CAD (coronary artery disease) 10-2012    h/o stents   . Gross hematuria   . Renal cyst, left     Bosniak 2K (indeterminant) hyperdense cyst CT 2014, Stable CT in 2016  . Elevated PSA     Prostate Bx w/ Dr. Tresa [name]  . Family history of adverse reaction to anesthesia     made father disoriented  . Anxiety   . Metastatic adenocarcinoma to prostate [facility]) 2017    Gleason score 9, metastasis to R external Iliac node and large extraprostatic extension at prostatectomy    Past Surgical History  Procedure Laterality Date  . Vasectomy    . Foot surgery      R toe Fx (screws),  L dorsal FX (screws)  . Left heart catheterization with coronary angiogram N/A 10/22/2012    Procedure: LEFT HEART CATHETERIZATION WITH CORONARY ANGIOGRAM;  Surgeon: Sinclair Grooms, MD;  Location: [facility] CATH LAB;  Service: Cardiovascular;  Laterality: N/A;  . Percutaneous coronary stent intervention (pci-s) N/A 10/23/2012    Procedure: PERCUTANEOUS CORONARY STENT INTERVENTION (PCI-S);  Surgeon: Sinclair Grooms, MD;  Location: [facility] CATH LAB;  Service:  Cardiovascular;  Laterality: N/A;  . Prostate biopsy  09/2015    Dr. Tresa [name]  . Robot assisted laparoscopic radical prostatectomy N/A 11/24/2015    Procedure: XI ROBOTIC ASSISTED LAPAROSCOPIC RADICAL PROSTATECTOMY WITH INDOCYANINE GREEN DYE;  Surgeon: Alexis Frock, MD;  Location: [facility] ORS;  Service: Urology;  Laterality: N/A;  . Lymphadenectomy Bilateral 11/24/2015    Procedure: BILATERAL PELVIC LYMPHADENECTOMY;  Surgeon: Alexis Frock, MD;  Location: [facility] ORS;  Service: Urology;  Laterality: Bilateral;    Social History   Social History  . Marital Status: Married    Spouse Name: N/A  . Number of Children: 1  . Years of Education: N/A   Occupational History  . 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 Smoking status: Former Smoker    Types: Cigarettes    Quit date: 07/18/1990  . Smokeless tobacco: Former Systems developer     Comment: 1991  . Alcohol Use: Yes     Comment: h/o abuse, currently has slow done but still drinks sometimes   . Drug Use: No  . Sexual Activity: Yes   Other Topics Concern  . Not on file   Social History Narrative   Household-- pt and wife   Has an adult son        Medication List       This list is accurate as of: 01/24/16  5:54 PM.  Always use your most recent med list.               aspirin 81 MG tablet  Take 81 mg by mouth daily.     atorvastatin 20 MG tablet  Commonly known as:  LIPITOR  Take 1 tablet (20 mg total) by mouth daily.     azelastine 0.1 % nasal spray  Commonly known as:  ASTELIN  Place 2 sprays into both nostrils at bedtime as needed for rhinitis. Use in each nostril as directed     clonazePAM 0.5 MG tablet  Commonly known as:  KLONOPIN  Take 1 tablet (0.5 mg total) by mouth 2 (two) times daily as needed for anxiety.     lisinopril-hydrochlorothiazide 10-12.5 MG tablet  Commonly known as:  PRINZIDE,ZESTORETIC  Take 1 tablet by mouth daily.     nitroGLYCERIN 0.4 MG SL tablet  Commonly known as:  NITROSTAT  Place 1  tablet (0.4 mg total) under the tongue every 5 (five) minutes as needed for chest pain.     permethrin 5 % cream  Commonly known as:  ELIMITE  Apply 1 application topically once.     sertraline 25 MG tablet  Commonly known as:  ZOLOFT  Take 1 tablet (25 mg total) by mouth daily.           Objective:   Physical Exam BP 124/76 mmHg  Pulse 76  Temp(Src) 97.6 F (36.4 C) (Oral)  Ht 6' (1.829 m)  Wt 238 lb 6.4 oz (108.138 kg)  BMI 32.33 kg/m2  SpO2 98% General:   Well developed, well nourished . NAD.  HEENT:  Normocephalic . Face symmetric, atraumatic Lungs:  CTA B Normal respiratory effort, no intercostal retractions, no accessory muscle use. Heart: RRR,  no murmur.  No pretibial edema bilaterally  Skin: Not pale. Not jaundice Neurologic:  alert & oriented X3.  Speech normal, gait appropriate for age and unassisted Psych--  Cognition and judgment appear intact.  Cooperative with normal attention span and concentration.  Behavior appropriate. No anxious or depressed appearing.      Assessment & Plan:   Assessment > Pre-diabetes HTN Hyperlipidemia Anxiety-- rarely rakes clonazepam CAD s/p stents 2014  EtOH dependency Ulcerative colitis Metastatic prostate cancer, DX 11-2015, radical prostatectomy 11-2015 -- incontinent as off 01-2016 Hematuria, saw urology 01-2013 reviewed: CT show a renal cyst, cystoscopy negative, they recommended follow-up MRI for the renal cyst. MS  Dx 1991 (B neuritis, rx, resolved, felt to be d/t MS)   PLAN Prediabetes: Check A1c HTN: Continue with Zestoretic. Check a BMP Anxiety: On Zoloft, rarely uses clonazepam, obviously concerned about his health and the recent diagnosis of cancer, counseled to the best of my ability. CAD: Asymptomatic, continue aspirin, Lipitor, Lisinopril HCT. Has nitroglycerin just in case Prostate cancer: Status post radical prostatectomy, still incontinent, hopefully that will improve in the next few weeks. Also  has a history of a renal cyst, recommend to d/w  urology. RTC 07-2016 CPX

## 2016-01-25 DIAGNOSIS — C61 Malignant neoplasm of prostate: Secondary | ICD-10-CM | POA: Diagnosis not present

## 2016-02-01 DIAGNOSIS — M6281 Muscle weakness (generalized): Secondary | ICD-10-CM | POA: Diagnosis not present

## 2016-02-01 DIAGNOSIS — Z0000 Encounter for general adult medical examination without abnormal findings: Secondary | ICD-10-CM | POA: Diagnosis not present

## 2016-02-01 DIAGNOSIS — C61 Malignant neoplasm of prostate: Secondary | ICD-10-CM | POA: Diagnosis not present

## 2016-02-29 DIAGNOSIS — M6281 Muscle weakness (generalized): Secondary | ICD-10-CM | POA: Diagnosis not present

## 2016-02-29 DIAGNOSIS — C61 Malignant neoplasm of prostate: Secondary | ICD-10-CM | POA: Diagnosis not present

## 2016-03-07 LAB — PSA: PSA: 3.88

## 2016-03-12 ENCOUNTER — Other Ambulatory Visit: Payer: Self-pay | Admitting: Internal Medicine

## 2016-03-19 DIAGNOSIS — N5201 Erectile dysfunction due to arterial insufficiency: Secondary | ICD-10-CM | POA: Diagnosis not present

## 2016-03-19 DIAGNOSIS — C61 Malignant neoplasm of prostate: Secondary | ICD-10-CM | POA: Diagnosis not present

## 2016-03-26 DIAGNOSIS — N5201 Erectile dysfunction due to arterial insufficiency: Secondary | ICD-10-CM | POA: Diagnosis not present

## 2016-03-26 DIAGNOSIS — C61 Malignant neoplasm of prostate: Secondary | ICD-10-CM | POA: Diagnosis not present

## 2016-03-26 DIAGNOSIS — N393 Stress incontinence (female) (male): Secondary | ICD-10-CM | POA: Diagnosis not present

## 2016-03-28 ENCOUNTER — Encounter: Payer: Self-pay | Admitting: Internal Medicine

## 2016-03-29 ENCOUNTER — Encounter: Payer: Self-pay | Admitting: Oncology

## 2016-04-16 ENCOUNTER — Telehealth: Payer: Self-pay | Admitting: Oncology

## 2016-04-16 ENCOUNTER — Encounter: Payer: Self-pay | Admitting: Medical Oncology

## 2016-04-16 ENCOUNTER — Encounter: Payer: Self-pay | Admitting: Pharmacist

## 2016-04-16 ENCOUNTER — Ambulatory Visit (HOSPITAL_BASED_OUTPATIENT_CLINIC_OR_DEPARTMENT_OTHER): Payer: BLUE CROSS/BLUE SHIELD | Admitting: Oncology

## 2016-04-16 VITALS — BP 138/79 | HR 52 | Temp 98.0°F | Resp 18 | Ht 72.0 in | Wt 242.4 lb

## 2016-04-16 DIAGNOSIS — C61 Malignant neoplasm of prostate: Secondary | ICD-10-CM | POA: Diagnosis not present

## 2016-04-16 DIAGNOSIS — Z87891 Personal history of nicotine dependence: Secondary | ICD-10-CM

## 2016-04-16 DIAGNOSIS — I10 Essential (primary) hypertension: Secondary | ICD-10-CM

## 2016-04-16 DIAGNOSIS — Z5111 Encounter for antineoplastic chemotherapy: Secondary | ICD-10-CM | POA: Diagnosis not present

## 2016-04-16 DIAGNOSIS — E291 Testicular hypofunction: Secondary | ICD-10-CM | POA: Diagnosis not present

## 2016-04-16 DIAGNOSIS — C779 Secondary and unspecified malignant neoplasm of lymph node, unspecified: Secondary | ICD-10-CM

## 2016-04-16 MED ORDER — ABIRATERONE ACETATE 250 MG PO TABS: 1000.0000 mg | ORAL_TABLET | Freq: Every day | ORAL | Status: DC

## 2016-04-16 MED ORDER — PREDNISONE 5 MG PO TABS: 5.0000 mg | ORAL_TABLET | Freq: Every day | ORAL | Status: DC

## 2016-04-16 NOTE — Progress Notes (Signed)
Please see consult note.  

## 2016-04-16 NOTE — Consult Note (Signed)
Reason for Referral: Prostate cancer.   HPI: 60 year old gentleman native of [place] and currently of [place]. He is a gentleman with history of hypertension and ulcerative colitis as well as coronary artery disease. He was then noted to have an elevated PSA in October 2016 with was noted his PSA was 60.98. He was evaluated by Dr. Tresa [name] and a biopsy of his prostate showed prostate adenocarcinoma Gleason score of 9 with another pattern of Gleason score 8. High-volume disease was noted. His staging workup did not reveal any evidence of metastatic disease in December 2016 including a bone scan. He underwent robotic-assisted laparoscopic radical prostatectomy and bilateral pelvic lymphadenectomy on 11/24/2015. The final pathology revealed a prostate adenocarcinoma Gleason score 4+5 = 9 with extraprostatic extension is identified at the right anterior apex and mid, bilateral posterior from the apex and bladder neck area. Bilateral seminal vesicle infiltration was identified. Lymphovascular invasion is identified. Margins of resections were positive. One positive lymph node was identified out of a 9 examined. The final pathological staging was T3b N1. His follow-up PSA remained elevated with a PSA on 03/07/2016 was 3.88. He was referred to me for evaluation regarding subsequent therapies. He received androgen deprivation therapy under the care of Dr. Tresa [name] starting on 04/16/2016. He denied any specific symptoms at this time. He denied any hematuria or dysuria. He denied any skeletal complaints. He continues to perform activities of daily living without any decline.  He does not report any headaches, blurry vision, syncope or seizures. He does not report any fevers or chills or sweats. He does not report any cough, wheezing or hemoptysis. Does not report any nausea, vomiting or abdominal pain. Does not report any other satiety or change in his bowel habits. He does not report any frequency, urgency or hematuria.  He does not report any dysuria. He does not report any skeletal complaints. Remaining review of systems unremarkable.   Past Medical History  Diagnosis Date  . Hypertension   . Ulcerative colitis ([place])   . Hyperlipidemia LDL goal < 70   . Multiple sclerosis ([place]) 1991    Bilateral optic neuritis, treated and resolved but felt to be a manifestation of MS.  . Hepatic steatosis   . EtOH dependence ([place])   . CAD (coronary artery disease) 10-2012    h/o stents   . Gross hematuria   . Renal cyst, left     Bosniak 2K (indeterminant) hyperdense cyst CT 2014, Stable CT in 2016  . Elevated PSA     Prostate Bx w/ Dr. Tresa [name]  . Family history of adverse reaction to anesthesia     made father disoriented  . Anxiety   . Metastatic adenocarcinoma to prostate [facility]) 2017    Gleason score 9, metastasis to R external Iliac node and large extraprostatic extension at prostatectomy  :  Past Surgical History  Procedure Laterality Date  . Vasectomy    . Foot surgery      R toe Fx (screws),  L dorsal FX (screws)  . Left heart catheterization with coronary angiogram N/A 10/22/2012    Procedure: LEFT HEART CATHETERIZATION WITH CORONARY ANGIOGRAM;  Surgeon: Sinclair Grooms, MD;  Location: [facility] CATH LAB;  Service: Cardiovascular;  Laterality: N/A;  . Percutaneous coronary stent intervention (pci-s) N/A 10/23/2012    Procedure: PERCUTANEOUS CORONARY STENT INTERVENTION (PCI-S);  Surgeon: Sinclair Grooms, MD;  Location: [facility] CATH LAB;  Service: Cardiovascular;  Laterality: N/A;  . Prostate biopsy  09/2015    Dr. Tresa [name]  .  Robot assisted laparoscopic radical prostatectomy N/A 11/24/2015    Procedure: XI ROBOTIC ASSISTED LAPAROSCOPIC RADICAL PROSTATECTOMY WITH INDOCYANINE GREEN DYE;  Surgeon: Alexis Frock, MD;  Location: [facility] ORS;  Service: Urology;  Laterality: N/A;  . Lymphadenectomy Bilateral 11/24/2015    Procedure: BILATERAL PELVIC LYMPHADENECTOMY;  Surgeon: Alexis Frock, MD;  Location: [facility] ORS;  Service:  Urology;  Laterality: Bilateral;  :   Current outpatient prescriptions:  .  aspirin 81 MG tablet, Take 81 mg by mouth daily., Disp: , Rfl:  .  atorvastatin (LIPITOR) 20 MG tablet, Take 1 tablet (20 mg total) by mouth daily., Disp: 90 tablet, Rfl: 1 .  azelastine (ASTELIN) 0.1 % nasal spray, Place 2 sprays into both nostrils at bedtime as needed for rhinitis. Use in each nostril as directed, Disp: 30 mL, Rfl: 3 .  lisinopril-hydrochlorothiazide (PRINZIDE,ZESTORETIC) 10-12.5 MG tablet, Take 1 tablet by mouth daily., Disp: 90 tablet, Rfl: 1 .  sertraline (ZOLOFT) 25 MG tablet, Take 1 tablet (25 mg total) by mouth daily., Disp: 90 tablet, Rfl: 1 .  abiraterone Acetate (ZYTIGA) 250 MG tablet, Take 4 tablets (1,000 mg total) by mouth daily. Take on an empty stomach 1 hour before or 2 hours after a meal, Disp: 120 tablet, Rfl: 0 .  nitroGLYCERIN (NITROSTAT) 0.4 MG SL tablet, Place 1 tablet (0.4 mg total) under the tongue every 5 (five) minutes as needed for chest pain. (Patient not taking: Reported on 04/16/2016), Disp: 25 tablet, Rfl: 3 .  permethrin (ELIMITE) 5 % cream, Apply 1 application topically once. (Patient not taking: Reported on 04/16/2016), Disp: 60 g, Rfl: 1 .  predniSONE (DELTASONE) 5 MG tablet, Take 1 tablet (5 mg total) by mouth daily with breakfast., Disp: 30 tablet, Rfl: 3:  No Known Allergies:  Family History  Problem Relation Age of Onset  . Dementia Mother   . Hypertension Mother   . Coronary artery disease Father   . Diabetes Mellitus II Father   . Coronary artery disease Brother   . Hypertension Brother   :  Social History   Social History  . Marital Status: Married    Spouse Name: N/A  . Number of Children: 1  . Years of Education: N/A   Occupational History  . 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 Smoking status: Former Smoker    Types: Cigarettes    Quit date: 07/18/1990  . Smokeless tobacco: Former Systems developer     Comment: 1991  . Alcohol Use: Yes      Comment: h/o abuse, currently has slow done but still drinks sometimes   . Drug Use: No  . Sexual Activity: Yes   Other Topics Concern  . Not on file   Social History Narrative   Household-- pt and wife   Has an adult son  :  Pertinent items are noted in HPI.  Exam: Blood pressure 138/79, pulse 52, temperature 98 F (36.7 C), temperature source Oral, resp. rate 18, height 6' (1.829 m), weight 242 lb 6.4 oz (109.952 kg), SpO2 100 %. General appearance: alert and cooperative Head: Normocephalic, without obvious abnormality Nose: Nares normal. Septum midline. Mucosa normal. No drainage or sinus tenderness. Throat: lips, mucosa, and tongue normal; teeth and gums normal Neck: no adenopathy Back: negative Resp: clear to auscultation bilaterally Chest wall: no tenderness Cardio: regular rate and rhythm, S1, S2 normal, no murmur, click, rub or gallop GI: soft, non-tender; bowel sounds normal; no masses,  no organomegaly Extremities: extremities normal, atraumatic, no cyanosis  or edema Pulses: 2+ and symmetric Skin: Skin color, texture, turgor normal. No rashes or lesions Lymph nodes: Cervical, supraclavicular, and axillary nodes normal. Neurologic: Grossly normal  CBC    Component Value Date/Time   WBC 5.2 11/22/2015 1330   RBC 4.96 11/22/2015 1330   HGB 13.3 11/25/2015 0530   HCT 40.1 11/25/2015 0530   PLT 162 11/22/2015 1330   MCV 86.5 11/22/2015 1330   MCH 28.4 11/22/2015 1330   MCHC 32.9 11/22/2015 1330   RDW 13.4 11/22/2015 1330   LYMPHSABS 2.1 07/12/2015 0831   MONOABS 0.4 07/12/2015 0831   EOSABS 0.1 07/12/2015 0831   BASOSABS 0.0 07/12/2015 0831      Chemistry      Component Value Date/Time   NA 139 01/24/2016 0919   K 4.7 01/24/2016 0919   CL 100 01/24/2016 0919   CO2 33* 01/24/2016 0919   BUN 10 01/24/2016 0919   CREATININE 0.88 01/24/2016 0919      Component Value Date/Time   CALCIUM 10.0 01/24/2016 0919   ALKPHOS 77 07/12/2015 0831   AST 33  07/12/2015 0831   ALT 61* 07/12/2015 0831   BILITOT 0.9 07/12/2015 0831       Assessment and Plan:   60 year old gentleman with the following issues:  1. The prostate cancer diagnosed in October 2016. His PSA was 60.98 and a Gleason score of 4+5 = 9. He is status post radical prostatectomy on 11/24/2015 for locally advanced disease with his disease showed positive margins and extraprostatic extension. He had positive lymph node as well as persistent elevation in his PSA. His follow-up PSA in June 2017 showed elevated PSA of 3.88.  The natural course of this disease was discussed as well as potential additional therapy. I agree with starting androgen deprivation therapy given his persistent elevation in his PSA and high risk features. The logistics of administration of additional therapy were reviewed today. The rationale for using adjuvant Taxotere chemotherapy was reviewed but given the fact that he does not have bulky disease the role for systemic chemotherapy is less clear at this time.  The role of adding Zytiga in addition to androgen deprivation therapy was also reviewed today. 2 studies have supported the use of Zytiga in this particular setting especially in the M0 setting. This was supported by the STAMPEDE which started in the role of Zytiga in addition to androgen deprivation therapy in the advanced prostate cancer setting even without measurable disease. There is a substantial improvement in overall survival as well as prostate specific relapse rate.  The rationale for using Zytiga was reviewed today as well as complications. These would include hypertension, hypokalemia, liver function abnormalities and prednisone related complications. The benefit would be as mentioned substantial improvement in his disease free and overall survival.  After discussion today he is willing to proceed with Zytiga at this time. He will follow-up in the next 3-4 weeks to discuss any complications related  to this medication. We will start him on a 1000 mg daily with prednisone at 5 mg daily.  2. Local therapy: Adjuvant radiation therapy could be utilized if he develops local pain for recurrence. I do not think given his systemic disease but local therapy will and substantial benefit on his overall disease control.  3. Androgen depravation: I have recommended continuing this indefinitely.  4. Bone health: He would benefit from routine bone marrow density testing given his long-term androgen deprivation.  5. Treatment goal: Likely palliative and less likely curative at this time.  He remains in excellent health and shape and aggressive therapy is warranted.  6. Follow-up: Will be in 4 weeks to assess his response to this therapy.

## 2016-04-16 NOTE — Telephone Encounter (Signed)
per pof to sch pt appt-gave pt copy of avs °

## 2016-04-16 NOTE — Progress Notes (Signed)
Oral Chemotherapy Pharmacist Encounter  New prescription for Gary [name] has been sent to the [facility] outpatient pharmacy for benefit analysis and approval.   Will follow up with patient regarding insurance and pharmacy.   Once pt begins Zytiga, we will follow up in 1-2 weeks for adherence and toxicity management.   Thank you, Kennith Center, Pharm.D., CPP 04/16/2016@2 :23 PM Oral Chemotherapy Clinic

## 2016-04-17 ENCOUNTER — Encounter: Payer: Self-pay | Admitting: Pharmacist

## 2016-04-17 NOTE — Progress Notes (Signed)
PA submitted online w/ covermymeds.  Will f/u status by end of the week.  Kennith Center, Pharm.D., CPP 04/17/2016@1 :00 PM

## 2016-04-19 ENCOUNTER — Telehealth: Payer: Self-pay | Admitting: Pharmacist

## 2016-04-19 NOTE — Telephone Encounter (Addendum)
Received communication by fax that the Prior Authorization for Gary [name] was denied. I called [facility] at [phone] to follow up. The denial was due to missing information/outdated form. I completed the second request prior authorization - [phone]. The caller indicated it would most likely be denied due to not meeting the diagnosis criteria. It may be necessary for MD to do peer to peer review.  04/19/16 @ 4:13pm - Second request prior authorization was denied.  Information given to Dr. Alen Blew for peer to peer review. Phone:  [phone]  04/22/16 @ 9am - After discussion with MD, will complete written appeal request form paperwork. Then will do peer to peer, if needed. I printed the STAMPEDE trial and other supporting documentation for Zytiga use in this setting as well as last MD office note discussing Zytiga use in this setting.  UpToDate information on this use (off-label) printed as well. Dr. Alen Blew reviewed and signed forms.  Raul Del, PharmD, BCPS, [facility] [phone]

## 2016-04-22 ENCOUNTER — Telehealth: Payer: Self-pay | Admitting: Pharmacist

## 2016-04-22 NOTE — Telephone Encounter (Signed)
Faxed PA appeal request form for Zytiga to [facility] PA Department @ [phone]. All forms reviewed and signed by MD. Faxed confirmation received. Number of pages = 42 including coversheet, appeal request form, copies of denial letters from [place], physician letter in support of the appeal, pertinent medical records,  STAMPEDE abstract and STAMPEDE full article, UpToDate off-label dosing and other supporting documentation.   Raul Del, PharmD, BCPS, [facility] [phone]

## 2016-04-24 ENCOUNTER — Encounter: Payer: Self-pay | Admitting: Pharmacist

## 2016-04-24 NOTE — Progress Notes (Signed)
Received notice of approval for Zytiga appeal from [facility] PA dept. Pts insur requires he use [name].  I have faxed the Rx for Zytiga to [name] (fax #s: [phone] and [phone]; ph# [phone]).  I spoke with patient over phone for overview of Zytiga. Counseled patient on administration, dosing, side effects, safe handling, and monitoring. Side effects include but not limited to: HTN, edema, myalgia, fatigue.  He voiced understanding and appreciation.   Pt has a home BP monitor and plans to check his BP regularly once he starts Zytiga.  All current questions answered.  I provided our contact number for oral chemo clinic.  Pt also given [name] phone number.  He plans to call them tomorrow if they don't contact him first.  Will f/u w/ [name] in the next 24-48 hrs to check status of Rx fill.   Once pt starts Zytiga, we will follow up in 1-2 weeks for adherence and toxicity management. Pt aware.  Thank you, Kennith Center, Pharm.D., CPP 04/24/2016@11 :01 AM Oral Chemotherapy Clinic

## 2016-04-26 ENCOUNTER — Telehealth: Payer: Self-pay | Admitting: Pharmacist

## 2016-04-26 NOTE — Telephone Encounter (Addendum)
Pt called to let us know that he spoke with [name]. Gary [name] will be shipped overnight on Monday, 7/24 to arrive on Tuesday, 7/25. $0 copay. Patient thanked [place] for all our help in obtaining this medication. He will let us know if his medication doesn't arrive on 7/25. He has our number if he has any questions.  Will f/u with patient on ~ 05/07/16 for side effect and toxicity management. Next MD appt is 05/17/16.  Raul Del, PharmD, BCPS, [facility] [phone]

## 2016-04-26 NOTE — Telephone Encounter (Signed)
Called [name] to f/u on status of Zytiga Rx. $0 copay, drug is ready to ship. They reached out to patient on 7/20 to confirm address for drug delivery and to receive pt approval for ok to ship. Patient has not returned the call. I verified correct mobile number with [name]. Pt must call [name] to confirm delivery address and confirm it is ok to ship drug. $0 copay   I left an additional message for pt on 7/21.     Patient needs to call [name] -->Phone: [phone]     Will f/u on 7/25.  Raul Del, PharmD, BCPS, [facility] [phone]

## 2016-05-02 ENCOUNTER — Telehealth: Payer: Self-pay | Admitting: Pharmacist

## 2016-05-02 NOTE — Telephone Encounter (Signed)
Oral Chemotherapy Pharmacist Encounter  Pt called Oral Chemo Clinic this morning to report he had received his Zytiga in the mail on 7/26 and started his 1st dose this morning (05/02/16). He took [place] on an empty stomach; one hour later took his prednisone with an apple. Offered to answer any questions and retrieve appointment dates. Pt declined.  Oral Chemo Clinic will follow-up in ~7-10 for toxicity and compliance management.  Johny Drilling, PharmD, BCPS Oral Chemotherapy Clinic

## 2016-05-08 ENCOUNTER — Other Ambulatory Visit: Payer: Self-pay | Admitting: *Deleted

## 2016-05-08 MED ORDER — ABIRATERONE ACETATE 250 MG PO TABS: 1000.0000 mg | ORAL_TABLET | Freq: Every day | ORAL | 0 refills | Status: DC

## 2016-05-17 ENCOUNTER — Telehealth: Payer: Self-pay | Admitting: Oncology

## 2016-05-17 ENCOUNTER — Other Ambulatory Visit (HOSPITAL_BASED_OUTPATIENT_CLINIC_OR_DEPARTMENT_OTHER): Payer: BLUE CROSS/BLUE SHIELD

## 2016-05-17 ENCOUNTER — Ambulatory Visit (HOSPITAL_BASED_OUTPATIENT_CLINIC_OR_DEPARTMENT_OTHER): Payer: BLUE CROSS/BLUE SHIELD | Admitting: Oncology

## 2016-05-17 VITALS — BP 156/83 | HR 54 | Temp 97.6°F | Resp 18 | Ht 72.0 in | Wt 243.8 lb

## 2016-05-17 DIAGNOSIS — C61 Malignant neoplasm of prostate: Secondary | ICD-10-CM

## 2016-05-17 LAB — CBC WITH DIFFERENTIAL/PLATELET
BASO%: 1 % (ref 0.0–2.0)
BASOS ABS: 0.1 10*3/uL (ref 0.0–0.1)
EOS ABS: 0.1 10*3/uL (ref 0.0–0.5)
EOS%: 1.3 % (ref 0.0–7.0)
HEMATOCRIT: 42.6 % (ref 38.4–49.9)
HGB: 14.2 g/dL (ref 13.0–17.1)
LYMPH#: 1.8 10*3/uL (ref 0.9–3.3)
LYMPH%: 29.9 % (ref 14.0–49.0)
MCH: 28.5 pg (ref 27.2–33.4)
MCHC: 33.4 g/dL (ref 32.0–36.0)
MCV: 85.2 fL (ref 79.3–98.0)
MONO#: 0.3 10*3/uL (ref 0.1–0.9)
MONO%: 5.8 % (ref 0.0–14.0)
NEUT#: 3.7 10*3/uL (ref 1.5–6.5)
NEUT%: 62 % (ref 39.0–75.0)
Platelets: 171 10*3/uL (ref 140–400)
RBC: 5 10*6/uL (ref 4.20–5.82)
RDW: 14.3 % (ref 11.0–14.6)
WBC: 5.9 10*3/uL (ref 4.0–10.3)

## 2016-05-17 LAB — COMPREHENSIVE METABOLIC PANEL
ALK PHOS: 82 U/L (ref 40–150)
ALT: 45 U/L (ref 0–55)
AST: 25 U/L (ref 5–34)
Albumin: 4.2 g/dL (ref 3.5–5.0)
Anion Gap: 10 mEq/L (ref 3–11)
BUN: 11.5 mg/dL (ref 7.0–26.0)
CALCIUM: 9.7 mg/dL (ref 8.4–10.4)
CHLORIDE: 104 meq/L (ref 98–109)
CO2: 25 mEq/L (ref 22–29)
Creatinine: 0.8 mg/dL (ref 0.7–1.3)
Glucose: 97 mg/dl (ref 70–140)
POTASSIUM: 3.7 meq/L (ref 3.5–5.1)
SODIUM: 139 meq/L (ref 136–145)
Total Bilirubin: 0.92 mg/dL (ref 0.20–1.20)
Total Protein: 8.1 g/dL (ref 6.4–8.3)

## 2016-05-17 NOTE — Telephone Encounter (Signed)
Gave patient avs report and appointments for September.  °

## 2016-05-17 NOTE — Progress Notes (Signed)
Hematology and Oncology Follow Up Visit  Gary [name] [id number] [birth year] 60 y.o. 05/17/2016 3:17 PM [name], MDPaz, [name], MD   Principle Diagnosis:  60 year old gentleman diagnosed with prostate cancer in October 2016. His PSA was 60.98 and a Gleason score 4+5 = 9. He has biochemically recurrent advanced disease.   Prior Therapy:   He underwent robotic-assisted laparoscopic radical prostatectomy and bilateral pelvic lymphadenectomy on 11/24/2015. The final pathology revealed a prostate adenocarcinoma Gleason score 4+5 = 9 with extraprostatic extension is identified at the right anterior apex and mid, bilateral posterior from the apex and bladder neck area. Bilateral seminal vesicle infiltration was identified. Lymphovascular invasion is identified. Margins of resections were positive. One positive lymph node was identified out of a 9 examined. The final pathological staging was T3b N1.  His follow-up PSA remained elevated with a PSA on 03/07/2016 was 3.88.  Current therapy:  Androgen deprivation under the care of Dr. Tresa [name]. Zytiga 1000 mg daily with prednisone 5 mg daily started in July 2017.  Interim History: [name] presents today for a follow-up visit. Since the last visit, he started Zytiga with prednisone and took it for about 2 weeks. He tolerated this medication well except for treatment fatigue and increase in his blood pressure. He been monitoring his blood pressure periodically and his systolic blood pressure was 150 to  160. He denied any lower extremity edema or other complications. He continues to be active and perform activities of daily living. He denied any skeletal complaints of arthralgias or myalgias.   He does not report any headaches, blurry vision, syncope or seizures. He does not report any fevers or chills or sweats. He does not report any cough, wheezing or hemoptysis. Does not report any nausea, vomiting or abdominal pain. Does not report any other satiety  or change in his bowel habits. He does not report any frequency, urgency or hematuria. He does not report any dysuria. He does not report any skeletal complaints. Remaining review of systems unremarkable.   Medications: I have reviewed the patient's current medications.  Current Outpatient Prescriptions  Medication Sig Dispense Refill  . abiraterone Acetate (ZYTIGA) 250 MG tablet Take 4 tablets (1,000 mg total) by mouth daily. Take on an empty stomach 1 hour before or 2 hours after a meal 120 tablet 0  . aspirin 81 MG tablet Take 81 mg by mouth daily.    Marland Kitchen atorvastatin (LIPITOR) 20 MG tablet Take 1 tablet (20 mg total) by mouth daily. 90 tablet 1  . azelastine (ASTELIN) 0.1 % nasal spray Place 2 sprays into both nostrils at bedtime as needed for rhinitis. Use in each nostril as directed 30 mL 3  . lisinopril-hydrochlorothiazide (PRINZIDE,ZESTORETIC) 10-12.5 MG tablet Take 1 tablet by mouth daily. 90 tablet 1  . permethrin (ELIMITE) 5 % cream Apply 1 application topically once. 60 g 1  . predniSONE (DELTASONE) 5 MG tablet Take 1 tablet (5 mg total) by mouth daily with breakfast. 30 tablet 3  . sertraline (ZOLOFT) 25 MG tablet Take 1 tablet (25 mg total) by mouth daily. 90 tablet 1  . nitroGLYCERIN (NITROSTAT) 0.4 MG SL tablet Place 1 tablet (0.4 mg total) under the tongue every 5 (five) minutes as needed for chest pain. (Patient not taking: Reported on 04/16/2016) 25 tablet 3   No current facility-administered medications for this visit.      Allergies: No Known Allergies  Past Medical History, Surgical history, Social history, and Family History were reviewed and updated.  Physical Exam: Blood pressure (!) 156/83, pulse (!) 54, temperature 97.6 F (36.4 C), temperature source Oral, resp. rate 18, height 6' (1.829 m), weight 243 lb 12.8 oz (110.6 kg), SpO2 99 %. ECOG: 0 General appearance: alert and cooperative appeared without distress. Head: Normocephalic, without obvious abnormality  no oral ulcers or lesions. Neck: no adenopathy Lymph nodes: Cervical, supraclavicular, and axillary nodes normal. Heart:regular rate and rhythm, S1, S2 normal, no murmur, click, rub or gallop Lung:chest clear, no wheezing, rales, normal symmetric air entry Abdomin: soft, non-tender, without masses or organomegaly no shifting dullness or ascites. EXT:no erythema, induration, or nodules   Lab Results: Lab Results  Component Value Date   WBC 5.9 05/17/2016   HGB 14.2 05/17/2016   HCT 42.6 05/17/2016   MCV 85.2 05/17/2016   PLT 171 05/17/2016     Chemistry      Component Value Date/Time   NA 139 01/24/2016 0919   K 4.7 01/24/2016 0919   CL 100 01/24/2016 0919   CO2 33 (H) 01/24/2016 0919   BUN 10 01/24/2016 0919   CREATININE 0.88 01/24/2016 0919      Component Value Date/Time   CALCIUM 10.0 01/24/2016 0919   ALKPHOS 77 07/12/2015 0831   AST 33 07/12/2015 0831   ALT 61 (H) 07/12/2015 0831   BILITOT 0.9 07/12/2015 0831       Impression and Plan:  60 year old gentleman with the following issues:  1. The prostate cancer diagnosed in October 2016. His PSA was 60.98 and a Gleason score of 4+5 = 9. He is status post radical prostatectomy on 11/24/2015 for locally advanced disease with his disease showed positive margins and extraprostatic extension. He had positive lymph node as well as persistent elevation in his PSA. His follow-up PSA in June 2017 showed elevated PSA of 3.88.  He is currently receiving androgen deprivation therapy in addition to [facility] which she started 2 weeks ago. He tolerated this medication well with very minor complications. The plan is to continue with the same dose and schedule and monitor his PSA. Dose reductions can be considered in the future continues to have issues with high blood pressure or excessive fatigue.  2. Androgen depravation: This is administered under the care of Dr. Tresa [name] which I recommended to continue indefinitely.  3. Hypertension:  He is already on lisinopril and hydrochlorothiazide combination. I asked him to continue to monitor his blood pressure and we will communicate with Dr. Larose Kells for potential adjustment of his blood pressure medication in the future.  4. Hypokalemia: This will be checked today and replace as needed.  5. LFT monitoring: He is will be repeated today and monitored periodically.  Follow-up: Will be in 4 weeks to follow his progress.     Zola Button, MD 8/11/20173:17 PM

## 2016-05-18 LAB — PSA: Prostate Specific Ag, Serum: 0.1 ng/mL (ref 0.0–4.0)

## 2016-05-20 ENCOUNTER — Telehealth: Payer: Self-pay | Admitting: *Deleted

## 2016-05-20 NOTE — Telephone Encounter (Signed)
-----   Message from Wyatt Portela, MD sent at 05/20/2016  8:28 AM EDT ----- Please call his PSA. Down this time.

## 2016-05-20 NOTE — Telephone Encounter (Signed)
As noted below by Dr. Alen Blew, I left a message with patient informing him of his PSA results. Instructed patient to call [place] if he had any questions or concerns.

## 2016-05-24 ENCOUNTER — Other Ambulatory Visit: Payer: Self-pay | Admitting: *Deleted

## 2016-05-24 MED ORDER — ABIRATERONE ACETATE 250 MG PO TABS: 1000.0000 mg | ORAL_TABLET | Freq: Every day | ORAL | 0 refills | Status: DC

## 2016-06-03 ENCOUNTER — Encounter: Payer: Self-pay | Admitting: Internal Medicine

## 2016-06-03 ENCOUNTER — Telehealth: Payer: Self-pay | Admitting: Pharmacist

## 2016-06-03 NOTE — Telephone Encounter (Signed)
Oral Chemotherapy Follow-Up Form  Original Start date of oral chemotherapy: 05/02/16   Called patient today to follow up regarding patient's oral chemotherapy medication: Zytiga  Pt is doing well today. No significant SE.  He monitors his BP at home and reports that his systolic has been running around 140's.   Pt reports he has received his next month's supply in the mail.  Will follow up and call patient again in 1 month. Instructed patient to call if he has any issues before then.  Thank you,  Nuala Alpha, PharmD Oral Chemotherapy Clinic [phone]

## 2016-06-05 ENCOUNTER — Other Ambulatory Visit: Payer: Self-pay | Admitting: *Deleted

## 2016-06-05 MED ORDER — ABIRATERONE ACETATE 250 MG PO TABS: 1000.0000 mg | ORAL_TABLET | Freq: Every day | ORAL | 0 refills | Status: DC

## 2016-06-18 ENCOUNTER — Other Ambulatory Visit (HOSPITAL_BASED_OUTPATIENT_CLINIC_OR_DEPARTMENT_OTHER): Payer: BLUE CROSS/BLUE SHIELD

## 2016-06-18 ENCOUNTER — Telehealth: Payer: Self-pay | Admitting: Oncology

## 2016-06-18 ENCOUNTER — Ambulatory Visit (HOSPITAL_BASED_OUTPATIENT_CLINIC_OR_DEPARTMENT_OTHER): Payer: BLUE CROSS/BLUE SHIELD | Admitting: Oncology

## 2016-06-18 VITALS — BP 149/82 | HR 63 | Temp 98.2°F | Resp 18 | Wt 243.9 lb

## 2016-06-18 DIAGNOSIS — C61 Malignant neoplasm of prostate: Secondary | ICD-10-CM

## 2016-06-18 DIAGNOSIS — E876 Hypokalemia: Secondary | ICD-10-CM

## 2016-06-18 DIAGNOSIS — R232 Flushing: Secondary | ICD-10-CM | POA: Diagnosis not present

## 2016-06-18 LAB — COMPREHENSIVE METABOLIC PANEL
ALT: 46 U/L (ref 0–55)
AST: 33 U/L (ref 5–34)
Albumin: 4.2 g/dL (ref 3.5–5.0)
Alkaline Phosphatase: 80 U/L (ref 40–150)
Anion Gap: 10 mEq/L (ref 3–11)
BUN: 12.8 mg/dL (ref 7.0–26.0)
CHLORIDE: 104 meq/L (ref 98–109)
CO2: 28 meq/L (ref 22–29)
Calcium: 9.9 mg/dL (ref 8.4–10.4)
Creatinine: 0.8 mg/dL (ref 0.7–1.3)
EGFR: >90 mL/min/{1.73_m2} (ref >90)
GLUCOSE: 101 mg/dL (ref 70–140)
POTASSIUM: 4.2 meq/L (ref 3.5–5.1)
SODIUM: 142 meq/L (ref 136–145)
Total Bilirubin: 0.74 mg/dL (ref 0.20–1.20)
Total Protein: 8.4 g/dL — ABNORMAL HIGH (ref 6.4–8.3)

## 2016-06-18 LAB — CBC WITH DIFFERENTIAL/PLATELET
BASO%: 1.4 % (ref 0.0–2.0)
BASOS ABS: 0.1 10*3/uL (ref 0.0–0.1)
EOS%: 2.4 % (ref 0.0–7.0)
Eosinophils Absolute: 0.1 10*3/uL (ref 0.0–0.5)
HCT: 45.1 % (ref 38.4–49.9)
HEMOGLOBIN: 14.9 g/dL (ref 13.0–17.1)
LYMPH%: 28.9 % (ref 14.0–49.0)
MCH: 28.9 pg (ref 27.2–33.4)
MCHC: 33.1 g/dL (ref 32.0–36.0)
MCV: 87.3 fL (ref 79.3–98.0)
MONO#: 0.3 10*3/uL (ref 0.1–0.9)
MONO%: 5.2 % (ref 0.0–14.0)
NEUT#: 3.9 10*3/uL (ref 1.5–6.5)
NEUT%: 62.1 % (ref 39.0–75.0)
Platelets: 173 10*3/uL (ref 140–400)
RBC: 5.16 10*6/uL (ref 4.20–5.82)
RDW: 13.6 % (ref 11.0–14.6)
WBC: 6.3 10*3/uL (ref 4.0–10.3)
lymph#: 1.8 10*3/uL (ref 0.9–3.3)

## 2016-06-18 NOTE — Progress Notes (Signed)
Hematology and Oncology Follow Up Visit  Gary [name] [id number] March 10, 1956 60 y.o. 06/18/2016 2:59 PM [name], MDPaz, [name], MD   Principle Diagnosis:  60 year old gentleman diagnosed with prostate cancer in October 2016. His PSA was 60.98 and a Gleason score 4+5 = 9. He has biochemically recurrent advanced disease.   Prior Therapy:   He underwent robotic-assisted laparoscopic radical prostatectomy and bilateral pelvic lymphadenectomy on 11/24/2015. The final pathology revealed a prostate adenocarcinoma Gleason score 4+5 = 9 with extraprostatic extension is identified at the right anterior apex and mid, bilateral posterior from the apex and bladder neck area. Bilateral seminal vesicle infiltration was identified. Lymphovascular invasion is identified. Margins of resections were positive. One positive lymph node was identified out of a 9 examined. The final pathological staging was T3b N1.  His follow-up PSA remained elevated with a PSA on 03/07/2016 was 3.88.  Current therapy:  Androgen deprivation under the care of Dr. Tresa [name]. Zytiga 1000 mg daily with prednisone 5 mg daily started in July 2017.  Interim History: Mr. Scripter presents today for a follow-up visit. Since the last visit, he reports no major changes in his health. He remains reasonably active and performs activities of daily living. He is walking the dog and walks daily which have helped his overall well-being.  He continues to take Zytiga with prednisone without any major complications. He denied any arthralgias, myalgias or fatigue. He denied any prednisone related complications such as hypoglycemia or weight gain. He does report some occasional hot flashes and breast tenderness.   He does not report any headaches, blurry vision, syncope or seizures. He does not report any fevers or chills or sweats. He does not report any cough, wheezing or hemoptysis. Does not report any nausea, vomiting or abdominal pain. Does not report  any other satiety or change in his bowel habits. He does not report any frequency, urgency or hematuria. He does not report any dysuria. He does not report any skeletal complaints. Remaining review of systems unremarkable.   Medications: I have reviewed the patient's current medications.  Current Outpatient Prescriptions  Medication Sig Dispense Refill  . abiraterone Acetate (ZYTIGA) 250 MG tablet Take 4 tablets (1,000 mg total) by mouth daily. Take on an empty stomach 1 hour before or 2 hours after a meal 120 tablet 0  . aspirin 81 MG tablet Take 81 mg by mouth daily.    Marland Kitchen atorvastatin (LIPITOR) 20 MG tablet Take 1 tablet (20 mg total) by mouth daily. 90 tablet 1  . azelastine (ASTELIN) 0.1 % nasal spray Place 2 sprays into both nostrils at bedtime as needed for rhinitis. Use in each nostril as directed 30 mL 3  . lisinopril-hydrochlorothiazide (PRINZIDE,ZESTORETIC) 10-12.5 MG tablet Take 1 tablet by mouth daily. 90 tablet 1  . nitroGLYCERIN (NITROSTAT) 0.4 MG SL tablet Place 1 tablet (0.4 mg total) under the tongue every 5 (five) minutes as needed for chest pain. 25 tablet 3  . permethrin (ELIMITE) 5 % cream Apply 1 application topically once. 60 g 1  . predniSONE (DELTASONE) 5 MG tablet Take 1 tablet (5 mg total) by mouth daily with breakfast. 30 tablet 3   No current facility-administered medications for this visit.      Allergies: No Known Allergies  Past Medical History, Surgical history, Social history, and Family History were reviewed and updated.   Physical Exam: Blood pressure (!) 149/82, pulse 63, temperature 98.2 F (36.8 C), temperature source Oral, resp. rate 18, weight 243 lb 14.4 oz (  110.6 kg), SpO2 100 %. ECOG: 0 General appearance: Well-appearing gentleman without distress. Head: Normocephalic, without obvious abnormality no oral thrush noted. Neck: no adenopathy Lymph nodes: Cervical, supraclavicular, and axillary nodes normal. Heart:regular rate and rhythm, S1, S2  normal, no murmur, click, rub or gallop Lung:chest clear, no wheezing, rales, normal symmetric air entry Abdomin: soft, non-tender, without masses or organomegaly no rebound or guarding. EXT:no erythema, induration, or nodules   Lab Results: Lab Results  Component Value Date   WBC 6.3 06/18/2016   HGB 14.9 06/18/2016   HCT 45.1 06/18/2016   MCV 87.3 06/18/2016   PLT 173 06/18/2016     Chemistry      Component Value Date/Time   NA 139 05/17/2016 1446   K 3.7 05/17/2016 1446   CL 100 01/24/2016 0919   CO2 25 05/17/2016 1446   BUN 11.5 05/17/2016 1446   CREATININE 0.8 05/17/2016 1446      Component Value Date/Time   CALCIUM 9.7 05/17/2016 1446   ALKPHOS 82 05/17/2016 1446   AST 25 05/17/2016 1446   ALT 45 05/17/2016 1446   BILITOT 0.92 05/17/2016 1446       Impression and Plan:  60 year old gentleman with the following issues:  1. The prostate cancer diagnosed in October 2016. His PSA was 60.98 and a Gleason score of 4+5 = 9. He is status post radical prostatectomy on 11/24/2015 for locally advanced disease with his disease showed positive margins and extraprostatic extension. He had positive lymph node as well as persistent elevation in his PSA. His follow-up PSA in June 2017 showed elevated PSA of 3.88.  He is currently receiving androgen deprivation therapy in addition to [facility] which He started for close to 6 weeks ago now. He continues to tolerate this combination without any major complications except for hot flashes. His PSA did drop down to 0.1 indicating excellent response. The plan is to continue with the same dose and schedule and recheck him in 6 weeks.  2. Androgen depravation: This is administered under the care of Dr. Tresa [name] which I recommended to continue indefinitely.  3. Hypertension: Her blood pressure remains within normal range without any major adjustments.  4. Hypokalemia: This will be checked today and replace as needed.  5. LFT monitoring: He is  will be repeated today and monitored periodically. Last liver function tests 4 weeks ago was within normal range.  6. Follow-up: Will be in 4 weeks to follow his progress.     Zola Button, MD 9/12/20172:59 PM

## 2016-06-18 NOTE — Telephone Encounter (Signed)
Avs report and appointment schd given per 06/18/16 los.

## 2016-06-19 LAB — PSA: Prostate Specific Ag, Serum: <0.1 ng/mL (ref 0.0–4.0)

## 2016-07-04 ENCOUNTER — Other Ambulatory Visit: Payer: Self-pay | Admitting: *Deleted

## 2016-07-04 ENCOUNTER — Encounter: Payer: Self-pay | Admitting: *Deleted

## 2016-07-04 MED ORDER — ABIRATERONE ACETATE 250 MG PO TABS: 1000.0000 mg | ORAL_TABLET | Freq: Every day | ORAL | 0 refills | Status: DC

## 2016-07-25 ENCOUNTER — Other Ambulatory Visit: Payer: BLUE CROSS/BLUE SHIELD

## 2016-07-31 ENCOUNTER — Ambulatory Visit (INDEPENDENT_AMBULATORY_CARE_PROVIDER_SITE_OTHER): Payer: BLUE CROSS/BLUE SHIELD | Admitting: Internal Medicine

## 2016-07-31 ENCOUNTER — Encounter: Payer: Self-pay | Admitting: Internal Medicine

## 2016-07-31 VITALS — BP 124/66 | HR 57 | Temp 97.9°F | Resp 14 | Ht 72.0 in | Wt 244.4 lb

## 2016-07-31 DIAGNOSIS — Z0000 Encounter for general adult medical examination without abnormal findings: Secondary | ICD-10-CM

## 2016-07-31 DIAGNOSIS — R739 Hyperglycemia, unspecified: Secondary | ICD-10-CM

## 2016-07-31 LAB — LIPID PANEL
CHOL/HDL RATIO: 4
CHOLESTEROL: 160 mg/dL (ref 0–200)
HDL: 44.4 mg/dL (ref >39.00)
LDL Cholesterol: 85 mg/dL (ref 0–99)
NonHDL: 115.2
TRIGLYCERIDES: 151 mg/dL — AB (ref 0.0–149.0)
VLDL: 30.2 mg/dL (ref 0.0–40.0)

## 2016-07-31 LAB — HEMOGLOBIN A1C: Hgb A1c MFr Bld: 5.4 % (ref 4.6–6.5)

## 2016-07-31 LAB — TSH: TSH: 3.87 u[IU]/mL (ref 0.35–4.50)

## 2016-07-31 MED ORDER — AZELASTINE HCL 0.1 % NA SOLN: 2.0000 | Freq: Every evening | NASAL | 3 refills | Status: DC | PRN

## 2016-07-31 NOTE — Assessment & Plan Note (Signed)
Prediabetes: Check A1c HTN: Seems controlled on Zestoretic Hyperlipidemia: On Lipitor, check labs. Recent LFTs normal Anxiety: Temporarily stopped sertraline but "wife told me to go back on it", currently doing well emotionally on a low dose of sertraline CAD: On aspirin, Lipitor, lisinopril. Has not seen cardiology since 11/2014 but is is stable and dealing with prostate cancer. Prostate cancer, metastatic, on androgen deprivation (injection)  and Zytiga - prednisone: Last PSA very good, good response  EtOH: Drinks a sporadically RTC 6 months

## 2016-07-31 NOTE — Assessment & Plan Note (Signed)
Td 2015, pnm 23-2015; prevnar--2017 Had a flu shot  S/p colonoscopy (~ 2011?) showed adenomatous polyps Cscope 05-2015: mild erythema, no polyps, no bx, next 5 years  H/o Prostate cancer  . Labs-- FLP, A1c, TSH, PSA  Diet and exercise discussed although he seems to be doing well, walks the dog twice a day. Trying to eat healthy.

## 2016-07-31 NOTE — Patient Instructions (Signed)
GO TO THE LAB : Get the blood work     GO TO THE FRONT DESK Schedule your next appointment for a  Check up in 6 months

## 2016-07-31 NOTE — Progress Notes (Signed)
Pre visit review using our clinic review tool, if applicable. No additional management support is needed unless otherwise documented below in the visit note. 

## 2016-07-31 NOTE — Progress Notes (Signed)
Subjective:    Patient ID: Gary [name], male    DOB: 23-Apr-1956, 60 y.o.   MRN: [id number]  DOS:  07/31/2016 Type of visit - description : cpx Interval history: History of prostate cancer, oncology note reviewed. Patient is satisfied with the treatment and feeling well.    Review of Systems Constitutional: No fever. No chills. No unexplained wt changes. No unusual sweats  HEENT: No dental problems, no ear discharge, no facial swelling, no voice changes. No eye discharge, no eye  redness , no  intolerance to light   Respiratory: No wheezing , no  difficulty breathing. No cough , no mucus production  Cardiovascular: No CP, no leg swelling , no  Palpitations  GI: no nausea, no vomiting, no diarrhea , no  abdominal pain.  No blood in the stools. No dysphagia, no odynophagia    Endocrine: No polyphagia, no polyuria , no polydipsia  GU: No dysuria, gross hematuria, difficulty urinating. No urinary urgency, no frequency.  Musculoskeletal: No joint swellings or unusual aches or pains  Skin: No change in the color of the skin, palor , no  Rash  Allergic, immunologic: No environmental allergies , no  food allergies  Neurological:  Over the last month had about 3 episodes of mild, global headache, not associated with nausea, not the worse of life. Bilateral. No dizziness no  Syncope.  No diplopia, no slurred, no slurred speech, no motor deficits, no facial  Numbness  Hematological: No enlarged lymph nodes, no easy bruising , no unusual bleedings  Psychiatry: No suicidal ideas, no hallucinations, no beavior problems, no confusion.  No unusual/severe anxiety, no depression   Past Medical History:  Diagnosis Date  . Anxiety   . CAD (coronary artery disease) 10-2012   h/o stents   . Elevated PSA    Prostate Bx w/ Dr. Tresa [name]  . EtOH dependence ([place])   . Family history of adverse reaction to anesthesia    made father disoriented  . Gross hematuria   . Hepatic steatosis   .  Hyperlipidemia LDL goal < 70   . Hypertension   . Metastatic adenocarcinoma to prostate ([place]) 2017   Gleason score 9, metastasis to R external Iliac node and large extraprostatic extension at prostatectomy  . Multiple sclerosis ([place]) 1991   Bilateral optic neuritis, treated and resolved but felt to be a manifestation of MS.  . Renal cyst, left    Bosniak 2K (indeterminant) hyperdense cyst CT 2014, Stable CT in 2016  . Ulcerative colitis [facility])     Past Surgical History:  Procedure Laterality Date  . FOOT SURGERY     R toe Fx (screws),  L dorsal FX (screws)  . LEFT HEART CATHETERIZATION WITH CORONARY ANGIOGRAM N/A 10/22/2012   Procedure: LEFT HEART CATHETERIZATION WITH CORONARY ANGIOGRAM;  Surgeon: Sinclair Grooms, MD;  Location: [facility] CATH LAB;  Service: Cardiovascular;  Laterality: N/A;  . LYMPHADENECTOMY Bilateral 11/24/2015   Procedure: BILATERAL PELVIC LYMPHADENECTOMY;  Surgeon: Alexis Frock, MD;  Location: [facility] ORS;  Service: Urology;  Laterality: Bilateral;  . PERCUTANEOUS CORONARY STENT INTERVENTION (PCI-S) N/A 10/23/2012   Procedure: PERCUTANEOUS CORONARY STENT INTERVENTION (PCI-S);  Surgeon: Sinclair Grooms, MD;  Location: [facility] CATH LAB;  Service: Cardiovascular;  Laterality: N/A;  . PROSTATE BIOPSY  09/2015   Dr. Tresa [name]  . ROBOT ASSISTED LAPAROSCOPIC RADICAL PROSTATECTOMY N/A 11/24/2015   Procedure: XI ROBOTIC ASSISTED LAPAROSCOPIC RADICAL PROSTATECTOMY WITH INDOCYANINE GREEN DYE;  Surgeon: Alexis Frock, MD;  Location: [facility] ORS;  Service: Urology;  Laterality: N/A;  . VASECTOMY      Social History   Social History  . Marital status: Married    Spouse name: N/A  . Number of children: 1  . Years of education: N/A   Occupational History  . Managment  [employer]   Social History [name] Topics  . Smoking status: Former Smoker    Types: Cigarettes    Quit date: 07/18/1990  . Smokeless tobacco: Former Systems developer     Comment: 1991  . Alcohol use Yes     Comment: h/o abuse, currently has  slow done but still drinks sometimes   . Drug use: No  . Sexual activity: Yes   Other Topics Concern  . Not on file   Social History Narrative   Household-- pt and wife   Has an adult son     Family History  Problem Relation Age of Onset  . Dementia Mother   . Hypertension Mother   . Coronary artery disease Father   . Diabetes Mellitus II Father   . Coronary artery disease Brother   . Hypertension Brother   . Colon cancer Neg Hx        Medication List       Accurate as of 07/31/16  6:02 PM. Always use your most recent med list.          abiraterone Acetate 250 MG tablet Commonly known as:  ZYTIGA Take 4 tablets (1,000 mg total) by mouth daily. Take on an empty stomach 1 hour before or 2 hours after a meal   aspirin 81 MG tablet Take 81 mg by mouth daily.   atorvastatin 20 MG tablet Commonly known as:  LIPITOR Take 1 tablet (20 mg total) by mouth daily.   azelastine 0.1 % nasal spray Commonly known as:  ASTELIN Place 2 sprays into both nostrils at bedtime as needed for rhinitis. Use in each nostril as directed   lisinopril-hydrochlorothiazide 10-12.5 MG tablet Commonly known as:  PRINZIDE,ZESTORETIC Take 1 tablet by mouth daily.   nitroGLYCERIN 0.4 MG SL tablet Commonly known as:  NITROSTAT Place 1 tablet (0.4 mg total) under the tongue every 5 (five) minutes as needed for chest pain.   predniSONE 5 MG tablet Commonly known as:  DELTASONE Take 1 tablet (5 mg total) by mouth daily with breakfast.   sertraline 25 MG tablet Commonly known as:  ZOLOFT Take 25 mg by mouth daily.          Objective:   Physical Exam BP 124/66 (BP Location: Left Arm, Patient Position: Sitting, Cuff Size: Normal)   Pulse (!) 57   Temp 97.9 F (36.6 C) (Oral)   Resp 14   Ht 6' (1.829 m)   Wt 244 lb 6 oz (110.8 kg)   SpO2 98%   BMI 33.14 kg/m   General:   Well developed, well nourished . NAD.  Neck: No  thyromegaly  HEENT:  Normocephalic . Face symmetric,  atraumatic Lungs:  CTA B Normal respiratory effort, no intercostal retractions, no accessory muscle use. Heart: RRR,  no murmur.  No pretibial edema bilaterally  Abdomen:  Not distended, soft, non-tender. No rebound or rigidity.   Skin: Exposed areas without rash. Not pale. Not jaundice Neurologic:  alert & oriented X3.  Speech normal, gait appropriate for age and unassisted Strength symmetric and appropriate for age.  Psych: Cognition and judgment appear intact.  Cooperative with normal attention span and concentration.  Behavior appropriate. No anxious or depressed  appearing.    Assessment & Plan:   Assessment > Pre-diabetes HTN Hyperlipidemia Anxiety CAD s/p stents 2014  EtOH dependency Ulcerative colitis- last cscope 05-2015 Metastatic prostate cancer, DX 11-2015, radical prostatectomy 11-2015 --  continent as off  07-2016 Hematuria, saw urology 01-2013 reviewed: CT show a renal cyst, cystoscopy negative, they recommended follow-up MRI for the renal cyst. MS  Dx 1991 (B neuritis, rx, resolved, felt to be d/t MS)   PLAN Prediabetes: Check A1c HTN: Seems controlled on Zestoretic Hyperlipidemia: On Lipitor, check labs. Recent LFTs normal Anxiety: Temporarily stopped sertraline but "wife told me to go back on it", currently doing well emotionally on a low dose of sertraline CAD: On aspirin, Lipitor, lisinopril. Has not seen cardiology since 11/2014 but is is stable and dealing with prostate cancer. Prostate cancer, metastatic, on androgen deprivation (injection)  and Zytiga - prednisone: Last PSA very good, good response  EtOH: Drinks a sporadically RTC 6 months

## 2016-08-01 ENCOUNTER — Other Ambulatory Visit: Payer: Self-pay | Admitting: *Deleted

## 2016-08-01 DIAGNOSIS — R310 Gross hematuria: Secondary | ICD-10-CM | POA: Diagnosis not present

## 2016-08-01 DIAGNOSIS — C775 Secondary and unspecified malignant neoplasm of intrapelvic lymph nodes: Secondary | ICD-10-CM | POA: Diagnosis not present

## 2016-08-01 DIAGNOSIS — N393 Stress incontinence (female) (male): Secondary | ICD-10-CM | POA: Diagnosis not present

## 2016-08-01 DIAGNOSIS — N281 Cyst of kidney, acquired: Secondary | ICD-10-CM | POA: Diagnosis not present

## 2016-08-01 MED ORDER — ABIRATERONE ACETATE 250 MG PO TABS: 1000.0000 mg | ORAL_TABLET | Freq: Every day | ORAL | 0 refills | Status: DC

## 2016-08-07 ENCOUNTER — Ambulatory Visit (HOSPITAL_BASED_OUTPATIENT_CLINIC_OR_DEPARTMENT_OTHER): Payer: BLUE CROSS/BLUE SHIELD | Admitting: Oncology

## 2016-08-07 ENCOUNTER — Other Ambulatory Visit (HOSPITAL_BASED_OUTPATIENT_CLINIC_OR_DEPARTMENT_OTHER): Payer: BLUE CROSS/BLUE SHIELD

## 2016-08-07 VITALS — BP 157/82 | HR 63 | Temp 98.4°F | Resp 17 | Ht 72.0 in | Wt 243.4 lb

## 2016-08-07 DIAGNOSIS — R232 Flushing: Secondary | ICD-10-CM | POA: Diagnosis not present

## 2016-08-07 DIAGNOSIS — C61 Malignant neoplasm of prostate: Secondary | ICD-10-CM | POA: Diagnosis not present

## 2016-08-07 LAB — CBC WITH DIFFERENTIAL/PLATELET
BASO%: 1.4 % (ref 0.0–2.0)
BASOS ABS: 0.1 10*3/uL (ref 0.0–0.1)
EOS ABS: 0.2 10*3/uL (ref 0.0–0.5)
EOS%: 3.3 % (ref 0.0–7.0)
HCT: 42.9 % (ref 38.4–49.9)
HEMOGLOBIN: 14.4 g/dL (ref 13.0–17.1)
LYMPH%: 44.3 % (ref 14.0–49.0)
MCH: 29 pg (ref 27.2–33.4)
MCHC: 33.5 g/dL (ref 32.0–36.0)
MCV: 86.6 fL (ref 79.3–98.0)
MONO#: 0.4 10*3/uL (ref 0.1–0.9)
MONO%: 7.9 % (ref 0.0–14.0)
NEUT#: 2.2 10*3/uL (ref 1.5–6.5)
NEUT%: 43.1 % (ref 39.0–75.0)
PLATELETS: 168 10*3/uL (ref 140–400)
RBC: 4.96 10*6/uL (ref 4.20–5.82)
RDW: 13.1 % (ref 11.0–14.6)
WBC: 5.1 10*3/uL (ref 4.0–10.3)
lymph#: 2.3 10*3/uL (ref 0.9–3.3)

## 2016-08-07 LAB — COMPREHENSIVE METABOLIC PANEL
ALBUMIN: 3.9 g/dL (ref 3.5–5.0)
ALK PHOS: 86 U/L (ref 40–150)
ALT: 30 U/L (ref 0–55)
ANION GAP: 10 meq/L (ref 3–11)
AST: 20 U/L (ref 5–34)
BILIRUBIN TOTAL: 0.56 mg/dL (ref 0.20–1.20)
BUN: 13.1 mg/dL (ref 7.0–26.0)
CO2: 27 mEq/L (ref 22–29)
Calcium: 9.5 mg/dL (ref 8.4–10.4)
Chloride: 107 mEq/L (ref 98–109)
Creatinine: 0.8 mg/dL (ref 0.7–1.3)
GLUCOSE: 107 mg/dL (ref 70–140)
POTASSIUM: 3.7 meq/L (ref 3.5–5.1)
SODIUM: 144 meq/L (ref 136–145)
TOTAL PROTEIN: 7.7 g/dL (ref 6.4–8.3)

## 2016-08-07 NOTE — Progress Notes (Signed)
Hematology and Oncology Follow Up Visit  Gary Gary [name] [id number] 23-Jul-1956 60 y.o. 08/07/2016 9:44 AM Gary Gary [name], MDPaz, Gary Berthold, MD   Principle Diagnosis:  60 year old gentleman diagnosed with prostate cancer in October 2016. His PSA was 60.98 and a Gleason score 4+5 = 9. He has biochemically recurrent advanced disease.   Prior Therapy:   He underwent robotic-assisted laparoscopic radical prostatectomy and bilateral pelvic lymphadenectomy on 11/24/2015. The final pathology revealed a prostate adenocarcinoma Gleason score 4+5 = 9 with extraprostatic extension is identified at the right anterior apex and mid, bilateral posterior from the apex and bladder neck area. Bilateral seminal vesicle infiltration was identified. Lymphovascular invasion is identified. Margins of resections were positive. One positive lymph node was identified out of a 9 examined. The final pathological staging was T3b N1.  His follow-up PSA remained elevated with a PSA on 03/07/2016 was 3.88.  Current therapy:  Androgen deprivation under the care of Dr. Tresa [name]. Zytiga 1000 mg daily with prednisone 5 mg daily started in July 2017.  Interim History: Gary Gary [name] presents today for a follow-up visit. Since the last visit, he continues to do very well without any complaints. He continues to take Zytiga with prednisone without any major complications. He denied any arthralgias, myalgias or fatigue. He denied any prednisone related complications such as hypoglycemia or weight gain. He does report some occasional hot flashes and breast tenderness. He remains reasonably active and performs activities of daily living. He continues to exercise mildly including taking walks around his house.  He denied any genitourinary complaints of hematuria or dysuria. He denies any issues obtaining or taking Zytiga at this time.   He does not report any headaches, blurry vision, syncope or seizures. He does not report any fevers or chills or  sweats. He does not report any cough, wheezing or hemoptysis. Does not report any nausea, vomiting or abdominal pain. Does not report any other satiety or change in his bowel habits. He does not report any frequency, urgency or hematuria. He does not report any dysuria. He does not report any skeletal complaints. Remaining review of systems unremarkable.   Medications: I have reviewed the patient's current medications.  Current Outpatient Prescriptions  Medication Sig Dispense Refill  . abiraterone Acetate (ZYTIGA) 250 MG tablet Take 4 tablets (1,000 mg total) by mouth daily. Take on an empty stomach 1 hour before or 2 hours after a meal 120 tablet 0  . aspirin 81 MG tablet Take 81 mg by mouth daily.    Marland Kitchen atorvastatin (LIPITOR) 20 MG tablet Take 1 tablet (20 mg total) by mouth daily. 90 tablet 1  . azelastine (ASTELIN) 0.1 % nasal spray Place 2 sprays into both nostrils at bedtime as needed for rhinitis. Use in each nostril as directed 30 mL 3  . lisinopril-hydrochlorothiazide (PRINZIDE,ZESTORETIC) 10-12.5 MG tablet Take 1 tablet by mouth daily. 90 tablet 1  . nitroGLYCERIN (NITROSTAT) 0.4 MG SL tablet Place 1 tablet (0.4 mg total) under the tongue every 5 (five) minutes as needed for chest pain. 25 tablet 3  . predniSONE (DELTASONE) 5 MG tablet Take 1 tablet (5 mg total) by mouth daily with breakfast. 30 tablet 3  . sertraline (ZOLOFT) 25 MG tablet Take 25 mg by mouth daily.     No current facility-administered medications for this visit.      Allergies: No Known Allergies  Past Medical History, Surgical history, Social history, and Family History were reviewed and updated.   Physical Exam: Blood pressure (!) 157/82,  pulse 63, temperature 98.4 F (36.9 C), temperature source Oral, resp. rate 17, height 6' (1.829 m), weight 243 lb 6.4 oz (110.4 kg), SpO2 96 %. ECOG: 0 General appearance: Alert, awake gentleman without distress. Head: Normocephalic, without obvious abnormality no oral  ulcers or lesions. Neck: no adenopathy Lymph nodes: Cervical, supraclavicular, and axillary nodes normal. Heart:regular rate and rhythm, S1, S2 normal, no murmur, click, rub or gallop Lung:chest clear, no wheezing, rales, normal symmetric air entry Abdomin: soft, non-tender, without masses or organomegaly no shifting dullness or ascites. EXT:no erythema, induration, or nodules   Lab Results: Lab Results  Component Value Date   WBC 5.1 08/07/2016   HGB 14.4 08/07/2016   HCT 42.9 08/07/2016   MCV 86.6 08/07/2016   PLT 168 08/07/2016     Chemistry      Component Value Date/Time   NA 142 06/18/2016 1431   K 4.2 06/18/2016 1431   CL 100 01/24/2016 0919   CO2 28 06/18/2016 1431   BUN 12.8 06/18/2016 1431   CREATININE 0.8 06/18/2016 1431      Component Value Date/Time   CALCIUM 9.9 06/18/2016 1431   ALKPHOS 80 06/18/2016 1431   AST 33 06/18/2016 1431   ALT 46 06/18/2016 1431   BILITOT 0.74 06/18/2016 1431     Results for Gary, Gary [name] (MRN [id number]) as of 08/07/2016 09:40  Ref. Range 05/17/2016 14:46 06/18/2016 14:31  PSA Latest Ref Range: 0.0 - 4.0 ng/mL 0.1 <0.1    Impression and Plan:  60 year old gentleman with the following issues:  1. The prostate cancer diagnosed in October 2016. His PSA was 60.98 and a Gleason score of 4+5 = 9. He is status post radical prostatectomy on 11/24/2015 for locally advanced disease with his disease showed positive margins and extraprostatic extension. He had positive lymph node as well as persistent elevation in his PSA. His follow-up PSA in June 2017 showed elevated PSA of 3.88.  He is currently receiving androgen deprivation therapy in addition to [facility] without complications. His PSA remains nearly undetectable with very little to no complications related to this medication. Risks and benefits of continuing Zytiga were reviewed today is agreeable to continue.  2. Androgen depravation: This is administered under the care of Dr. Tresa [name]  which I recommended to continue indefinitely.  3. Hypertension: Gary [name] blood pressure remains within normal range without any major adjustments.  4. Hypokalemia: His potassium has within normal range and replacement is not needed.  5. LFT monitoring: He is will be repeated today and monitored periodically. Last liver function tests 4 weeks ago was within normal range.  6. Follow-up: Will be in 8 weeks to follow his progress.     [facility], MD 11/1/20179:44 AM

## 2016-08-08 LAB — PSA

## 2016-08-21 ENCOUNTER — Telehealth: Payer: Self-pay | Admitting: General Practice

## 2016-08-21 NOTE — Telephone Encounter (Signed)
Spoke w/pt confirmed January 2018 appt.

## 2016-09-02 ENCOUNTER — Other Ambulatory Visit: Payer: Self-pay | Admitting: *Deleted

## 2016-09-02 MED ORDER — ABIRATERONE ACETATE 250 MG PO TABS: 1000.0000 mg | ORAL_TABLET | Freq: Every day | ORAL | 0 refills | Status: DC

## 2016-09-11 ENCOUNTER — Other Ambulatory Visit: Payer: Self-pay | Admitting: *Deleted

## 2016-09-11 ENCOUNTER — Encounter: Payer: Self-pay | Admitting: *Deleted

## 2016-09-11 MED ORDER — PREDNISONE 5 MG PO TABS: 5.0000 mg | ORAL_TABLET | Freq: Every day | ORAL | 3 refills | Status: DC

## 2016-09-19 ENCOUNTER — Encounter: Payer: Self-pay | Admitting: Medical Oncology

## 2016-09-19 NOTE — Progress Notes (Signed)
Spoke with patient and he states he is doing well with Zytiga. No side effects but he does have some hot flashes at night from the Lupron. He has follow up appointment with Dr. Alen Blew 10/11/16 and he confirmed this.

## 2016-09-20 ENCOUNTER — Other Ambulatory Visit: Payer: Self-pay | Admitting: *Deleted

## 2016-09-20 MED ORDER — ABIRATERONE ACETATE 250 MG PO TABS: 1000.0000 mg | ORAL_TABLET | Freq: Every day | ORAL | 0 refills | Status: DC

## 2016-10-04 ENCOUNTER — Other Ambulatory Visit: Payer: Self-pay | Admitting: Medical

## 2016-10-11 ENCOUNTER — Other Ambulatory Visit (HOSPITAL_BASED_OUTPATIENT_CLINIC_OR_DEPARTMENT_OTHER): Payer: BLUE CROSS/BLUE SHIELD

## 2016-10-11 ENCOUNTER — Telehealth: Payer: Self-pay | Admitting: Oncology

## 2016-10-11 ENCOUNTER — Ambulatory Visit (HOSPITAL_BASED_OUTPATIENT_CLINIC_OR_DEPARTMENT_OTHER): Payer: BLUE CROSS/BLUE SHIELD | Admitting: Oncology

## 2016-10-11 VITALS — BP 166/73 | HR 52 | Temp 96.8°F | Resp 18 | Ht 72.0 in | Wt 245.6 lb

## 2016-10-11 DIAGNOSIS — C61 Malignant neoplasm of prostate: Secondary | ICD-10-CM

## 2016-10-11 LAB — COMPREHENSIVE METABOLIC PANEL
ALT: 29 U/L (ref 0–55)
ANION GAP: 10 meq/L (ref 3–11)
AST: 24 U/L (ref 5–34)
Albumin: 4.5 g/dL (ref 3.5–5.0)
Alkaline Phosphatase: 101 U/L (ref 40–150)
BILIRUBIN TOTAL: 0.75 mg/dL (ref 0.20–1.20)
BUN: 11.2 mg/dL (ref 7.0–26.0)
CO2: 28 meq/L (ref 22–29)
Calcium: 10.1 mg/dL (ref 8.4–10.4)
Chloride: 102 mEq/L (ref 98–109)
Creatinine: 0.8 mg/dL (ref 0.7–1.3)
GLUCOSE: 98 mg/dL (ref 70–140)
POTASSIUM: 4.4 meq/L (ref 3.5–5.1)
SODIUM: 140 meq/L (ref 136–145)
TOTAL PROTEIN: 8.3 g/dL (ref 6.4–8.3)

## 2016-10-11 LAB — CBC WITH DIFFERENTIAL/PLATELET
BASO%: 1.1 % (ref 0.0–2.0)
BASOS ABS: 0.1 10*3/uL (ref 0.0–0.1)
EOS ABS: 0.1 10*3/uL (ref 0.0–0.5)
EOS%: 0.9 % (ref 0.0–7.0)
HCT: 45.2 % (ref 38.4–49.9)
HGB: 15.1 g/dL (ref 13.0–17.1)
LYMPH%: 26.8 % (ref 14.0–49.0)
MCH: 28.8 pg (ref 27.2–33.4)
MCHC: 33.3 g/dL (ref 32.0–36.0)
MCV: 86.6 fL (ref 79.3–98.0)
MONO#: 0.3 10*3/uL (ref 0.1–0.9)
MONO%: 5.1 % (ref 0.0–14.0)
NEUT%: 66.1 % (ref 39.0–75.0)
NEUTROS ABS: 4.4 10*3/uL (ref 1.5–6.5)
PLATELETS: 207 10*3/uL (ref 140–400)
RBC: 5.23 10*6/uL (ref 4.20–5.82)
RDW: 13 % (ref 11.0–14.6)
WBC: 6.7 10*3/uL (ref 4.0–10.3)
lymph#: 1.8 10*3/uL (ref 0.9–3.3)

## 2016-10-11 NOTE — Telephone Encounter (Signed)
Appointments scheduled per 1/5 LOS. Patient given AVS report and calendars with future scheduled appointments. Patient requested to have appointments scheduled for 4/26 around noon.

## 2016-10-11 NOTE — Progress Notes (Signed)
Hematology and Oncology Follow Up Visit  Gary [name] [id number] 20-Sep-1956 61 y.o. 10/11/2016 1:13 PM Gary [name], MDPaz, Gary Berthold, MD   Principle Diagnosis:  61 year old gentleman diagnosed with prostate cancer in October 2016. His PSA was 60.98 and a Gleason score 4+5 = 9. He has biochemically recurrent advanced disease.   Prior Therapy:   He underwent robotic-assisted laparoscopic radical prostatectomy and bilateral pelvic lymphadenectomy on 11/24/2015. The final pathology revealed a prostate adenocarcinoma Gleason score 4+5 = 9 with extraprostatic extension is identified at the right anterior apex and mid, bilateral posterior from the apex and bladder neck area. Bilateral seminal vesicle infiltration was identified. Lymphovascular invasion is identified. Margins of resections were positive. One positive lymph node was identified out of a 9 examined. The final pathological staging was T3b N1.  His follow-up PSA remained elevated with a PSA on 03/07/2016 was 3.88.  Current therapy:  Androgen deprivation under the care of Dr. Tresa [name]. Zytiga 1000 mg daily with prednisone 5 mg daily started in July 2017.  Interim History: Gary [name] presents today for a follow-up visit. Since the last visit, he reports no recent complaints. He denied any signs or symptoms of progression of disease. He continues to take Zytiga with prednisone without any major complications. He denied any arthralgias, myalgias or fatigue. He denied any prednisone related complications such as hypoglycemia or weight gain.  He remains reasonably active and performs activities of daily living. He reports no hematuria, dysuria or any genitourinary complaints.   He does not report any headaches, blurry vision, syncope or seizures. He does not report any fevers or chills or sweats. He does not report any cough, wheezing or hemoptysis. Does not report any nausea, vomiting or abdominal pain. Does not report any other satiety or change in  his bowel habits. He does not report any frequency, urgency or hematuria. He does not report any dysuria. He does not report any skeletal complaints. Remaining review of systems unremarkable.   Medications: I have reviewed the patient's current medications.  Current Outpatient Prescriptions  Medication Sig Dispense Refill  . abiraterone Acetate (ZYTIGA) 250 MG tablet Take 4 tablets (1,000 mg total) by mouth daily. Take on an empty stomach 1 hour before or 2 hours after a meal 120 tablet 0  . aspirin 81 MG tablet Take 81 mg by mouth daily.    Marland Kitchen atorvastatin (LIPITOR) 20 MG tablet Take 1 tablet (20 mg total) by mouth daily. 90 tablet 1  . azelastine (ASTELIN) 0.1 % nasal spray Place 2 sprays into both nostrils at bedtime as needed for rhinitis. Use in each nostril as directed 30 mL 3  . lisinopril-hydrochlorothiazide (PRINZIDE,ZESTORETIC) 10-12.5 MG tablet Take 1 tablet by mouth daily. 90 tablet 1  . meloxicam (MOBIC) 7.5 MG tablet take 1 tablet by mouth once daily 30 tablet 0  . nitroGLYCERIN (NITROSTAT) 0.4 MG SL tablet Place 1 tablet (0.4 mg total) under the tongue every 5 (five) minutes as needed for chest pain. 25 tablet 3  . predniSONE (DELTASONE) 5 MG tablet Take 1 tablet (5 mg total) by mouth daily with breakfast. 30 tablet 3  . sertraline (ZOLOFT) 25 MG tablet Take 25 mg by mouth daily.     No current facility-administered medications for this visit.      Allergies: No Known Allergies  Past Medical History, Surgical history, Social history, and Family History were reviewed and updated.   Physical Exam: Blood pressure (!) 166/73, pulse (!) 52, temperature (!) 96.8 F (36 C),  temperature source Oral, resp. rate 18, height 6' (1.829 m), weight 245 lb 9.6 oz (111.4 kg), SpO2 100 %. ECOG: 0 General appearance: Well-appearing gentleman without distress. Head: Normocephalic, without obvious abnormality no oral ulcers or lesions. Neck: no adenopathy Lymph nodes: Cervical,  supraclavicular, and axillary nodes normal. Heart:regular rate and rhythm, S1, S2 normal, no murmur, click, rub or gallop Lung:chest clear, no wheezing, rales, normal symmetric air entry Abdomin: soft, non-tender, without masses or organomegaly no rebound or guarding. EXT:no erythema, induration, or nodules   Lab Results: Lab Results  Component Value Date   WBC 6.7 10/11/2016   HGB 15.1 10/11/2016   HCT 45.2 10/11/2016   MCV 86.6 10/11/2016   PLT 207 10/11/2016     Chemistry      Component Value Date/Time   NA 144 08/07/2016 0918   K 3.7 08/07/2016 0918   CL 100 01/24/2016 0919   CO2 27 08/07/2016 0918   BUN 13.1 08/07/2016 0918   CREATININE 0.8 08/07/2016 0918      Component Value Date/Time   CALCIUM 9.5 08/07/2016 0918   ALKPHOS 86 08/07/2016 0918   AST 20 08/07/2016 0918   ALT 30 08/07/2016 0918   BILITOT 0.56 08/07/2016 0918      Results for Gary [name], Gary [name] (MRN [id number]) as of 10/11/2016 13:05  Ref. Range 05/17/2016 14:46 06/18/2016 14:31 08/07/2016 09:18  PSA Latest Ref Range: 0.0 - 4.0 ng/mL 0.1 <0.1 <0.1    Impression and Plan:  61 year old gentleman with the following issues:  1. The prostate cancer diagnosed in October 2016. His PSA was 60.98 and a Gleason score of 4+5 = 9. He is status post radical prostatectomy on 11/24/2015 for locally advanced disease with his disease showed positive margins and extraprostatic extension. He had positive lymph node as well as persistent elevation in his PSA. His follow-up PSA in June 2017 showed elevated PSA of 3.88.  He is currently receiving androgen deprivation therapy in addition to [facility] without complications.   His PSA remains nearly undetectable and continues to have excellent quality of life. Risks and benefits of continuing this medication were reviewed today and he is agreeable to continue Zytiga.  2. Androgen depravation: This is administered under the care of Dr. Tresa [name] which I recommended to continue  indefinitely.  3. Hypertension: Her blood pressure slightly elevated although his ambulatory blood pressure monitoring have been within normal range. I have asked him to continue to do so between visits.  4. Hypokalemia: His potassium has within normal range and replacement is not needed. We will continue to monitor his potassium on subsequent visits.  5. LFT monitoring: He is will be repeated today and monitored periodically.   6. Follow-up: Will be in 3 months.Zola Button, MD 1/5/20181:13 PM

## 2016-10-12 LAB — PSA

## 2016-10-14 ENCOUNTER — Telehealth: Payer: Self-pay | Admitting: *Deleted

## 2016-10-14 NOTE — Telephone Encounter (Signed)
As noted below by Dr. Alen Blew, I informed patient of his PSA level. Patient verbalized understanding.

## 2016-10-14 NOTE — Telephone Encounter (Signed)
-----   Message from Wyatt Portela, MD sent at 10/14/2016  8:38 AM EST ----- Please let him know his PSA. Very low

## 2016-10-25 ENCOUNTER — Other Ambulatory Visit: Payer: Self-pay | Admitting: *Deleted

## 2016-10-25 MED ORDER — ABIRATERONE ACETATE 250 MG PO TABS: 1000.0000 mg | ORAL_TABLET | Freq: Every day | ORAL | 0 refills | Status: DC

## 2016-11-25 ENCOUNTER — Other Ambulatory Visit: Payer: Self-pay | Admitting: *Deleted

## 2016-11-25 MED ORDER — ABIRATERONE ACETATE 250 MG PO TABS: 1000.0000 mg | ORAL_TABLET | Freq: Every day | ORAL | 0 refills | Status: DC

## 2016-11-27 ENCOUNTER — Other Ambulatory Visit: Payer: Self-pay | Admitting: Internal Medicine

## 2016-11-27 DIAGNOSIS — Z0000 Encounter for general adult medical examination without abnormal findings: Secondary | ICD-10-CM

## 2016-12-11 DIAGNOSIS — C61 Malignant neoplasm of prostate: Secondary | ICD-10-CM | POA: Diagnosis not present

## 2016-12-16 DIAGNOSIS — C61 Malignant neoplasm of prostate: Secondary | ICD-10-CM | POA: Diagnosis not present

## 2016-12-16 DIAGNOSIS — N5201 Erectile dysfunction due to arterial insufficiency: Secondary | ICD-10-CM | POA: Diagnosis not present

## 2016-12-16 DIAGNOSIS — N393 Stress incontinence (female) (male): Secondary | ICD-10-CM | POA: Diagnosis not present

## 2016-12-17 DIAGNOSIS — Z5111 Encounter for antineoplastic chemotherapy: Secondary | ICD-10-CM | POA: Diagnosis not present

## 2016-12-17 DIAGNOSIS — C61 Malignant neoplasm of prostate: Secondary | ICD-10-CM | POA: Diagnosis not present

## 2017-01-06 ENCOUNTER — Encounter: Payer: Self-pay | Admitting: Internal Medicine

## 2017-01-07 DIAGNOSIS — M170 Bilateral primary osteoarthritis of knee: Secondary | ICD-10-CM | POA: Diagnosis not present

## 2017-01-10 ENCOUNTER — Other Ambulatory Visit: Payer: Self-pay | Admitting: *Deleted

## 2017-01-10 MED ORDER — ABIRATERONE ACETATE 250 MG PO TABS: 1000.0000 mg | ORAL_TABLET | Freq: Every day | ORAL | 0 refills | Status: DC

## 2017-01-14 ENCOUNTER — Other Ambulatory Visit: Payer: Self-pay | Admitting: Oncology

## 2017-01-22 ENCOUNTER — Telehealth: Payer: Self-pay | Admitting: *Deleted

## 2017-01-22 NOTE — Telephone Encounter (Signed)
Left message on patient's cell phone instructing him to call [facility]. I received a faxed communication from them stating they had made several attemtpts to contact him regarding Zytiga delivery. Instructed him to call [phone].

## 2017-01-30 ENCOUNTER — Other Ambulatory Visit (HOSPITAL_BASED_OUTPATIENT_CLINIC_OR_DEPARTMENT_OTHER): Payer: BLUE CROSS/BLUE SHIELD

## 2017-01-30 ENCOUNTER — Ambulatory Visit (INDEPENDENT_AMBULATORY_CARE_PROVIDER_SITE_OTHER): Payer: BLUE CROSS/BLUE SHIELD | Admitting: Internal Medicine

## 2017-01-30 ENCOUNTER — Ambulatory Visit (HOSPITAL_BASED_OUTPATIENT_CLINIC_OR_DEPARTMENT_OTHER): Payer: BLUE CROSS/BLUE SHIELD | Admitting: Oncology

## 2017-01-30 ENCOUNTER — Encounter: Payer: Self-pay | Admitting: Internal Medicine

## 2017-01-30 VITALS — BP 154/84 | HR 56 | Temp 98.0°F | Resp 18 | Ht 72.0 in | Wt 242.3 lb

## 2017-01-30 VITALS — BP 128/68 | HR 56 | Temp 98.1°F | Resp 14 | Ht 72.0 in | Wt 241.4 lb

## 2017-01-30 DIAGNOSIS — R7989 Other specified abnormal findings of blood chemistry: Secondary | ICD-10-CM

## 2017-01-30 DIAGNOSIS — R739 Hyperglycemia, unspecified: Secondary | ICD-10-CM | POA: Diagnosis not present

## 2017-01-30 DIAGNOSIS — E785 Hyperlipidemia, unspecified: Secondary | ICD-10-CM | POA: Diagnosis not present

## 2017-01-30 DIAGNOSIS — C61 Malignant neoplasm of prostate: Secondary | ICD-10-CM | POA: Diagnosis not present

## 2017-01-30 DIAGNOSIS — I10 Essential (primary) hypertension: Secondary | ICD-10-CM

## 2017-01-30 DIAGNOSIS — R946 Abnormal results of thyroid function studies: Secondary | ICD-10-CM

## 2017-01-30 LAB — BASIC METABOLIC PANEL
BUN: 14 mg/dL (ref 6–23)
CALCIUM: 9.7 mg/dL (ref 8.4–10.5)
CO2: 26 mEq/L (ref 19–32)
Chloride: 104 mEq/L (ref 96–112)
Creatinine, Ser: 0.7 mg/dL (ref 0.40–1.50)
GFR: 148.79 mL/min (ref >60.00)
GLUCOSE: 105 mg/dL — AB (ref 70–99)
POTASSIUM: 3.7 meq/L (ref 3.5–5.1)
SODIUM: 138 meq/L (ref 135–145)

## 2017-01-30 LAB — COMPREHENSIVE METABOLIC PANEL
ALK PHOS: 89 U/L (ref 40–150)
ALT: 23 U/L (ref 0–55)
ANION GAP: 10 meq/L (ref 3–11)
AST: 17 U/L (ref 5–34)
Albumin: 4.2 g/dL (ref 3.5–5.0)
BUN: 12.8 mg/dL (ref 7.0–26.0)
CALCIUM: 9.7 mg/dL (ref 8.4–10.4)
CO2: 28 mEq/L (ref 22–29)
CREATININE: 0.8 mg/dL (ref 0.7–1.3)
Chloride: 102 mEq/L (ref 98–109)
EGFR: >90 mL/min/{1.73_m2} (ref >90)
Glucose: 99 mg/dl (ref 70–140)
Potassium: 4.3 mEq/L (ref 3.5–5.1)
Sodium: 140 mEq/L (ref 136–145)
TOTAL PROTEIN: 7.8 g/dL (ref 6.4–8.3)
Total Bilirubin: 0.77 mg/dL (ref 0.20–1.20)

## 2017-01-30 LAB — TSH: TSH: 6.9 u[IU]/mL — ABNORMAL HIGH (ref 0.35–4.50)

## 2017-01-30 LAB — CBC WITH DIFFERENTIAL/PLATELET
BASO%: 1.3 % (ref 0.0–2.0)
BASOS ABS: 0.1 10*3/uL (ref 0.0–0.1)
EOS ABS: 0.2 10*3/uL (ref 0.0–0.5)
EOS%: 2.7 % (ref 0.0–7.0)
HCT: 43.4 % (ref 38.4–49.9)
HGB: 14.6 g/dL (ref 13.0–17.1)
LYMPH%: 42 % (ref 14.0–49.0)
MCH: 29 pg (ref 27.2–33.4)
MCHC: 33.6 g/dL (ref 32.0–36.0)
MCV: 86.3 fL (ref 79.3–98.0)
MONO#: 0.3 10*3/uL (ref 0.1–0.9)
MONO%: 5.8 % (ref 0.0–14.0)
NEUT#: 2.7 10*3/uL (ref 1.5–6.5)
NEUT%: 48.2 % (ref 39.0–75.0)
PLATELETS: 161 10*3/uL (ref 140–400)
RBC: 5.03 10*6/uL (ref 4.20–5.82)
RDW: 13.7 % (ref 11.0–14.6)
WBC: 5.7 10*3/uL (ref 4.0–10.3)
lymph#: 2.4 10*3/uL (ref 0.9–3.3)

## 2017-01-30 LAB — LIPID PANEL
CHOLESTEROL: 174 mg/dL (ref 0–200)
HDL: 46.6 mg/dL (ref >39.00)
LDL CALC: 96 mg/dL (ref 0–99)
NonHDL: 127.64
TRIGLYCERIDES: 156 mg/dL — AB (ref 0.0–149.0)
Total CHOL/HDL Ratio: 4
VLDL: 31.2 mg/dL (ref 0.0–40.0)

## 2017-01-30 MED ORDER — NEDOCROMIL SODIUM 2 % OP SOLN: 1.0000 [drp] | Freq: Every day | OPHTHALMIC | 6 refills | Status: DC

## 2017-01-30 NOTE — Progress Notes (Signed)
Subjective:    Patient ID: Gary [name], male    DOB: 1956-09-18, 61 y.o.   MRN: [id number]  DOS:  01/30/2017 Type of visit - description : Routine checkup Interval history: HTN, good med compliance, BP has been in the 140s when check. BP today is very good. c/o allergies: Nose congestion, throat itching, watery eyes. Taking Astelin and Zyrtec. Having knee problems, pain, swelling. Saw orthopedic surgery, slightly better with a local injection. Taking NSAIDs. Prostate cancer: On Lupron, having night sweats.   BP Readings from Last 3 Encounters:  01/30/17 (!) 154/84  01/30/17 128/68  10/11/16 (!) 166/73    Review of Systems  No fever chills. No weight loss No chest pain or difficulty breathing. No edema History of EtOH, drinking in moderation except for occasional weekend that he admits to overdrink.  Past Medical History:  Diagnosis Date  . Anxiety   . CAD (coronary artery disease) 10-2012   h/o stents   . Elevated PSA    Prostate Bx w/ Dr. Tresa [name]  . EtOH dependence ([place])   . Family history of adverse reaction to anesthesia    made father disoriented  . Gross hematuria   . Hepatic steatosis   . Hyperlipidemia LDL goal < 70   . Hypertension   . Metastatic adenocarcinoma to prostate ([place]) 2017   Gleason score 9, metastasis to R external Iliac node and large extraprostatic extension at prostatectomy  . Multiple sclerosis ([place]) 1991   Bilateral optic neuritis, treated and resolved but felt to be a manifestation of MS.  . Renal cyst, left    Bosniak 2K (indeterminant) hyperdense cyst CT 2014, Stable CT in 2016  . Ulcerative colitis [facility])     Past Surgical History:  Procedure Laterality Date  . FOOT SURGERY     R toe Fx (screws),  L dorsal FX (screws)  . LEFT HEART CATHETERIZATION WITH CORONARY ANGIOGRAM N/A 10/22/2012   Procedure: LEFT HEART CATHETERIZATION WITH CORONARY ANGIOGRAM;  Surgeon: Sinclair Grooms, MD;  Location: [facility] CATH LAB;  Service: Cardiovascular;   Laterality: N/A;  . LYMPHADENECTOMY Bilateral 11/24/2015   Procedure: BILATERAL PELVIC LYMPHADENECTOMY;  Surgeon: Alexis Frock, MD;  Location: [facility] ORS;  Service: Urology;  Laterality: Bilateral;  . PERCUTANEOUS CORONARY STENT INTERVENTION (PCI-S) N/A 10/23/2012   Procedure: PERCUTANEOUS CORONARY STENT INTERVENTION (PCI-S);  Surgeon: Sinclair Grooms, MD;  Location[facility] CATH LAB;  Service: Cardiovascular;  Laterality: N/A;  . PROSTATE BIOPSY  09/2015   Dr. Tresa [name]  . ROBOT ASSISTED LAPAROSCOPIC RADICAL PROSTATECTOMY N/A 11/24/2015   Procedure: XI ROBOTIC ASSISTED LAPAROSCOPIC RADICAL PROSTATECTOMY WITH INDOCYANINE GREEN DYE;  Surgeon: Alexis Frock, MD;  Location: [facility] ORS;  Service: Urology;  Laterality: N/A;  . VASECTOMY      Social History   Social History  . Marital status: Married    Spouse name: N/A  . Number of children: 1  . Years of education: N/A   Occupational History  . Managment  [employer]   Social History Main Topics  . Smoking status: Former Smoker    Types: Cigarettes    Quit date: 07/18/1990  . Smokeless tobacco: Former Systems developer     Comment: 1991  . Alcohol use Yes     Comment: h/o abuse, currently has slow done but still drinks sometimes   . Drug use: No  . Sexual activity: Yes   Other Topics Concern  . Not on file   Social History Narrative   Household-- pt and wife  Has an adult son      Allergies as of 01/30/2017   No Known Allergies     Medication List       Accurate as of 01/30/17  2:57 PM. Always use your most recent med list.          abiraterone Acetate 250 MG tablet Commonly known as:  ZYTIGA Take 4 tablets (1,000 mg total) by mouth daily. Take on an empty stomach 1 hour before or 2 hours after a meal   aspirin 81 MG tablet Take 81 mg by mouth daily.   atorvastatin 20 MG tablet Commonly known as:  LIPITOR Take 1 tablet (20 mg total) by mouth daily.   azelastine 0.1 % nasal spray Commonly known as:  ASTELIN Place 2 sprays into both  nostrils at bedtime as needed for rhinitis. Use in each nostril as directed   lisinopril-hydrochlorothiazide 10-12.5 MG tablet Commonly known as:  PRINZIDE,ZESTORETIC Take 1 tablet by mouth daily.   LUPRON DEPOT (32-MONTH) IM Inject into the muscle. Monthly per Urology   meloxicam 7.5 MG tablet Commonly known as:  MOBIC take 1 tablet by mouth once daily   nedocromil 2 % ophthalmic solution Commonly known as:  ALOCRIL Place 1 drop into both eyes daily.   nitroGLYCERIN 0.4 MG SL tablet Commonly known as:  NITROSTAT Place 1 tablet (0.4 mg total) under the tongue every 5 (five) minutes as needed for chest pain.   predniSONE 5 MG tablet Commonly known as:  DELTASONE take 1 tablet by mouth once daily WITH BREAKFAST   sertraline 25 MG tablet Commonly known as:  ZOLOFT Take 1 tablet (25 mg total) by mouth daily.          Objective:   Physical Exam BP 128/68 (BP Location: Left Arm, Patient Position: Sitting, Cuff Size: Normal)   Pulse (!) 56   Temp 98.1 F (36.7 C) (Oral)   Resp 14   Ht 6' (1.829 m)   Wt 241 lb 6 oz (109.5 kg)   SpO2 97%   BMI 32.74 kg/m  General:   Well developed, well nourished . NAD.  HEENT:  Normocephalic . Face symmetric, atraumatic Lungs:  CTA B Normal respiratory effort, no intercostal retractions, no accessory muscle use. Heart: RRR,  no murmur.  No pretibial edema bilaterally  Skin: Not pale. Not jaundice Neurologic:  alert & oriented X3.  Speech normal, gait appropriate for age and unassisted Psych--  Cognition and judgment appear intact.  Cooperative with normal attention span and concentration.  Behavior appropriate. No anxious or depressed appearing.      Assessment & Plan:   Assessment  Pre-diabetes HTN Hyperlipidemia Anxiety CAD s/p stents 2014  EtOH dependency Ulcerative colitis- last cscope 05-2015 Metastatic prostate cancer, DX 11-2015, radical prostatectomy 11-2015 --  continent as off  07-2016--on androgen depravation    Hematuria, saw urology 01-2013 reviewed: CT show a renal cyst, cystoscopy negative, they recommended follow-up MRI for the renal cyst. MS  Dx 1991 (B neuritis, rx, resolved, felt to be d/t MS)   PLAN Prediabetes: Last A1c satisfactory HTN: BP lately has been elevated but is very good today. Check a BMP, continue Zestoretic, monitor BPs at home. Hyperlipidemia: Last LDL 87, on Lipitor, history of CAD, will recheck, ideal LDL less than 70.  CAD: Asx Metastatic prostate cancer: On Lupron-Zytega, good response Knee pain: F/u @ ortho, on meloxicam and ibuprofen prn, knows not to take them the same day. Allergies: Continue Astelin, Zyrtec, add Flonase and alocril  TSH has been in the upper side of normal, recheck today RTC 6 months.

## 2017-01-30 NOTE — Patient Instructions (Signed)
GO TO THE LAB : Get the blood work     GO TO THE FRONT DESK Schedule your next appointment for a  physical exam in 6 months    Check the  blood pressure 2 or 3 times a   Week  Be sure your blood pressure is between 110/65 and  140/85. If it is consistently higher or lower, let me know  For allergies: Zyrtec daily Astelin nasal spray twice a day Start Flonase daily in the morning Start Alocril daily (eyedrops) Call if not improving

## 2017-01-30 NOTE — Assessment & Plan Note (Signed)
Prediabetes: Last A1c satisfactory HTN: BP lately has been elevated but is very good today. Check a BMP, continue Zestoretic, monitor BPs at home. Hyperlipidemia: Last LDL 87, on Lipitor, history of CAD, will recheck, ideal LDL less than 70.  CAD: Asx Metastatic prostate cancer: On Lupron-Zytega, good response Knee pain: F/u @ ortho, on meloxicam and ibuprofen prn, knows not to take them the same day. Allergies: Continue Astelin, Zyrtec, add Flonase and alocril  TSH has been in the upper side of normal, recheck today RTC 6 months.

## 2017-01-30 NOTE — Progress Notes (Addendum)
Hematology and Oncology Follow Up Visit  Gary [name] [id number] 12-20-1955 61 y.o. 01/30/2017 12:34 PM Gary [name], MDPaz, [name], MD   Principle Diagnosis:  61 year old gentleman diagnosed with prostate cancer in October 2016. His PSA was 60.98 and a Gleason score 4+5 = 9. He has biochemically recurrent advanced disease.   Prior Therapy:   He underwent robotic-assisted laparoscopic radical prostatectomy and bilateral pelvic lymphadenectomy on 11/24/2015. The final pathology revealed a prostate adenocarcinoma Gleason score 4+5 = 9 with extraprostatic extension is identified at the right anterior apex and mid, bilateral posterior from the apex and bladder neck area. Bilateral seminal vesicle infiltration was identified. Lymphovascular invasion is identified. Margins of resections were positive. One positive lymph node was identified out of a 9 examined. The final pathological staging was T3b N1.  His follow-up PSA remained elevated with a PSA on 03/07/2016 was 3.88.  Current therapy:  Androgen deprivation under the care of Dr. Tresa [name]. Zytiga 1000 mg daily with prednisone 5 mg daily started in July 2017.  Interim History: Gary [name] presents today for a follow-up visit. Since the last visit, he reports no changes in his health. He did develop knee pain and receive a steroid injections which helped his pain. He continues to take Zytiga with prednisone without any major complications. He denied any arthralgias, myalgias or fatigue. He denied any prednisone related complications such as hypoglycemia or weight gain.  He continues to be active and performs activities of daily living. He reports no hematuria, dysuria or any genitourinary complaints.   He does not report any headaches, blurry vision, syncope or seizures. He does not report any fevers or chills or sweats. He does not report any cough, wheezing or hemoptysis. Does not report any nausea, vomiting or abdominal pain. Does not report any  other satiety or change in his bowel habits. He does not report any frequency, urgency or hematuria. He does not report any dysuria. He does not report any skeletal complaints. Remaining review of systems unremarkable.   Medications: I have reviewed the patient's current medications.  Current Outpatient Prescriptions  Medication Sig Dispense Refill  . abiraterone Acetate (ZYTIGA) 250 MG tablet Take 4 tablets (1,000 mg total) by mouth daily. Take on an empty stomach 1 hour before or 2 hours after a meal 120 tablet 0  . aspirin 81 MG tablet Take 81 mg by mouth daily.    Marland Kitchen atorvastatin (LIPITOR) 20 MG tablet Take 1 tablet (20 mg total) by mouth daily. 90 tablet 1  . azelastine (ASTELIN) 0.1 % nasal spray Place 2 sprays into both nostrils at bedtime as needed for rhinitis. Use in each nostril as directed 30 mL 3  . Leuprolide Acetate (LUPRON DEPOT, 25-MONTH, IM) Inject into the muscle. Monthly per Urology    . lisinopril-hydrochlorothiazide (PRINZIDE,ZESTORETIC) 10-12.5 MG tablet Take 1 tablet by mouth daily. 90 tablet 1  . meloxicam (MOBIC) 7.5 MG tablet take 1 tablet by mouth once daily 30 tablet 0  . nedocromil (ALOCRIL) 2 % ophthalmic solution Place 1 drop into both eyes daily. 5 mL 6  . nitroGLYCERIN (NITROSTAT) 0.4 MG SL tablet Place 1 tablet (0.4 mg total) under the tongue every 5 (five) minutes as needed for chest pain. (Patient not taking: Reported on 01/30/2017) 25 tablet 3  . predniSONE (DELTASONE) 5 MG tablet take 1 tablet by mouth once daily WITH BREAKFAST 30 tablet 3  . sertraline (ZOLOFT) 25 MG tablet Take 1 tablet (25 mg total) by mouth daily. 90 tablet 1  No current facility-administered medications for this visit.      Allergies: No Known Allergies  Past Medical History, Surgical history, Social history, and Family History were reviewed and updated.   Physical Exam: Blood pressure (!) 154/84, pulse (!) 56, temperature 98 F (36.7 C), temperature source Oral, resp. rate 18,  height 6' (1.829 m), weight 242 lb 4.8 oz (109.9 kg), SpO2 100 %. ECOG: 0 General appearance: Alert, awake without distress  Head: Normocephalic, without obvious abnormality no oral ulcers and lesions.  Neck: no adenopathy Lymph nodes: Cervical, supraclavicular, and axillary nodes normal. Heart:regular rate and rhythm, S1, S2 normal, no murmur, click, rub or gallop Lung:chest clear, no wheezing, rales, normal symmetric air entry Abdomin: soft, non-tender, without masses or organomegaly no shifting dullness.  EXT:no erythema, induration, or nodules   Lab Results: Lab Results  Component Value Date   WBC 5.7 01/30/2017   HGB 14.6 01/30/2017   HCT 43.4 01/30/2017   MCV 86.3 01/30/2017   PLT 161 01/30/2017     Chemistry      Component Value Date/Time   NA 138 01/30/2017 0900   NA 140 10/11/2016 1249   K 3.7 01/30/2017 0900   K 4.4 10/11/2016 1249   CL 104 01/30/2017 0900   CO2 26 01/30/2017 0900   CO2 28 10/11/2016 1249   BUN 14 01/30/2017 0900   BUN 11.2 10/11/2016 1249   CREATININE 0.70 01/30/2017 0900   CREATININE 0.8 10/11/2016 1249      Component Value Date/Time   CALCIUM 9.7 01/30/2017 0900   CALCIUM 10.1 10/11/2016 1249   ALKPHOS 101 10/11/2016 1249   AST 24 10/11/2016 1249   ALT 29 10/11/2016 1249   BILITOT 0.75 10/11/2016 1249      Results for Gary [name] (MRN [id number]) as of 01/30/2017 12:34  Ref. Range 06/18/2016 14:31 08/07/2016 09:18 10/11/2016 12:49  PSA Latest Ref Range: 0.0 - 4.0 ng/mL <0.1 <0.1 <0.1     Impression and Plan:  61 year old gentleman with the following issues:  1. The prostate cancer diagnosed in October 2016. His PSA was 60.98 and a Gleason score of 4+5 = 9. He is status post radical prostatectomy on 11/24/2015 for locally advanced disease with his disease showed positive margins and extraprostatic extension. He had positive lymph node as well as persistent elevation in his PSA. His follow-up PSA in June 2017 showed elevated PSA of  3.88.  He is currently receiving androgen deprivation therapy in addition to [facility] without complications.   His PSA oh to have excellent quality of life.   Risks and benefits of continuing this medication were reviewed today and he is agreeable to continue at this time.   2. Androgen depravation: This is administered under the care of Dr. Tresa [name] which I recommended to continue indefinitely.  3. Hypertension: Her blood pressure is within normal range   4. Hypokalemia: His potassium has within normal range and replacement is not needed. 3.7 on 4/26.   5. LFT monitoring: He is will be repeated today and monitored periodically.   6. Follow-up: Will be in 4 months.      Zola Button, MD 4/26/201812:34 PM

## 2017-01-30 NOTE — Progress Notes (Signed)
Pre visit review using our clinic review tool, if applicable. No additional management support is needed unless otherwise documented below in the visit note. 

## 2017-01-31 ENCOUNTER — Telehealth: Payer: Self-pay | Admitting: *Deleted

## 2017-01-31 LAB — PSA

## 2017-01-31 NOTE — Telephone Encounter (Signed)
As noted below by Dr. Shadad, I informed patient of his PSA level. He verbalized understanding.  

## 2017-01-31 NOTE — Telephone Encounter (Signed)
-----   Message from Wyatt Portela, MD sent at 01/31/2017  8:41 AM EDT ----- Please let him know his PSA is still very low.

## 2017-02-03 MED ORDER — ATORVASTATIN CALCIUM 40 MG PO TABS: 40.0000 mg | ORAL_TABLET | Freq: Every day | ORAL | 2 refills | Status: DC

## 2017-02-03 NOTE — Addendum Note (Signed)
Addended byDamita Dunnings D on: 02/03/2017 01:08 PM   Modules accepted: Orders

## 2017-02-12 ENCOUNTER — Telehealth: Payer: Self-pay | Admitting: Oncology

## 2017-02-12 NOTE — Telephone Encounter (Signed)
Spoke with patient and confirmed the 8/23 appointment

## 2017-02-17 ENCOUNTER — Encounter: Payer: Self-pay | Admitting: *Deleted

## 2017-02-18 ENCOUNTER — Other Ambulatory Visit: Payer: Self-pay | Admitting: *Deleted

## 2017-02-18 MED ORDER — ABIRATERONE ACETATE 250 MG PO TABS: 1000.0000 mg | ORAL_TABLET | Freq: Every day | ORAL | 0 refills | Status: DC

## 2017-03-14 ENCOUNTER — Other Ambulatory Visit: Payer: Self-pay | Admitting: Oncology

## 2017-04-07 ENCOUNTER — Telehealth: Payer: Self-pay

## 2017-04-07 ENCOUNTER — Other Ambulatory Visit: Payer: Self-pay | Admitting: Oncology

## 2017-04-07 NOTE — Telephone Encounter (Signed)
-----   Message from Colon Branch, MD sent at 04/05/2017  8:10 PM EDT ----- Regarding: send a mychart message  Was prescribed Lipitor and TSH was abnormal. Needed additional labs. Please send the patient a message. FLP, AST, ALT. High cholesterol TSH, free T3, free T4 , dx increased TSH

## 2017-04-07 NOTE — Telephone Encounter (Signed)
My Chart message sent

## 2017-04-18 ENCOUNTER — Other Ambulatory Visit (INDEPENDENT_AMBULATORY_CARE_PROVIDER_SITE_OTHER): Payer: BLUE CROSS/BLUE SHIELD

## 2017-04-18 DIAGNOSIS — R946 Abnormal results of thyroid function studies: Secondary | ICD-10-CM

## 2017-04-18 DIAGNOSIS — E785 Hyperlipidemia, unspecified: Secondary | ICD-10-CM | POA: Diagnosis not present

## 2017-04-18 DIAGNOSIS — R7989 Other specified abnormal findings of blood chemistry: Secondary | ICD-10-CM

## 2017-04-18 DIAGNOSIS — E039 Hypothyroidism, unspecified: Secondary | ICD-10-CM

## 2017-04-18 LAB — LIPID PANEL
CHOL/HDL RATIO: 4
Cholesterol: 160 mg/dL (ref 0–200)
HDL: 40.2 mg/dL (ref >39.00)
LDL Cholesterol: 85 mg/dL (ref 0–99)
NONHDL: 119.48
TRIGLYCERIDES: 171 mg/dL — AB (ref 0.0–149.0)
VLDL: 34.2 mg/dL (ref 0.0–40.0)

## 2017-04-18 LAB — T4, FREE: FREE T4: 0.67 ng/dL (ref 0.60–1.60)

## 2017-04-18 LAB — TSH: TSH: 11.4 u[IU]/mL — ABNORMAL HIGH (ref 0.35–4.50)

## 2017-04-18 LAB — T3, FREE: T3, Free: 3.1 pg/mL (ref 2.3–4.2)

## 2017-04-18 LAB — AST: AST: 21 U/L (ref 0–37)

## 2017-04-18 LAB — ALT: ALT: 23 U/L (ref 0–53)

## 2017-04-21 MED ORDER — LEVOTHYROXINE SODIUM 50 MCG PO TABS: 50.0000 ug | ORAL_TABLET | Freq: Every day | ORAL | 1 refills | Status: DC

## 2017-05-02 ENCOUNTER — Other Ambulatory Visit: Payer: Self-pay | Admitting: Oncology

## 2017-05-12 ENCOUNTER — Other Ambulatory Visit: Payer: Self-pay | Admitting: Internal Medicine

## 2017-05-12 DIAGNOSIS — Z0000 Encounter for general adult medical examination without abnormal findings: Secondary | ICD-10-CM

## 2017-05-29 ENCOUNTER — Ambulatory Visit (HOSPITAL_BASED_OUTPATIENT_CLINIC_OR_DEPARTMENT_OTHER): Payer: BLUE CROSS/BLUE SHIELD | Admitting: Oncology

## 2017-05-29 ENCOUNTER — Other Ambulatory Visit (HOSPITAL_BASED_OUTPATIENT_CLINIC_OR_DEPARTMENT_OTHER): Payer: BLUE CROSS/BLUE SHIELD

## 2017-05-29 VITALS — BP 173/80 | HR 56 | Temp 98.9°F | Resp 17 | Ht 72.0 in | Wt 246.1 lb

## 2017-05-29 DIAGNOSIS — C61 Malignant neoplasm of prostate: Secondary | ICD-10-CM

## 2017-05-29 LAB — COMPREHENSIVE METABOLIC PANEL
ALBUMIN: 4.1 g/dL (ref 3.5–5.0)
ALK PHOS: 102 U/L (ref 40–150)
ALT: 27 U/L (ref 0–55)
AST: 19 U/L (ref 5–34)
Anion Gap: 9 mEq/L (ref 3–11)
BILIRUBIN TOTAL: 0.63 mg/dL (ref 0.20–1.20)
BUN: 12 mg/dL (ref 7.0–26.0)
CALCIUM: 9.7 mg/dL (ref 8.4–10.4)
CO2: 26 mEq/L (ref 22–29)
Chloride: 105 mEq/L (ref 98–109)
Creatinine: 0.8 mg/dL (ref 0.7–1.3)
GLUCOSE: 106 mg/dL (ref 70–140)
Potassium: 3.8 mEq/L (ref 3.5–5.1)
SODIUM: 140 meq/L (ref 136–145)
TOTAL PROTEIN: 8.1 g/dL (ref 6.4–8.3)

## 2017-05-29 LAB — CBC WITH DIFFERENTIAL/PLATELET
BASO%: 0.7 % (ref 0.0–2.0)
BASOS ABS: 0 10*3/uL (ref 0.0–0.1)
EOS ABS: 0.1 10*3/uL (ref 0.0–0.5)
EOS%: 1.5 % (ref 0.0–7.0)
HEMATOCRIT: 40.5 % (ref 38.4–49.9)
HEMOGLOBIN: 14 g/dL (ref 13.0–17.1)
LYMPH%: 29.8 % (ref 14.0–49.0)
MCH: 29.5 pg (ref 27.2–33.4)
MCHC: 34.6 g/dL (ref 32.0–36.0)
MCV: 85.3 fL (ref 79.3–98.0)
MONO#: 0.4 10*3/uL (ref 0.1–0.9)
MONO%: 7 % (ref 0.0–14.0)
NEUT%: 61 % (ref 39.0–75.0)
NEUTROS ABS: 3.3 10*3/uL (ref 1.5–6.5)
PLATELETS: 177 10*3/uL (ref 140–400)
RBC: 4.75 10*6/uL (ref 4.20–5.82)
RDW: 13 % (ref 11.0–14.6)
WBC: 5.4 10*3/uL (ref 4.0–10.3)
lymph#: 1.6 10*3/uL (ref 0.9–3.3)

## 2017-05-29 NOTE — Progress Notes (Signed)
Hematology and Oncology Follow Up Visit  Gary [name] [id number] 1956/03/18 61 y.o. 05/29/2017 1:40 PM Colon Branch, MDPaz, [name], MD   Principle Diagnosis:  61 year old gentleman diagnosed with prostate cancer in October 2016. His PSA was 60.98 and a Gleason score 4+5 = 9. He has biochemically recurrent advanced disease.   Prior Therapy:   He underwent robotic-assisted laparoscopic radical prostatectomy and bilateral pelvic lymphadenectomy on 11/24/2015. The final pathology revealed a prostate adenocarcinoma Gleason score 4+5 = 9 with extraprostatic extension is identified at the right anterior apex and mid, bilateral posterior from the apex and bladder neck area. Bilateral seminal vesicle infiltration was identified. Lymphovascular invasion is identified. Margins of resections were positive. One positive lymph node was identified out of a 9 examined. The final pathological staging was T3b N1.  His follow-up PSA remained elevated with a PSA on 03/07/2016 was 3.88.  Current therapy:  Androgen deprivation under the care of Dr. Tresa [name]. Zytiga 1000 mg daily with prednisone 5 mg daily started in July 2017.  Interim History: Gary [name] presents today for a follow-up visit. Since the last visit, he continues to do well without any major complaints. He does report periodic knee discomfort and leg swelling that resolves spontaneously. He continues to take Zytiga with prednisone without any major complications. He denied any arthralgias, myalgias or fatigue. He denied any prednisone related complications. He is eating well and have gained some weight.  He continues to be active and performs activities of daily living. He was started on Synthroid given his recent diagnosis of hypothyroidism.   He does not report any headaches, blurry vision, syncope or seizures. He does not report any fevers or chills or sweats. He does not report any cough, wheezing or hemoptysis. Does not report any nausea, vomiting  or abdominal pain. Does not report any other satiety or change in his bowel habits. He does not report any frequency, urgency or hematuria. He does not report any dysuria. He does not report any skeletal complaints. Remaining review of systems unremarkable.   Medications: I have reviewed the patient's current medications.  Current Outpatient Prescriptions  Medication Sig Dispense Refill  . aspirin 81 MG tablet Take 81 mg by mouth daily.    Marland Kitchen atorvastatin (LIPITOR) 20 MG tablet Take 1 tablet (20 mg total) by mouth daily. 90 tablet 1  . azelastine (ASTELIN) 0.1 % nasal spray Place 2 sprays into both nostrils at bedtime as needed for rhinitis. Use in each nostril as directed 30 mL 3  . Leuprolide Acetate (LUPRON DEPOT, 33-MONTH, IM) Inject into the muscle. Monthly per Urology    . levothyroxine (SYNTHROID, LEVOTHROID) 50 MCG tablet Take 1 tablet (50 mcg total) by mouth daily before breakfast. 30 tablet 1  . lisinopril-hydrochlorothiazide (PRINZIDE,ZESTORETIC) 10-12.5 MG tablet Take 1 tablet by mouth daily. 90 tablet 1  . meloxicam (MOBIC) 7.5 MG tablet take 1 tablet by mouth once daily 30 tablet 0  . nedocromil (ALOCRIL) 2 % ophthalmic solution Place 1 drop into both eyes daily. 5 mL 6  . nitroGLYCERIN (NITROSTAT) 0.4 MG SL tablet Place 1 tablet (0.4 mg total) under the tongue every 5 (five) minutes as needed for chest pain. (Patient not taking: Reported on 01/30/2017) 25 tablet 3  . predniSONE (DELTASONE) 5 MG tablet take 1 tablet by mouth once daily WITH BREAKFAST 30 tablet 3  . sertraline (ZOLOFT) 25 MG tablet Take 1 tablet (25 mg total) by mouth daily. 90 tablet 1  . ZYTIGA 250 MG tablet  TAKE 4 TABLETS (1,000MG) BY MOUTH DAILY ON AN EMPTY STOMACH 1 HOUR BEFORE OR 2 HOURS AFTER A MEAL 120 tablet 0   No current facility-administered medications for this visit.      Allergies: No Known Allergies  Past Medical History, Surgical history, Social history, and Family History were reviewed and  updated.   Physical Exam: Blood pressure (!) 173/80, pulse (!) 56, temperature 98.9 F (37.2 C), temperature source Oral, resp. rate 17, height 6' (1.829 m), weight 246 lb 1.6 oz (111.6 kg), SpO2 99 %. ECOG: 0 General appearance: Well-appearing gentleman without distress. Head: Normocephalic, without obvious abnormality no oral thrush or ulcers. Neck: no adenopathy Lymph nodes: Cervical, supraclavicular, and axillary nodes normal. Heart:regular rate and rhythm, S1, S2 normal, no murmur, click, rub or gallop Lung:chest clear, no wheezing, rales, normal symmetric air entry Abdomin: soft, non-tender, without masses or organomegaly no rebound or guarding. EXT:no erythema, induration, or nodules   Lab Results: Lab Results  Component Value Date   WBC 5.4 05/29/2017   HGB 14.0 05/29/2017   HCT 40.5 05/29/2017   MCV 85.3 05/29/2017   PLT 177 05/29/2017     Chemistry      Component Value Date/Time   NA 140 05/29/2017 1256   K 3.8 05/29/2017 1256   CL 104 01/30/2017 0900   CO2 26 05/29/2017 1256   BUN 12.0 05/29/2017 1256   CREATININE 0.8 05/29/2017 1256      Component Value Date/Time   CALCIUM 9.7 05/29/2017 1256   ALKPHOS 102 05/29/2017 1256   AST 19 05/29/2017 1256   ALT 27 05/29/2017 1256   BILITOT 0.63 05/29/2017 1256     Results for Gary [name], Gary [name] (MRN [id number]) as of 05/29/2017 13:20  Ref. Range 10/11/2016 12:49 01/30/2017 12:07  Prostate Specific Ag, Serum Latest Ref Range: 0.0 - 4.0 ng/mL <0.1 <0.1      Impression and Plan:  61 year old gentleman with the following issues:  1. The prostate cancer diagnosed in October 2016. His PSA was 60.98 and a Gleason score of 4+5 = 9. He is status post radical prostatectomy on 11/24/2015 for locally advanced disease with his disease showed positive margins and extraprostatic extension. He had positive lymph node as well as persistent elevation in his PSA. His follow-up PSA in June 2017 showed elevated PSA of 3.88.  He is  currently receiving androgen deprivation therapy in addition to [facility] without complications.   His PSA continues to show excellent response and remains undetectable. Risks and benefits of continuing Zytiga was reviewed again and he is agreeable to continue.  2. Androgen depravation: This is administered under the care of Dr. Tresa [name] which I recommended to continue indefinitely.  3. Hypertension: Her blood pressure remains manageable.  4. Hypokalemia: His potassium was normal in April 2018.  5. LFT monitoring: Liver function test is within normal range on 05/29/2017.  6. Follow-up: Will be in 3 months.      Zola Button, MD 8/23/20181:40 PM

## 2017-05-30 ENCOUNTER — Telehealth: Payer: Self-pay | Admitting: *Deleted

## 2017-05-30 LAB — PSA

## 2017-05-30 NOTE — Telephone Encounter (Signed)
-----   Message from Wyatt Portela, MD sent at 05/30/2017  8:17 AM EDT ----- Please lit him know his PSA is still low.

## 2017-05-30 NOTE — Telephone Encounter (Signed)
As noted below by Dr. Alen Blew, I informed patient that his PSA level is still low. He verbalized understanding.

## 2017-06-16 ENCOUNTER — Other Ambulatory Visit: Payer: Self-pay | Admitting: Pharmacist

## 2017-06-16 DIAGNOSIS — C61 Malignant neoplasm of prostate: Secondary | ICD-10-CM

## 2017-06-16 MED ORDER — ABIRATERONE ACETATE 250 MG PO TABS: 1000.0000 mg | ORAL_TABLET | Freq: Every day | ORAL | 0 refills | Status: DC

## 2017-06-17 ENCOUNTER — Telehealth: Payer: Self-pay | Admitting: Internal Medicine

## 2017-06-17 ENCOUNTER — Telehealth: Payer: Self-pay | Admitting: Pharmacy Technician

## 2017-06-17 ENCOUNTER — Telehealth: Payer: Self-pay | Admitting: Pharmacist

## 2017-06-17 NOTE — Telephone Encounter (Signed)
Will call the patient  BP Readings from Last 3 Encounters:  05/29/17 (!) 173/80  01/30/17 (!) 154/84  01/30/17 128/68

## 2017-06-17 NOTE — Telephone Encounter (Signed)
Oral Oncology Patient Advocate Encounter  Received notification from [facility] that Prior Authorization for Zytiga required reauthorization.  This request was submitted and has been approved.    PA# [id number]  Effective dates: 06/17/2017 through 06/17/2018  Gary [name]. [name], [place] Patient [place] [phone] 06/17/2017 9:54 AM

## 2017-06-17 NOTE — Telephone Encounter (Signed)
Please check the patient, his BP has been elevated lately according to the pharmacist phone note. Please check on medication compliance and ambulatory BPs. Let me know if I can do something to help him.

## 2017-06-17 NOTE — Telephone Encounter (Signed)
LMOM informing of below and instructed to return call or to send MyChart message. I will also try to send MyChart message to Pt.

## 2017-06-17 NOTE — Telephone Encounter (Signed)
Oral Chemotherapy Pharmacist Encounter  Received call from patient in [facility] with questions about refill prescription status to [facility]. Per patient, the pharmacy has been contacting the office for 2 months to get refill prescription for patient's Zytiga.  Epic reviewed, last Zytiga Rx sent on 05/03/17.  Per MD note on 05/29/17, continue Zytiga.  05/29/17 labs reviewed, OK for continued treatment Current medication list in Epic reviewed, no DDIs with Zytiga identified  Elevated BP of 173/80 noted at 8/23 visit. MD will be notified and BP will continue to be followed. Patient noted to have f/u with PCP in Nov 2018.  One month supply prescription for Zytiga e-scribed to [facility] in [place], [place] on 06/16/17.  I LVM for patient with info that new Rx was sent and that I was not able to locate a reason why a new Rx had not been sent since 05/03/17. I left phone number to Oral Oncology Clinic for patient if he has further questions.  Oral Oncology Clinic will continue to follow.  Thank you,  Johny Drilling, PharmD, BCPS, BCOP 06/17/2017  8:38 AM Oral Oncology Clinic [phone]

## 2017-06-19 ENCOUNTER — Telehealth: Payer: Self-pay | Admitting: Internal Medicine

## 2017-06-19 ENCOUNTER — Other Ambulatory Visit: Payer: Self-pay | Admitting: Oncology

## 2017-06-19 ENCOUNTER — Telehealth: Payer: Self-pay

## 2017-06-19 MED ORDER — LEVOTHYROXINE SODIUM 50 MCG PO TABS: 50.0000 ug | ORAL_TABLET | Freq: Every day | ORAL | 0 refills | Status: DC

## 2017-06-19 NOTE — Telephone Encounter (Signed)
Patient informed, and states he will call and schedule lab appointment first thing in the morning.

## 2017-06-19 NOTE — Telephone Encounter (Signed)
Pt stated he had just called pharmacy and they had not received rx for prednisone. Verified pharmacy and called in rx for prednisone. It had been escribed this AM and this was mentioned in voice mail on physician line.

## 2017-06-19 NOTE — Telephone Encounter (Addendum)
Relation to WP:VXYI Call back number:[phone] Pharmacy: [facility], [place]  Reason for call:  Patient states pharmacy never received levothyroxine (SYNTHROID, LEVOTHROID) 50 MCG tablet requesting office phone in, please advise

## 2017-06-19 NOTE — Addendum Note (Signed)
Addended byDamita Dunnings D on: 06/19/2017 04:36 PM   Modules accepted: Orders

## 2017-06-19 NOTE — Telephone Encounter (Signed)
30 day supply re-sent. Pt overdue for TSH recheck. Please schedule lab appt at Pt's convenience. No further refills w/o labs.

## 2017-06-23 ENCOUNTER — Telehealth: Payer: Self-pay | Admitting: Internal Medicine

## 2017-06-23 NOTE — Telephone Encounter (Signed)
Pt scheduled 06/24/2017 to discuss BP issues.

## 2017-06-23 NOTE — Telephone Encounter (Signed)
Relation to HD:IXBO Call back [phone]   Reason for call:  Patient scheduled follow up for 06/24/17 with PCP to discuss BP concerns, patient stated he would like he's vaccinations done at the time mainly: flu & tetanus.

## 2017-06-23 NOTE — Telephone Encounter (Signed)
Noted  

## 2017-06-24 ENCOUNTER — Encounter: Payer: Self-pay | Admitting: Internal Medicine

## 2017-06-24 ENCOUNTER — Ambulatory Visit (INDEPENDENT_AMBULATORY_CARE_PROVIDER_SITE_OTHER): Payer: BLUE CROSS/BLUE SHIELD | Admitting: Internal Medicine

## 2017-06-24 VITALS — BP 144/78 | HR 66 | Temp 98.1°F | Resp 14 | Ht 72.0 in | Wt 247.2 lb

## 2017-06-24 DIAGNOSIS — F419 Anxiety disorder, unspecified: Secondary | ICD-10-CM

## 2017-06-24 DIAGNOSIS — F32A Depression, unspecified: Secondary | ICD-10-CM

## 2017-06-24 DIAGNOSIS — E039 Hypothyroidism, unspecified: Secondary | ICD-10-CM | POA: Diagnosis not present

## 2017-06-24 DIAGNOSIS — Z23 Encounter for immunization: Secondary | ICD-10-CM | POA: Diagnosis not present

## 2017-06-24 DIAGNOSIS — I10 Essential (primary) hypertension: Secondary | ICD-10-CM

## 2017-06-24 DIAGNOSIS — R5383 Other fatigue: Secondary | ICD-10-CM | POA: Diagnosis not present

## 2017-06-24 DIAGNOSIS — F329 Major depressive disorder, single episode, unspecified: Secondary | ICD-10-CM

## 2017-06-24 MED ORDER — LOSARTAN POTASSIUM-HCTZ 50-12.5 MG PO TABS: 1.0000 | ORAL_TABLET | Freq: Every day | ORAL | 1 refills | Status: DC

## 2017-06-24 MED ORDER — LEVOTHYROXINE SODIUM 50 MCG PO TABS: 50.0000 ug | ORAL_TABLET | Freq: Every day | ORAL | 0 refills | Status: DC

## 2017-06-24 NOTE — Patient Instructions (Addendum)
   GO TO THE FRONT DESK Schedule labs to be done 2-3 weeks from today  Schedule your next appointment for a checkup in 3 months    Stop lisinopril HCT Start LOSARTAN 50-12.Marland Kitchen5 1 tablet daily  Check the  blood pressure 2 or 3 times a  week   Be sure your blood pressure is between 110/65 and  145/85. If it is consistently higher or lower, let me know

## 2017-06-24 NOTE — Progress Notes (Signed)
Pre visit review using our clinic review tool, if applicable. No additional management support is needed unless otherwise documented below in the visit note. 

## 2017-06-24 NOTE — Progress Notes (Signed)
Subjective:    Patient ID: Gary [name], male    DOB: 12-05-1955, 61 y.o.   MRN: [id number]  DOS:  06/24/2017 Type of visit - description : rov Interval history: HTN: Not well controlled, good compliance w/ medication. His main concern today is that he is not feeling well. He is very tired for the last few months: has knee swelling on and off , taking medication for prostate cancer that create s/e such as  hot flashes. Has severe ED since  prostate surgery. Admits to being anxious and depressed about his overall situation.    BP Readings from Last 3 Encounters:  06/24/17 (!) 144/78  05/29/17 (!) 173/80  01/30/17 (!) 154/84     Review of Systems  No suicidal ideas Denies neck pain Still drinks, mostly on the weekends. Occasional marijuana No tobacco products.  Past Medical History:  Diagnosis Date  . Anxiety   . CAD (coronary artery disease) 10-2012   h/o stents   . Elevated PSA    Prostate Bx w/ Dr. Tresa [name]  . EtOH dependence ([place])   . Family history of adverse reaction to anesthesia    made father disoriented  . Gross hematuria   . Hepatic steatosis   . Hyperlipidemia LDL goal < 70   . Hypertension   . Metastatic adenocarcinoma to prostate ([place]) 2017   Gleason score 9, metastasis to R external Iliac node and large extraprostatic extension at prostatectomy  . Multiple sclerosis ([place]) 1991   Bilateral optic neuritis, treated and resolved but felt to be a manifestation of MS.  . Renal cyst, left    Bosniak 2K (indeterminant) hyperdense cyst CT 2014, Stable CT in 2016  . Ulcerative colitis [facility])     Past Surgical History:  Procedure Laterality Date  . FOOT SURGERY     R toe Fx (screws),  L dorsal FX (screws)  . LEFT HEART CATHETERIZATION WITH CORONARY ANGIOGRAM N/A 10/22/2012   Procedure: LEFT HEART CATHETERIZATION WITH CORONARY ANGIOGRAM;  Surgeon: Sinclair Grooms, MD;  Location[facility] C[facility] LAB;  Service: Cardiovascular;  Laterality: N/A;  . LYMPHADENECTOMY  Bilateral 11/24/2015   Procedure: BILATERAL PELVIC LYMPHADENECTOMY;  Surgeon: Alexis Frock, MD;  Location: [facility] ORS;  Service: Urology;  Laterality: Bilateral;  . PERCUTANEOUS CORONARY STENT INTERVENTION (PCI-S) N/A 10/23/2012   Procedure: PERCUTANEOUS CORONARY STENT INTERVENTION (PCI-S);  Surgeon: Sinclair Grooms, MD;  Location: [facility] CATH LAB;  Service: Cardiovascular;  Laterality: N/A;  . PROSTATE BIOPSY  09/2015   Dr. Tresa [name]  . ROBOT ASSISTED LAPAROSCOPIC RADICAL PROSTATECTOMY N/A 11/24/2015   Procedure: XI ROBOTIC ASSISTED LAPAROSCOPIC RADICAL PROSTATECTOMY WITH INDOCYANINE GREEN DYE;  Surgeon: Alexis Frock, MD;  Location: [facility] ORS;  Service: Urology;  Laterality: N/A;  . VASECTOMY      Social History   Social History  . Marital status: Married    Spouse name: N/A  . Number of children: 1  . Years of education: N/A   Occupational History  . Managment  [employer]   Social History Main Topics  . Smoking status: Former Smoker    Types: Cigarettes    Quit date: 07/18/1990  . Smokeless tobacco: Former Systems developer     Comment: 1991  . Alcohol use Yes     Comment: h/o abuse, currently has slow done but still drinks sometimes   . Drug use: No  . Sexual activity: Yes   Other Topics Concern  . Not on file   Social History Narrative   Household--  pt and wife   Has an adult son      Allergies as of 06/24/2017   No Known Allergies     Medication List       Accurate as of 06/24/17 11:59 PM. Always use your most recent med list.          abiraterone Acetate 250 MG tablet Commonly known as:  ZYTIGA Take 4 tablets (1,000 mg total) by mouth daily. Take on an empty stomach 1 hour before or 2 hours after a meal   aspirin 81 MG tablet Take 81 mg by mouth daily.   atorvastatin 20 MG tablet Commonly known as:  LIPITOR Take 1 tablet (20 mg total) by mouth daily.   azelastine 0.1 % nasal spray Commonly known as:  ASTELIN Place 2 sprays into both nostrils at bedtime as needed for  rhinitis. Use in each nostril as directed   levothyroxine 50 MCG tablet Commonly known as:  SYNTHROID, LEVOTHROID Take 1 tablet (50 mcg total) by mouth daily before breakfast.   losartan-hydrochlorothiazide 50-12.5 MG tablet Commonly known as:  HYZAAR Take 1 tablet by mouth daily.   LUPRON DEPOT (99-MONTH) IM Inject into the muscle. Monthly per Urology   meloxicam 7.5 MG tablet Commonly known as:  MOBIC take 1 tablet by mouth once daily   nedocromil 2 % ophthalmic solution Commonly known as:  ALOCRIL Place 1 drop into both eyes daily.   nitroGLYCERIN 0.4 MG SL tablet Commonly known as:  NITROSTAT Place 1 tablet (0.4 mg total) under the tongue every 5 (five) minutes as needed for chest pain.   predniSONE 5 MG tablet Commonly known as:  DELTASONE take 1 tablet by mouth once daily WITH BREAKFAST.   sertraline 25 MG tablet Commonly known as:  ZOLOFT Take 1 tablet (25 mg total) by mouth daily.            Discharge Care Instructions        Start     Ordered   06/24/17 0000  Flu Vaccine QUAD 6+ mos PF IM (Fluarix Quad PF)     06/24/17 1440   06/24/17 0000  losartan-hydrochlorothiazide (HYZAAR) 50-12.5 MG tablet  Daily     06/24/17 1442   06/24/17 0000  Hemoglobin A1c     06/24/17 1444   06/24/17 7619  Basic metabolic panel     50/93/26 1444   06/24/17 0000  TSH     06/24/17 1444   06/24/17 0000  levothyroxine (SYNTHROID, LEVOTHROID) 50 MCG tablet  Daily before breakfast     06/24/17 1447         Objective:   Physical Exam BP (!) 144/78 (BP Location: Left Arm, Patient Position: Sitting, Cuff Size: Normal)   Pulse 66   Temp 98.1 F (36.7 C) (Oral)   Resp 14   Ht 6' (1.829 m)   Wt 247 lb 4 oz (112.2 kg)   SpO2 98%   BMI 33.53 kg/m  General:   Well developed, well nourished . NAD.  HEENT:  Normocephalic . Face symmetric, atraumatic Lungs:  CTA B Normal respiratory effort, no intercostal retractions, no accessory muscle use. Heart: RRR,  no murmur.    No pretibial edema bilaterally  Skin: Not pale. Not jaundice MSK: Hands with significant thenar atrophy bilaterally, symmetric. Neurologic:  alert & oriented X3.  Speech normal, gait appropriate for age and unassisted Symmetric, severe atrophy of the thenar aspect of the hands (adductor and flexor pollicis) Psych--  Cognition and judgment appear intact.  Cooperative with normal attention span and concentration.  Behavior appropriate. Somewhat anxious but not depressed appearing .      Assessment & Plan:   Assessment  Pre-diabetes HTN Hyperlipidemia Anxiety CAD s/p stents 2014  EtOH dependency Ulcerative colitis- last cscope 05-2015 Metastatic prostate cancer, DX 11-2015, radical prostatectomy 11-2015 --  continent as off  07-2016--on androgen depravation  Hematuria, saw urology 01-2013 reviewed: CT show a renal cyst, cystoscopy negative, they recommended follow-up MRI for the renal cyst. 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 BMP in 3 weeks Fatigue: Likely multifactorial including use of Lupron and anxiety depression. Check A1c Hypothyroidism:started Synthroid recently, will  check labs in 3 weeks thus continue the same medication for now Anxiety depression: triggered by his h/o prostate cancer and subsequent surgery as well as cancer treatment; fortunately he has a very supportive wife and his child is doing great. Counseled but declined see a counselor or increase dose of seretraline No suicidal ideas, PHQ -9 is 16  Prostate ca: Saw oncology 05/29/2017 felt to be responding well to treatment Threnar atrophy: Bilateral, symmetric, apparently chronic, denies neck pain. Consider further assessment by neurology. RTC 3 weeks for labs: CMP, TSH, A1c RTC 3 months  F2F > 30

## 2017-06-25 DIAGNOSIS — F329 Major depressive disorder, single episode, unspecified: Secondary | ICD-10-CM | POA: Insufficient documentation

## 2017-06-25 DIAGNOSIS — F32A Depression, unspecified: Secondary | ICD-10-CM | POA: Insufficient documentation

## 2017-06-25 DIAGNOSIS — F419 Anxiety disorder, unspecified: Secondary | ICD-10-CM | POA: Insufficient documentation

## 2017-06-25 NOTE — Assessment & Plan Note (Signed)
HTN: Needs better control, stop lisinopril HCT, start losartan HCT at a slightly higher dose. BMP in 3 weeks Fatigue: Likely multifactorial including use of Lupron and anxiety depression. Check A1c Hypothyroidism:started Synthroid recently, will  check labs in 3 weeks thus continue the same medication for now Anxiety depression: triggered by his h/o prostate cancer and subsequent surgery as well as cancer treatment; fortunately he has a very supportive wife and his child is doing great. Counseled but declined see a counselor or increase dose of seretraline No suicidal ideas, PHQ -9 is 16  Prostate ca: Saw oncology 05/29/2017 felt to be responding well to treatment Threnar atrophy: Bilateral, symmetric, apparently chronic, denies neck pain. Consider further assessment by neurology. RTC 3 weeks for labs: CMP, TSH, A1c RTC 3 months

## 2017-07-01 ENCOUNTER — Encounter: Payer: Self-pay | Admitting: Internal Medicine

## 2017-07-03 DIAGNOSIS — N5201 Erectile dysfunction due to arterial insufficiency: Secondary | ICD-10-CM | POA: Diagnosis not present

## 2017-07-03 DIAGNOSIS — C61 Malignant neoplasm of prostate: Secondary | ICD-10-CM | POA: Diagnosis not present

## 2017-07-03 LAB — PSA: PSA: <0.015

## 2017-07-04 ENCOUNTER — Encounter: Payer: Self-pay | Admitting: Internal Medicine

## 2017-07-04 MED ORDER — CARVEDILOL 12.5 MG PO TABS: 12.5000 mg | ORAL_TABLET | Freq: Two times a day (BID) | ORAL | 3 refills | Status: DC

## 2017-07-04 NOTE — Telephone Encounter (Signed)
See message  Received: Today  Message Contents  Colon Branch, MD  Damita Dunnings, CMA        Send carvedilol 12.5 one tablet twice a day #60 and 3 refills

## 2017-07-04 NOTE — Telephone Encounter (Signed)
Rx sent to [facility].

## 2017-07-10 ENCOUNTER — Encounter: Payer: Self-pay | Admitting: Internal Medicine

## 2017-07-10 ENCOUNTER — Ambulatory Visit (INDEPENDENT_AMBULATORY_CARE_PROVIDER_SITE_OTHER): Payer: BLUE CROSS/BLUE SHIELD | Admitting: Internal Medicine

## 2017-07-10 VITALS — BP 152/88 | HR 60 | Temp 97.8°F | Resp 14 | Ht 72.0 in | Wt 244.5 lb

## 2017-07-10 DIAGNOSIS — N393 Stress incontinence (female) (male): Secondary | ICD-10-CM | POA: Diagnosis not present

## 2017-07-10 DIAGNOSIS — I10 Essential (primary) hypertension: Secondary | ICD-10-CM

## 2017-07-10 DIAGNOSIS — N5201 Erectile dysfunction due to arterial insufficiency: Secondary | ICD-10-CM | POA: Diagnosis not present

## 2017-07-10 DIAGNOSIS — C775 Secondary and unspecified malignant neoplasm of intrapelvic lymph nodes: Secondary | ICD-10-CM | POA: Diagnosis not present

## 2017-07-10 DIAGNOSIS — C61 Malignant neoplasm of prostate: Secondary | ICD-10-CM | POA: Diagnosis not present

## 2017-07-10 LAB — BASIC METABOLIC PANEL
BUN: 13 mg/dL (ref 6–23)
CHLORIDE: 103 meq/L (ref 96–112)
CO2: 30 meq/L (ref 19–32)
CREATININE: 0.74 mg/dL (ref 0.40–1.50)
Calcium: 9.6 mg/dL (ref 8.4–10.5)
GFR: 139.33 mL/min (ref >60.00)
GLUCOSE: 115 mg/dL — AB (ref 70–99)
Potassium: 4.1 mEq/L (ref 3.5–5.1)
Sodium: 140 mEq/L (ref 135–145)

## 2017-07-10 MED ORDER — LISINOPRIL-HYDROCHLOROTHIAZIDE 20-12.5 MG PO TABS: 2.0000 | ORAL_TABLET | Freq: Every day | ORAL | 1 refills | Status: DC

## 2017-07-10 NOTE — Progress Notes (Signed)
Subjective:    Patient ID: Gary [name], male    DOB: 19-Mar-1956, 61 y.o.   MRN: [id number]  DOS:  07/10/2017 Type of visit - description : Acute, BP management Interval history: On 06/24/17 BP was 144/78, lisinopril HCT 10/12.5 mg  1 qd was changed to losartan 50/12.5 1 po qd  On 07/01/17 BP was elevated so losartan HCT  was increased to 2 tablets daily On 07/04/2017, BP was still elevated and I added carvedilol. Here for follow-up BPs still in the high side, this week at home was 164/88, 162/104 Denies any recent increase in salt intake Although meloxicam is  In his med list, he is not taking it  Review of Systems In general he feels well, no new symptoms or problems. Specifically denies chest pain, difficulty breathing, edema or palpitations.  Past Medical History:  Diagnosis Date  . Anxiety   . CAD (coronary artery disease) 10-2012   h/o stents   . Elevated PSA    Prostate Bx w/ Dr. Tresa [name]  . EtOH dependence ([place])   . Family history of adverse reaction to anesthesia    made father disoriented  . Gross hematuria   . Hepatic steatosis   . Hyperlipidemia LDL goal < 70   . Hypertension   . Metastatic adenocarcinoma to prostate ([place]) 2017   Gleason score 9, metastasis to R external Iliac node and large extraprostatic extension at prostatectomy  . Multiple sclerosis ([place]) 1991   Bilateral optic neuritis, treated and resolved but felt to be a manifestation of MS.  . Renal cyst, left    Bosniak 2K (indeterminant) hyperdense cyst CT 2014, Stable CT in 2016  . Ulcerative colitis [facility])     Past Surgical History:  Procedure Laterality Date  . FOOT SURGERY     R toe Fx (screws),  L dorsal FX (screws)  . LEFT HEART CATHETERIZATION WITH CORONARY ANGIOGRAM N/A 10/22/2012   Procedure: LEFT HEART CATHETERIZATION WITH CORONARY ANGIOGRAM;  Surgeon: Sinclair Grooms, MD;  Location: [facility] CATH LAB;  Service: Cardiovascular;  Laterality: N/A;  . LYMPHADENECTOMY Bilateral 11/24/2015   Procedure: BILATERAL PELVIC LYMPHADENECTOMY;  Surgeon: Alexis Frock, MD;  Location: [facility] ORS;  Service: Urology;  Laterality: Bilateral;  . PERCUTANEOUS CORONARY STENT INTERVENTION (PCI-S) N/A 10/23/2012   Procedure: PERCUTANEOUS CORONARY STENT INTERVENTION (PCI-S);  Surgeon: Sinclair Grooms, MD;  Location: [facility] CATH LAB;  Service: Cardiovascular;  Laterality: N/A;  . PROSTATE BIOPSY  09/2015   Dr. Tresa [name]  . ROBOT ASSISTED LAPAROSCOPIC RADICAL PROSTATECTOMY N/A 11/24/2015   Procedure: XI ROBOTIC ASSISTED LAPAROSCOPIC RADICAL PROSTATECTOMY WITH INDOCYANINE GREEN DYE;  Surgeon: Alexis Frock, MD;  Location: [facility] ORS;  Service: Urology;  Laterality: N/A;  . VASECTOMY      Social History   Social History  . Marital status: Married    Spouse name: N/A  . Number of children: 1  . Years of education: N/A   Occupational History  . Managment  [employer]   Social History Main Topics  . Smoking status: Former Smoker    Types: Cigarettes    Quit date: 07/18/1990  . Smokeless tobacco: Former Systems developer     Comment: 1991  . Alcohol use Yes     Comment: h/o abuse, currently has slow done but still drinks sometimes   . Drug use: No  . Sexual activity: Yes   Other Topics Concern  . Not on file   Social History Narrative   Household-- pt and wife  Has an adult son      Allergies as of 07/10/2017   No Known Allergies     Medication List       Accurate as of 07/10/17 11:59 PM. Always use your most recent med list.          abiraterone Acetate 250 MG tablet Commonly known as:  ZYTIGA Take 4 tablets (1,000 mg total) by mouth daily. Take on an empty stomach 1 hour before or 2 hours after a meal   aspirin 81 MG tablet Take 81 mg by mouth daily.   atorvastatin 20 MG tablet Commonly known as:  LIPITOR Take 1 tablet (20 mg total) by mouth daily.   azelastine 0.1 % nasal spray Commonly known as:  ASTELIN Place 2 sprays into both nostrils at bedtime as needed for rhinitis. Use in each nostril  as directed   carvedilol 12.5 MG tablet Commonly known as:  COREG Take 1 tablet (12.5 mg total) by mouth 2 (two) times daily with a meal.   levothyroxine 50 MCG tablet Commonly known as:  SYNTHROID, LEVOTHROID Take 1 tablet (50 mcg total) by mouth daily before breakfast.   lisinopril-hydrochlorothiazide 20-12.5 MG tablet Commonly known as:  PRINZIDE,ZESTORETIC Take 2 tablets by mouth daily.   LUPRON DEPOT (62-MONTH) IM Inject into the muscle. Monthly per Urology   meloxicam 7.5 MG tablet Commonly known as:  MOBIC take 1 tablet by mouth once daily   nitroGLYCERIN 0.4 MG SL tablet Commonly known as:  NITROSTAT Place 1 tablet (0.4 mg total) under the tongue every 5 (five) minutes as needed for chest pain.   predniSONE 5 MG tablet Commonly known as:  DELTASONE take 1 tablet by mouth once daily WITH BREAKFAST.   sertraline 25 MG tablet Commonly known as:  ZOLOFT Take 1 tablet (25 mg total) by mouth daily.          Objective:   Physical Exam BP (!) 152/88 (BP Location: Left Arm, Patient Position: Sitting, Cuff Size: Normal)   Pulse 60   Temp 97.8 F (36.6 C) (Oral)   Resp 14   Ht 6' (1.829 m)   Wt 244 lb 8 oz (110.9 kg)   SpO2 98%   BMI 33.16 kg/m  General:   Well developed, well nourished . NAD.  HEENT:  Normocephalic . Face symmetric, atraumatic Lungs:  CTA B Normal respiratory effort, no intercostal retractions, no accessory muscle use. Heart: RRR,  no murmur.  No pretibial edema bilaterally  Skin: Not pale. Not jaundice Neurologic:  alert & oriented X3.  Speech normal, gait appropriate for age and unassisted Psych--  Cognition and judgment appear intact.  Cooperative with normal attention span and concentration.  Behavior appropriate. No anxious or depressed appearing.      Assessment & Plan:   Assessment  Pre-diabetes HTN Hyperlipidemia Anxiety CAD s/p stents 2014  EtOH dependency Ulcerative colitis- last cscope 05-2015 Metastatic prostate  cancer, DX 11-2015, radical prostatectomy 11-2015 --  continent as off  07-2016--on androgen depravation  Hematuria, saw urology 01-2013 reviewed: CT show a renal cyst, cystoscopy negative, they recommended follow-up MRI for the renal cyst. MS  Dx 1991 (B neuritis, rx, resolved, felt to be d/t MS) Bilateral thenar atrophy noted 06-2017 (apparently chronic)  PLAN HTN: Since the last office visit, medication has been adjusted (see HPI) but blood pressure is not well-controlled. Currently on Losartan-HCT 50-12.5, two tablets daily Carvedilol 12.5 one tablet twice a day. Pulse 60 BP is not well-controlled,  almost seems like lisinopril  was working for him better than losartan Plan: BMP today d/c losartan HCT, start lisinopril HCT  20/12.5, two tablets daily (higher dose than before), continue carvedilol, monitor BPs, BMP in 2 weeks. Call in one week, if not improving add amlodipine RTC already scheduled for November   Today, I spent more than 25  min with the patient: >50% of the time counseling regards elevated blood pressure, treatment options, benefits of going back to lisinopril which seems to work better for him. Also reviewing the chart.

## 2017-07-10 NOTE — Progress Notes (Signed)
Pre visit review using our clinic review tool, if applicable. No additional management support is needed unless otherwise documented below in the visit note. 

## 2017-07-10 NOTE — Patient Instructions (Addendum)
  GO TO THE FRONT DESK Schedule labs to be done 2 weeks from today  See you in November 2 for your physical   Check the  blood pressure   daily Be sure your blood pressure is between 110/65 and  1435/85. If it is consistently higher or lower, let me know  Stop losartan HCT   start lisinopril HCT 20/12.5:   2 tablets daily

## 2017-07-11 NOTE — Assessment & Plan Note (Signed)
HTN: Since the last office visit, medication has been adjusted (see HPI) but blood pressure is not well-controlled. Currently on Losartan-HCT 50-12.5, two tablets daily Carvedilol 12.5 one tablet twice a day. Pulse 60 BP is not well-controlled,  almost seems like lisinopril was working for him better than losartan Plan: BMP today d/c losartan HCT, start lisinopril HCT  20/12.5, two tablets daily (higher dose than before), continue carvedilol, monitor BPs, BMP in 2 weeks. Call in one week, if not improving add amlodipine RTC already scheduled for November

## 2017-07-17 ENCOUNTER — Other Ambulatory Visit: Payer: BLUE CROSS/BLUE SHIELD

## 2017-07-17 ENCOUNTER — Encounter: Payer: Self-pay | Admitting: Internal Medicine

## 2017-07-17 MED ORDER — AMLODIPINE BESYLATE 5 MG PO TABS: 5.0000 mg | ORAL_TABLET | Freq: Every day | ORAL | 6 refills | Status: DC

## 2017-07-17 NOTE — Telephone Encounter (Signed)
see message, send amlodipine 5 mg1 by mouth daily #30 and 6 refills  Received: Today  Message Contents  Colon Branch, MD  Damita Dunnings, [place]

## 2017-07-18 ENCOUNTER — Encounter: Payer: Self-pay | Admitting: Internal Medicine

## 2017-07-21 ENCOUNTER — Other Ambulatory Visit: Payer: Self-pay | Admitting: Pharmacist

## 2017-07-21 DIAGNOSIS — C61 Malignant neoplasm of prostate: Secondary | ICD-10-CM

## 2017-07-21 MED ORDER — ABIRATERONE ACETATE 250 MG PO TABS: 1000.0000 mg | ORAL_TABLET | Freq: Every day | ORAL | 0 refills | Status: DC

## 2017-07-22 ENCOUNTER — Encounter: Payer: Self-pay | Admitting: Oncology

## 2017-07-22 ENCOUNTER — Encounter: Payer: Self-pay | Admitting: Internal Medicine

## 2017-07-23 ENCOUNTER — Telehealth: Payer: Self-pay | Admitting: *Deleted

## 2017-07-23 NOTE — Telephone Encounter (Signed)
Replied to patient's e-mail, [id number] on oral chemotherapy drugs

## 2017-07-24 ENCOUNTER — Other Ambulatory Visit (INDEPENDENT_AMBULATORY_CARE_PROVIDER_SITE_OTHER): Payer: BLUE CROSS/BLUE SHIELD

## 2017-07-24 DIAGNOSIS — E039 Hypothyroidism, unspecified: Secondary | ICD-10-CM

## 2017-07-24 DIAGNOSIS — I10 Essential (primary) hypertension: Secondary | ICD-10-CM | POA: Diagnosis not present

## 2017-07-24 LAB — BASIC METABOLIC PANEL
BUN: 12 mg/dL (ref 6–23)
CALCIUM: 9.5 mg/dL (ref 8.4–10.5)
CO2: 29 mEq/L (ref 19–32)
Chloride: 104 mEq/L (ref 96–112)
Creatinine, Ser: 0.72 mg/dL (ref 0.40–1.50)
GFR: 143.79 mL/min (ref >60.00)
Glucose, Bld: 114 mg/dL — ABNORMAL HIGH (ref 70–99)
POTASSIUM: 3.5 meq/L (ref 3.5–5.1)
SODIUM: 142 meq/L (ref 135–145)

## 2017-07-24 LAB — HEMOGLOBIN A1C: HEMOGLOBIN A1C: 5.8 % (ref 4.6–6.5)

## 2017-07-24 LAB — TSH: TSH: 4.5 u[IU]/mL (ref 0.35–4.50)

## 2017-07-26 ENCOUNTER — Emergency Department (HOSPITAL_COMMUNITY)
Admission: EM | Admit: 2017-07-26 | Discharge: 2017-07-26 | Disposition: A | Discharge status: Home / Self Care | Payer: BLUE CROSS/BLUE SHIELD | Attending: Emergency Medicine | Admitting: Emergency Medicine

## 2017-07-26 ENCOUNTER — Encounter (HOSPITAL_COMMUNITY): Payer: Self-pay | Admitting: Emergency Medicine

## 2017-07-26 DIAGNOSIS — R040 Epistaxis: Secondary | ICD-10-CM

## 2017-07-26 DIAGNOSIS — Z79899 Other long term (current) drug therapy: Secondary | ICD-10-CM | POA: Diagnosis not present

## 2017-07-26 DIAGNOSIS — I10 Essential (primary) hypertension: Secondary | ICD-10-CM | POA: Insufficient documentation

## 2017-07-26 DIAGNOSIS — I2510 Atherosclerotic heart disease of native coronary artery without angina pectoris: Secondary | ICD-10-CM | POA: Diagnosis not present

## 2017-07-26 DIAGNOSIS — E039 Hypothyroidism, unspecified: Secondary | ICD-10-CM | POA: Diagnosis not present

## 2017-07-26 DIAGNOSIS — Z87891 Personal history of nicotine dependence: Secondary | ICD-10-CM | POA: Diagnosis not present

## 2017-07-26 LAB — CBC WITH DIFFERENTIAL/PLATELET
BASOS ABS: 0 10*3/uL (ref 0.0–0.1)
Basophils Relative: 1 %
EOS PCT: 3 %
Eosinophils Absolute: 0.2 10*3/uL (ref 0.0–0.7)
HCT: 37.2 % — ABNORMAL LOW (ref 39.0–52.0)
HEMOGLOBIN: 12.9 g/dL — AB (ref 13.0–17.0)
LYMPHS ABS: 2.3 10*3/uL (ref 0.7–4.0)
LYMPHS PCT: 40 %
MCH: 29.1 pg (ref 26.0–34.0)
MCHC: 34.7 g/dL (ref 30.0–36.0)
MCV: 83.8 fL (ref 78.0–100.0)
Monocytes Absolute: 0.4 10*3/uL (ref 0.1–1.0)
Monocytes Relative: 7 %
NEUTROS ABS: 2.8 10*3/uL (ref 1.7–7.7)
NEUTROS PCT: 49 %
PLATELETS: 164 10*3/uL (ref 150–400)
RBC: 4.44 MIL/uL (ref 4.22–5.81)
RDW: 12.9 % (ref 11.5–15.5)
WBC: 5.7 10*3/uL (ref 4.0–10.5)

## 2017-07-26 LAB — BASIC METABOLIC PANEL
ANION GAP: 11 (ref 5–15)
BUN: 14 mg/dL (ref 6–20)
CHLORIDE: 102 mmol/L (ref 101–111)
CO2: 27 mmol/L (ref 22–32)
CREATININE: 0.66 mg/dL (ref 0.61–1.24)
Calcium: 9.1 mg/dL (ref 8.9–10.3)
GFR calc Af Amer: >60 mL/min (ref >60)
GFR calc non Af Amer: >60 mL/min (ref >60)
GLUCOSE: 113 mg/dL — AB (ref 65–99)
Potassium: 3.4 mmol/L — ABNORMAL LOW (ref 3.5–5.1)
Sodium: 140 mmol/L (ref 135–145)

## 2017-07-26 NOTE — Discharge Instructions (Signed)
Use over-the-counter saline nasal spray to help keep your nose moist. Followed up with an ENT doctor for further evaluation. If the bleeding restarts, and you are unable to stop the bleeding after pinching your  nose for approximately 15-20 minutes, return to the emergency room

## 2017-07-26 NOTE — ED Triage Notes (Addendum)
Patient c/o epistaxis onset of 7:40 this am. States he has a short episode on Monday. C/o sniffles for past couple days but took allergy pill this am when bleeding began. Denies blood thinners. Bleeding controlled.

## 2017-07-26 NOTE — ED Notes (Signed)
Patient continuing to not have any bleeding. Just reports small clots passing.

## 2017-07-26 NOTE — ED Provider Notes (Signed)
[facility] DEPT Provider Note   CSN: [id number] Arrival date & time: 07/26/17  0786     History   Chief Complaint Chief Complaint  Patient presents with  . Epistaxis    HPI Gary [name] is a 61 y.o. male.  HPI Patient presents to the emergency room for evaluation of a nosebleed that occurred earlier this morning. The patient has a history of several medical problems as indicated below.  Patient states she woke up this morning about 740. He noticed he was bleeding from both nares.  Patient was unable to stop the bleeding on his own so he came to the emergency room. When he arrived in the emergency room he removed the nasal packing that he placed at home and the bleeding had stopped. Patient had a mild nosebleed earlier this week. He's had some nasal congestion recently and has been taking allergy pills. He denies any prior history of nosebleeds. He denies any anticoagulants other than aspirin. No chest pain or shortness of breath, no bleeding or bruising elsewhere Past Medical History:  Diagnosis Date  . Anxiety   . CAD (coronary artery disease) 10-2012   h/o stents   . Elevated PSA    Prostate Bx w/ Dr. Tresa [name]  . EtOH dependence ([place])   . Family history of adverse reaction to anesthesia    made father disoriented  . Gross hematuria   . Hepatic steatosis   . Hyperlipidemia LDL goal < 70   . Hypertension   . Metastatic adenocarcinoma to prostate ([place]) 2017   Gleason score 9, metastasis to R external Iliac node and large extraprostatic extension at prostatectomy  . Multiple sclerosis ([place]) 1991   Bilateral optic neuritis, treated and resolved but felt to be a manifestation of MS.  . Renal cyst, left    Bosniak 2K (indeterminant) hyperdense cyst CT 2014, Stable CT in 2016  . Ulcerative colitis [facility])     Patient Active Problem List   Diagnosis Date Noted  . Anxiety and depression 06/25/2017  . Hypothyroidism 06/24/2017  . Prostate cancer  ([place]) 11/24/2015  . PCP NOTES >>> 07/12/2015  . Annual physical exam 07/08/2014  . Elevated LFTs 07/08/2014  . EtOH dependence ([place]) 02/15/2014  . Hyperglycemia 02/15/2014  . Ulcerative colitis ([place]) 10/22/2012    Class: Chronic  . Hypertension, essential 10/22/2012    Class: Chronic  . CAD in native artery 10/22/2012    Class: Acute  . Hyperlipidemia 10/22/2012  . H/O multiple sclerosis 10/22/2012    Past Surgical History:  Procedure Laterality Date  . FOOT SURGERY     R toe Fx (screws),  L dorsal FX (screws)  . LEFT HEART CATHETERIZATION WITH CORONARY ANGIOGRAM N/A 10/22/2012   Procedure: LEFT HEART CATHETERIZATION WITH CORONARY ANGIOGRAM;  Surgeon: Sinclair Grooms, MD;  Location: [facility] CATH LAB;  Service: Cardiovascular;  Laterality: N/A;  . LYMPHADENECTOMY Bilateral 11/24/2015   Procedure: BILATERAL PELVIC LYMPHADENECTOMY;  Surgeon: Alexis Frock, MD;  Location: [facility] ORS;  Service: Urology;  Laterality: Bilateral;  . PERCUTANEOUS CORONARY STENT INTERVENTION (PCI-S) N/A 10/23/2012   Procedure: PERCUTANEOUS CORONARY STENT INTERVENTION (PCI-S);  Surgeon: Sinclair Grooms, MD;  Location: [facility] CATH LAB;  Service: Cardiovascular;  Laterality: N/A;  . PROSTATE BIOPSY  09/2015   Dr. Tresa [name]  . ROBOT ASSISTED LAPAROSCOPIC RADICAL PROSTATECTOMY N/A 11/24/2015   Procedure: XI ROBOTIC ASSISTED LAPAROSCOPIC RADICAL PROSTATECTOMY WITH INDOCYANINE GREEN DYE;  Surgeon: Alexis Frock, MD;  Location: [facility] ORS;  Service: Urology;  Laterality: N/A;  . VASECTOMY         Home Medications    Prior to Admission medications   Medication Sig Start Date End Date Taking? Authorizing Provider  abiraterone Acetate (ZYTIGA) 250 MG tablet Take 4 tablets (1,000 mg total) by mouth daily. Take on an empty stomach 1 hour before or 2 hours after a meal 07/21/17  Yes Shadad, Mathis Dad, MD  aspirin 81 MG tablet Take 81 mg by mouth daily.   Yes [provider]  atorvastatin (LIPITOR) 20 MG tablet Take 1 tablet  (20 mg total) by mouth daily. 05/13/17  Yes Paz, [name], MD  azelastine (ASTELIN) 0.1 % nasal spray Place 2 sprays into both nostrils at bedtime as needed for rhinitis. Use in each nostril as directed 07/31/16  Yes Paz, [name], MD  carvedilol (COREG) 12.5 MG tablet Take 1 tablet (12.5 mg total) by mouth 2 (two) times daily with a meal. 07/04/17  Yes Paz, [name], MD  Leuprolide Acetate (LUPRON DEPOT, 57-MONTH, IM) Inject 1 each into the muscle. Every 6 months per Urology   Yes [provider]  levothyroxine (SYNTHROID, LEVOTHROID) 50 MCG tablet Take 1 tablet (50 mcg total) by mouth daily before breakfast. 06/24/17  Yes Paz, [name], MD  lisinopril-hydrochlorothiazide (PRINZIDE,ZESTORETIC) 20-12.5 MG tablet Take 2 tablets by mouth daily. 07/10/17  Yes Paz, [name], MD  nitroGLYCERIN (NITROSTAT) 0.4 MG SL tablet Place 1 tablet (0.4 mg total) under the tongue every 5 (five) minutes as needed for chest pain. 08/30/14  Yes Belva Crome, MD  predniSONE (DELTASONE) 5 MG tablet take 1 tablet by mouth once daily WITH BREAKFAST. 06/19/17  Yes Wyatt Portela, MD  sertraline (ZOLOFT) 25 MG tablet Take 1 tablet (25 mg total) by mouth daily. 05/13/17  Yes Paz, [name], MD  amLODipine (NORVASC) 5 MG tablet Take 1 tablet (5 mg total) by mouth daily. 07/17/17   Colon Branch, MD  meloxicam [facility]) 7.5 MG tablet take 1 tablet by mouth once daily Patient not taking: Reported on 06/24/2017 10/04/16   Saguier, Percell Miller, PA-C    Family History Family History  Problem Relation Age of Onset  . Dementia Mother   . Hypertension Mother   . Coronary artery disease Father   . Diabetes Mellitus II Father   . Coronary artery disease Brother   . Hypertension Brother   . Colon cancer Neg Hx     Social History Social History  Substance Use Topics  . Smoking status: Former Smoker    Types: Cigarettes    Quit date: 07/18/1990  . Smokeless tobacco: Former Systems developer     Comment: 1991  . Alcohol use Yes     Comment: h/o abuse,  currently has slow done but still drinks sometimes      Allergies   Patient has no known allergies.   Review of Systems Review of Systems  All other systems reviewed and are negative.    Physical Exam Updated Vital Signs BP (!) 145/70 (BP Location: Left Arm)   Pulse 65   Resp 18   Ht 1.829 m (6')   Wt 110.7 kg (244 lb)   SpO2 96%   BMI 33.09 kg/m   Physical Exam  Constitutional: He appears well-developed and well-nourished. No distress.  HENT:  Head: Normocephalic and atraumatic.  Right Ear: External ear normal.  Left Ear: External ear normal.  Nose: No sinus tenderness. No epistaxis.  No active bleeding, no clots noted, no obvious  source of bleeding  Eyes: Conjunctivae are normal. Right eye exhibits no discharge. Left eye exhibits no discharge. No scleral icterus.  Neck: Neck supple. No tracheal deviation present.  Cardiovascular: Normal rate, regular rhythm and normal heart sounds.   Pulmonary/Chest: Effort normal. No stridor. No respiratory distress. He has no wheezes.  Abdominal: He exhibits no distension.  Musculoskeletal: He exhibits no edema.  Neurological: He is alert. Cranial nerve deficit: no gross deficits.  Skin: Skin is warm and dry. No rash noted.  Psychiatric: He has a normal mood and affect.  Nursing note and vitals reviewed.    ED Treatments / Results  Labs (all labs ordered are listed, but only abnormal results are displayed) Labs Reviewed  BASIC METABOLIC PANEL - Abnormal; Notable for the following:       Result Value   Potassium 3.4 (*)    Glucose, Bld 113 (*)    All other components within normal limits  CBC WITH DIFFERENTIAL/PLATELET - Abnormal; Notable for the following:    Hemoglobin 12.9 (*)    HCT 37.2 (*)    All other components within normal limits    Procedures Procedures (including critical care time)  Medications Ordered in ED Medications - No data to display   Initial Impression / Assessment and Plan / ED Course  I  have reviewed the triage vital signs and the nursing notes.  Pertinent labs & imaging results that were available during my care of the patient were reviewed by me and considered in my medical decision making (see chart for details).   patient presented to the emergency room for evaluation of a nosebleed this morning. Being stopped by the time he arrived in the emergency room. The patient was monitored for a few hours and he had no recurrent episodes. I was unable to localize the source of bleeding on my exam. Laboratory tests are reassuring. We discussed outpatient follow-up with ENT. Use saline nasal spray to keep the nose moist. Return as needed for recurrent symptoms.  Final Clinical Impressions(s) / ED Diagnoses   Final diagnoses:  Epistaxis    New Prescriptions New Prescriptions   No medications on file     Dorie Rank, MD 07/26/17 1257

## 2017-07-28 ENCOUNTER — Encounter: Payer: Self-pay | Admitting: Internal Medicine

## 2017-08-01 ENCOUNTER — Telehealth: Payer: Self-pay | Admitting: Internal Medicine

## 2017-08-01 ENCOUNTER — Emergency Department (HOSPITAL_BASED_OUTPATIENT_CLINIC_OR_DEPARTMENT_OTHER)
Admission: EM | Admit: 2017-08-01 | Discharge: 2017-08-01 | Disposition: A | Discharge status: Home / Self Care | Payer: BLUE CROSS/BLUE SHIELD | Attending: Emergency Medicine | Admitting: Emergency Medicine

## 2017-08-01 ENCOUNTER — Encounter (HOSPITAL_BASED_OUTPATIENT_CLINIC_OR_DEPARTMENT_OTHER): Payer: Self-pay

## 2017-08-01 ENCOUNTER — Encounter: Payer: Self-pay | Admitting: Internal Medicine

## 2017-08-01 ENCOUNTER — Ambulatory Visit: Payer: BLUE CROSS/BLUE SHIELD | Admitting: Medical

## 2017-08-01 DIAGNOSIS — R040 Epistaxis: Secondary | ICD-10-CM | POA: Insufficient documentation

## 2017-08-01 DIAGNOSIS — E039 Hypothyroidism, unspecified: Secondary | ICD-10-CM | POA: Insufficient documentation

## 2017-08-01 DIAGNOSIS — Z7982 Long term (current) use of aspirin: Secondary | ICD-10-CM | POA: Diagnosis not present

## 2017-08-01 DIAGNOSIS — Z79899 Other long term (current) drug therapy: Secondary | ICD-10-CM | POA: Insufficient documentation

## 2017-08-01 DIAGNOSIS — Z0289 Encounter for other administrative examinations: Secondary | ICD-10-CM

## 2017-08-01 DIAGNOSIS — I10 Essential (primary) hypertension: Secondary | ICD-10-CM | POA: Insufficient documentation

## 2017-08-01 DIAGNOSIS — I2510 Atherosclerotic heart disease of native coronary artery without angina pectoris: Secondary | ICD-10-CM | POA: Diagnosis not present

## 2017-08-01 DIAGNOSIS — Z87891 Personal history of nicotine dependence: Secondary | ICD-10-CM | POA: Diagnosis not present

## 2017-08-01 DIAGNOSIS — Z8546 Personal history of malignant neoplasm of prostate: Secondary | ICD-10-CM | POA: Diagnosis not present

## 2017-08-01 DIAGNOSIS — Z955 Presence of coronary angioplasty implant and graft: Secondary | ICD-10-CM | POA: Insufficient documentation

## 2017-08-01 LAB — BASIC METABOLIC PANEL
ANION GAP: 8 (ref 5–15)
BUN: 19 mg/dL (ref 6–20)
CHLORIDE: 109 mmol/L (ref 101–111)
CO2: 23 mmol/L (ref 22–32)
Calcium: 8.7 mg/dL — ABNORMAL LOW (ref 8.9–10.3)
Creatinine, Ser: 0.55 mg/dL — ABNORMAL LOW (ref 0.61–1.24)
GFR calc Af Amer: >60 mL/min (ref >60)
Glucose, Bld: 113 mg/dL — ABNORMAL HIGH (ref 65–99)
POTASSIUM: 3.2 mmol/L — AB (ref 3.5–5.1)
SODIUM: 140 mmol/L (ref 135–145)

## 2017-08-01 LAB — CBC
HCT: 33.6 % — ABNORMAL LOW (ref 39.0–52.0)
HEMOGLOBIN: 11.4 g/dL — AB (ref 13.0–17.0)
MCH: 28.8 pg (ref 26.0–34.0)
MCHC: 33.9 g/dL (ref 30.0–36.0)
MCV: 84.8 fL (ref 78.0–100.0)
Platelets: 165 10*3/uL (ref 150–400)
RBC: 3.96 MIL/uL — AB (ref 4.22–5.81)
RDW: 13.1 % (ref 11.5–15.5)
WBC: 7.5 10*3/uL (ref 4.0–10.5)

## 2017-08-01 MED ORDER — OXYMETAZOLINE HCL 0.05 % NA SOLN
1.0000 | Freq: Once | NASAL | Status: AC
  Administered 2017-08-01: 1 via NASAL

## 2017-08-01 MED ORDER — OXYMETAZOLINE HCL 0.05 % NA SOLN
NASAL | Status: AC
  Filled 2017-08-01: qty 15

## 2017-08-01 MED ORDER — OXYMETAZOLINE HCL 0.05 % NA SOLN: 1.0000 | Freq: Two times a day (BID) | NASAL | 0 refills | Status: DC

## 2017-08-01 MED FILL — NASAL DECONGESTANT 0.05% SP: 0.05 | 20 days supply | Qty: 30 | Fill #0

## 2017-08-01 NOTE — Telephone Encounter (Signed)
Spoke  with the patient, he had a nosebleed x  4 hours yesterday, today he started bleeding again and has not been able to stop. Unable to get ENT appointment for today, he will go to the ER. already put a ENT referral, hopefully they will be able to see him early next week.

## 2017-08-01 NOTE — Telephone Encounter (Signed)
PCP spoke w/ Dr. Erik Obey (ENT) around lunchtime, informed her that Pt had been sent to ED and informed that formal referral has been placed to their office.

## 2017-08-01 NOTE — ED Notes (Signed)
Clamp applied to nose

## 2017-08-01 NOTE — Telephone Encounter (Signed)
Urgent referral placed.

## 2017-08-01 NOTE — Discharge Instructions (Signed)
Follow-up with ear nose and throat.  The number that we have above matches the number on the Internet for their office.  Telephone [phone].  Return for any recurrent bleeding.  If bleeding does occur at home all the clots out sprayed with Neo-Synephrine spray pinch for 20 minutes.  That does not stop the bleeding return for follow-up.

## 2017-08-01 NOTE — ED Triage Notes (Signed)
C/o nose bleeds x 6 days-started again today while at [facility] gait-active nose bleed with pt applying pressure

## 2017-08-01 NOTE — ED Provider Notes (Signed)
[facility] EMERGENCY DEPARTMENT Provider Note   CSN: [id number] Arrival date & time: 08/01/17  1307     History   Chief Complaint Chief Complaint  Patient presents with  . Epistaxis    HPI Gary [name] is a 61 y.o. male.  Patient seen at [facility] for nosebleed October 20.  Patient had intermittent bleeding all week.  Patient was given a referral to ear nose and throat but patient not been able to get through to set up an appointment.  Today right side of the nose started to bleed heavily.  This is been the site of the bleeding.  Patient had a history of small nosebleeds on and off but nothing this ever lasted over a week.  Patient not on blood thinners.  Patient does have a history of hypertension.  Is taking his hypertensive meds.  The patient did have lab work done on October 20.  Patient does use steroid nose sprays for allergies.  This may be a contributing factor.      Past Medical History:  Diagnosis Date  . Anxiety   . CAD (coronary artery disease) 10-2012   h/o stents   . Elevated PSA    Prostate Bx w/ Dr. Tresa [name]  . EtOH dependence ([place])   . Family history of adverse reaction to anesthesia    made father disoriented  . Gross hematuria   . Hepatic steatosis   . Hyperlipidemia LDL goal < 70   . Hypertension   . Metastatic adenocarcinoma to prostate ([place]) 2017   Gleason score 9, metastasis to R external Iliac node and large extraprostatic extension at prostatectomy  . Multiple sclerosis ([place]) 1991   Bilateral optic neuritis, treated and resolved but felt to be a manifestation of MS.  . Renal cyst, left    Bosniak 2K (indeterminant) hyperdense cyst CT 2014, Stable CT in 2016  . Ulcerative colitis [facility])     Patient Active Problem List   Diagnosis Date Noted  . Anxiety and depression 06/25/2017  . Hypothyroidism 06/24/2017  . Prostate cancer ([place]) 11/24/2015  . PCP NOTES >>> 07/12/2015  . Annual physical exam 07/08/2014  . Elevated LFTs  07/08/2014  . EtOH dependence ([place]) 02/15/2014  . Hyperglycemia 02/15/2014  . Ulcerative colitis ([place]) 10/22/2012    Class: Chronic  . Hypertension, essential 10/22/2012    Class: Chronic  . CAD in native artery 10/22/2012    Class: Acute  . Hyperlipidemia 10/22/2012  . H/O multiple sclerosis 10/22/2012    Past Surgical History:  Procedure Laterality Date  . FOOT SURGERY     R toe Fx (screws),  L dorsal FX (screws)  . LEFT HEART CATHETERIZATION WITH CORONARY ANGIOGRAM N/A 10/22/2012   Procedure: LEFT HEART CATHETERIZATION WITH CORONARY ANGIOGRAM;  Surgeon: Sinclair Grooms, MD;  Location: [facility] C[facility] LAB;  Service: Cardiovascular;  Laterality: N/A;  . LYMPHADENECTOMY Bilateral 11/24/2015   Procedure: BILATERAL PELVIC LYMPHADENECTOMY;  Surgeon: Alexis Frock, MD;  Location: [facility] ORS;  Service: Urology;  Laterality: Bilateral;  . PERCUTANEOUS CORONARY STENT INTERVENTION (PCI-S) N/A 10/23/2012   Procedure: PERCUTANEOUS CORONARY STENT INTERVENTION (PCI-S);  Surgeon: Sinclair Grooms, MD;  Location: [facility] CATH LAB;  Service: Cardiovascular;  Laterality: N/A;  . PROSTATE BIOPSY  09/2015   Dr. Tresa [name]  . ROBOT ASSISTED LAPAROSCOPIC RADICAL PROSTATECTOMY N/A 11/24/2015   Procedure: XI ROBOTIC ASSISTED LAPAROSCOPIC RADICAL PROSTATECTOMY WITH INDOCYANINE GREEN DYE;  Surgeon: Alexis Frock, MD;  Location: [facility] ORS;  Service: Urology;  Laterality: N/A;  .  VASECTOMY         Home Medications    Prior to Admission medications   Medication Sig Start Date End Date Taking? Authorizing Provider  abiraterone Acetate (ZYTIGA) 250 MG tablet Take 4 tablets (1,000 mg total) by mouth daily. Take on an empty stomach 1 hour before or 2 hours after a meal 07/21/17   Wyatt Portela, MD  amLODipine (NORVASC) 5 MG tablet Take 1 tablet (5 mg total) by mouth daily. 07/17/17   Colon Branch, MD  aspirin 81 MG tablet Take 81 mg by mouth daily.    [provider]  atorvastatin (LIPITOR) 20 MG tablet Take 1 tablet (20  mg total) by mouth daily. 05/13/17   Colon Branch, MD  azelastine (ASTELIN) 0.1 % nasal spray Place 2 sprays into both nostrils at bedtime as needed for rhinitis. Use in each nostril as directed 07/31/16   Colon Branch, MD  carvedilol (COREG) 12.5 MG tablet Take 1 tablet (12.5 mg total) by mouth 2 (two) times daily with a meal. 07/04/17   Colon Branch, MD  Leuprolide Acetate (LUPRON DEPOT, 106-MONTH, IM) Inject 1 each into the muscle. Every 6 months per Urology    [provider]  levothyroxine (SYNTHROID, LEVOTHROID) 50 MCG tablet Take 1 tablet (50 mcg total) by mouth daily before breakfast. 06/24/17   Colon Branch, MD  lisinopril-hydrochlorothiazide (PRINZIDE,ZESTORETIC) 20-12.5 MG tablet Take 2 tablets by mouth daily. 07/10/17   Colon Branch, MD  meloxicam [facility]) 7.5 MG tablet take 1 tablet by mouth once daily Patient not taking: Reported on 06/24/2017 10/04/16   Saguier, Percell Miller, PA-C  nitroGLYCERIN (NITROSTAT) 0.4 MG SL tablet Place 1 tablet (0.4 mg total) under the tongue every 5 (five) minutes as needed for chest pain. 08/30/14   Belva Crome, MD  predniSONE (DELTASONE) 5 MG tablet take 1 tablet by mouth once daily WITH BREAKFAST. 06/19/17   Wyatt Portela, MD  sertraline (ZOLOFT) 25 MG tablet Take 1 tablet (25 mg total) by mouth daily. 05/13/17   Colon Branch, MD    Family History Family History  Problem Relation Age of Onset  . Dementia Mother   . Hypertension Mother   . Coronary artery disease Father   . Diabetes Mellitus II Father   . Coronary artery disease Brother   . Hypertension Brother   . Colon cancer Neg Hx     Social History Social History  Substance Use Topics  . Smoking status: Former Smoker    Quit date: 07/18/1990  . Smokeless tobacco: Former Systems developer     Comment: 1991  . Alcohol use Yes     Comment: weekly     Allergies   Patient has no known allergies.   Review of Systems Review of Systems  Constitutional: Negative for fever.  HENT: Positive for nosebleeds.  Negative for congestion.   Eyes: Negative for visual disturbance.  Respiratory: Negative for shortness of breath.   Cardiovascular: Negative for chest pain.  Gastrointestinal: Negative for abdominal pain.  Genitourinary: Negative for dysuria and hematuria.  Musculoskeletal: Negative for neck pain.  Skin: Negative for rash.  Allergic/Immunologic: Negative for immunocompromised state.  Neurological: Negative for syncope and light-headedness.  Hematological: Does not bruise/bleed easily.  Psychiatric/Behavioral: Negative for confusion.     Physical Exam Updated Vital Signs BP (!) 156/104 (BP Location: Left Arm)   Pulse 94   Temp 98.1 F (36.7 C) (Oral)   Resp 20   Ht 1.829 m (6')  Wt 112.4 kg (247 lb 12.8 oz)   SpO2 100%   BMI 33.61 kg/m   Physical Exam  Constitutional: He is oriented to person, place, and time. He appears well-developed and well-nourished. No distress.  HENT:  Head: Normocephalic and atraumatic.  Mouth/Throat: Oropharynx is clear and moist.  No active bleeding from the right nares.  Left nares fairly clear.  Some old clot in right nares.  No blood draining down the back of the throat currently.  Eyes: Pupils are equal, round, and reactive to light. Conjunctivae and EOM are normal.  Neck: Normal range of motion. Neck supple.  Cardiovascular: Normal rate, regular rhythm and normal heart sounds.   Pulmonary/Chest: Effort normal and breath sounds normal. No respiratory distress.  Abdominal: Soft. Bowel sounds are normal. There is no tenderness.  Musculoskeletal: Normal range of motion.  Lymphadenopathy:    He has no cervical adenopathy.  Neurological: He is alert and oriented to person, place, and time. No cranial nerve deficit or sensory deficit. He exhibits normal muscle tone. Coordination normal.  Skin: Skin is warm.  Nursing note and vitals reviewed.    ED Treatments / Results  Labs (all labs ordered are listed, but only abnormal results are  displayed) Labs Reviewed  BASIC METABOLIC PANEL - Abnormal; Notable for the following:       Result Value   Potassium 3.2 (*)    Glucose, Bld 113 (*)    Creatinine, Ser 0.55 (*)    Calcium 8.7 (*)    All other components within normal limits  CBC - Abnormal; Notable for the following:    RBC 3.96 (*)    Hemoglobin 11.4 (*)    HCT 33.6 (*)    All other components within normal limits    EKG  EKG Interpretation None       Radiology No results found.  Procedures Procedures (including critical care time)  Medications Ordered in ED Medications  oxymetazoline (AFRIN) 0.05 % nasal spray 1 spray (1 spray Each Nare Given by Other 08/01/17 1438)     Initial Impression / Assessment and Plan / ED Course  I have reviewed the triage vital signs and the nursing notes.  Pertinent labs & imaging results that were available during my care of the patient were reviewed by me and considered in my medical decision making (see chart for details).     Patient knows pinching done upon arrival with a clip here.  This was removed after to been in place for over 20 minutes.  No started to rebleed.  Had patient blow all the clots out of the nose.  Most of them came out the right side.  Sprayed with Neo-Synephrine on both nares.  And re-pinched for another 20 minutes.  Removed the pincher and bleeding controlled.  Patient's labs without significant slight decrease in hemoglobin compared to October 20.  I am aware the patient does have hypertension.  But the Neo-Synephrine was necessary to help control the bleeding.     Final Clinical Impressions(s) / ED Diagnoses   Final diagnoses:  Epistaxis    New Prescriptions New Prescriptions   No medications on file     Fredia Sorrow, MD 08/01/17 [phone]

## 2017-08-01 NOTE — Telephone Encounter (Signed)
Enter ENT referral, nosebleeds, could he be seen Monday or Tuesday?  Received: Today  Message Contents  Colon Branch, MD  Damita Dunnings, [place]

## 2017-08-07 DIAGNOSIS — R040 Epistaxis: Secondary | ICD-10-CM | POA: Diagnosis not present

## 2017-08-07 DIAGNOSIS — I10 Essential (primary) hypertension: Secondary | ICD-10-CM | POA: Diagnosis not present

## 2017-08-07 DIAGNOSIS — R0981 Nasal congestion: Secondary | ICD-10-CM | POA: Diagnosis not present

## 2017-08-07 DIAGNOSIS — C61 Malignant neoplasm of prostate: Secondary | ICD-10-CM | POA: Diagnosis not present

## 2017-08-08 ENCOUNTER — Encounter: Payer: Self-pay | Admitting: Internal Medicine

## 2017-08-08 ENCOUNTER — Ambulatory Visit (INDEPENDENT_AMBULATORY_CARE_PROVIDER_SITE_OTHER): Payer: BLUE CROSS/BLUE SHIELD | Admitting: Internal Medicine

## 2017-08-08 VITALS — BP 124/68 | HR 61 | Temp 97.8°F | Resp 14 | Ht 72.0 in | Wt 245.4 lb

## 2017-08-08 DIAGNOSIS — R040 Epistaxis: Secondary | ICD-10-CM

## 2017-08-08 DIAGNOSIS — Z0000 Encounter for general adult medical examination without abnormal findings: Secondary | ICD-10-CM

## 2017-08-08 DIAGNOSIS — D649 Anemia, unspecified: Secondary | ICD-10-CM | POA: Diagnosis not present

## 2017-08-08 LAB — CBC WITH DIFFERENTIAL/PLATELET
BASOS PCT: 1 % (ref 0.0–3.0)
Basophils Absolute: 0.1 10*3/uL (ref 0.0–0.1)
EOS ABS: 0.2 10*3/uL (ref 0.0–0.7)
Eosinophils Relative: 2.5 % (ref 0.0–5.0)
HEMATOCRIT: 30.1 % — AB (ref 39.0–52.0)
Hemoglobin: 10.2 g/dL — ABNORMAL LOW (ref 13.0–17.0)
LYMPHS PCT: 27.5 % (ref 12.0–46.0)
Lymphs Abs: 2.1 10*3/uL (ref 0.7–4.0)
MCHC: 33.9 g/dL (ref 30.0–36.0)
MCV: 86.9 fl (ref 78.0–100.0)
MONOS PCT: 5.6 % (ref 3.0–12.0)
Monocytes Absolute: 0.4 10*3/uL (ref 0.1–1.0)
NEUTROS ABS: 4.9 10*3/uL (ref 1.4–7.7)
Neutrophils Relative %: 63.4 % (ref 43.0–77.0)
PLATELETS: 220 10*3/uL (ref 150.0–400.0)
RBC: 3.47 Mil/uL — ABNORMAL LOW (ref 4.22–5.81)
RDW: 14.1 % (ref 11.5–15.5)
WBC: 7.6 10*3/uL (ref 4.0–10.5)

## 2017-08-08 LAB — BASIC METABOLIC PANEL
BUN: 10 mg/dL (ref 6–23)
CHLORIDE: 103 meq/L (ref 96–112)
CO2: 30 meq/L (ref 19–32)
CREATININE: 0.69 mg/dL (ref 0.40–1.50)
Calcium: 9.7 mg/dL (ref 8.4–10.5)
GFR: 151.01 mL/min (ref >60.00)
Glucose, Bld: 119 mg/dL — ABNORMAL HIGH (ref 70–99)
Potassium: 3.8 mEq/L (ref 3.5–5.1)
Sodium: 141 mEq/L (ref 135–145)

## 2017-08-08 LAB — LIPID PANEL
CHOL/HDL RATIO: 3
Cholesterol: 123 mg/dL (ref 0–200)
HDL: 35.3 mg/dL — ABNORMAL LOW (ref >39.00)
LDL Cholesterol: 54 mg/dL (ref 0–99)
NONHDL: 87.9
Triglycerides: 168 mg/dL — ABNORMAL HIGH (ref 0.0–149.0)
VLDL: 33.6 mg/dL (ref 0.0–40.0)

## 2017-08-08 NOTE — Patient Instructions (Signed)
GO TO THE LAB : Get the blood work     GO TO THE FRONT DESK Schedule labs to be done in 2 months, TSH  Schedule your next appointment for a  routine visit in 4 months  Go back on aspirin a couple of days, watch for nosebleeds  Check the  blood pressure  weekly   Be sure your blood pressure is between 110/65 and  135/85. If it is consistently higher or lower, let me know

## 2017-08-08 NOTE — Progress Notes (Signed)
Subjective:    Patient ID: Gary [name], male    DOB: 1956/05/14, 61 y.o.   MRN: [id number]  DOS:  08/08/2017 Type of visit - description : cpx Interval history: In general feeling well. Has severe nosebleeding, saw ENT yesterday, note reviewed. No bleeding today. Self holding aspirin for few days. Recent ambulatory BPs normal.  Review of Systems  Denies chest pain or difficulty breathing  A 14 point review of systems is negative    Past Medical History:  Diagnosis Date  . Anxiety   . CAD (coronary artery disease) 10-2012   h/o stents   . Elevated PSA    Prostate Bx w/ Dr. Tresa [name]  . EtOH dependence ([place])   . Family history of adverse reaction to anesthesia    made father disoriented  . Gross hematuria   . Hepatic steatosis   . Hyperlipidemia LDL goal < 70   . Hypertension   . Metastatic adenocarcinoma to prostate ([place]) 2017   Gleason score 9, metastasis to R external Iliac node and large extraprostatic extension at prostatectomy  . Multiple sclerosis ([place]) 1991   Bilateral optic neuritis, treated and resolved but felt to be a manifestation of MS.  . Renal cyst, left    Bosniak 2K (indeterminant) hyperdense cyst CT 2014, Stable CT in 2016  . Ulcerative colitis [facility])     Past Surgical History:  Procedure Laterality Date  . FOOT SURGERY     R toe Fx (screws),  L dorsal FX (screws)  . LEFT HEART CATHETERIZATION WITH CORONARY ANGIOGRAM N/A 10/22/2012   Procedure: LEFT HEART CATHETERIZATION WITH CORONARY ANGIOGRAM;  Surgeon: Sinclair Grooms, MD;  Location: [facility] CATH LAB;  Service: Cardiovascular;  Laterality: N/A;  . LYMPHADENECTOMY Bilateral 11/24/2015   Procedure: BILATERAL PELVIC LYMPHADENECTOMY;  Surgeon: Alexis Frock, MD;  Location: [facility] ORS;  Service: Urology;  Laterality: Bilateral;  . PERCUTANEOUS CORONARY STENT INTERVENTION (PCI-S) N/A 10/23/2012   Procedure: PERCUTANEOUS CORONARY STENT INTERVENTION (PCI-S);  Surgeon: Sinclair Grooms, MD;  Location: [facility] CATH  LAB;  Service: Cardiovascular;  Laterality: N/A;  . PROSTATE BIOPSY  09/2015   Dr. Tresa [name]  . ROBOT ASSISTED LAPAROSCOPIC RADICAL PROSTATECTOMY N/A 11/24/2015   Procedure: XI ROBOTIC ASSISTED LAPAROSCOPIC RADICAL PROSTATECTOMY WITH INDOCYANINE GREEN DYE;  Surgeon: Alexis Frock, MD;  Location: [facility] ORS;  Service: Urology;  Laterality: N/A;  . VASECTOMY     Family History  Problem Relation Age of Onset  . Dementia Mother   . Hypertension Mother   . Coronary artery disease Father   . Diabetes Mellitus II Father   . Coronary artery disease Brother   . Hypertension Brother   . Prostate cancer Brother   . Colon cancer Neg Hx     Social History   Social History  . Marital status: Married    Spouse name: N/A  . Number of children: 1  . Years of education: N/A   Occupational History  . Managment  [employer]   Social History Main Topics  . Smoking status: Former Smoker    Quit date: 07/18/1990  . Smokeless tobacco: Former Systems developer     Comment: 1991  . Alcohol use Yes     Comment: weekly  . Drug use: Yes    Types: Marijuana  . Sexual activity: Not on file   Other Topics Concern  . Not on file   Social History Narrative   Household-- pt and wife   Has an adult son  Allergies as of 08/08/2017   No Known Allergies     Medication List       Accurate as of 08/08/17  5:36 PM. Always use your most recent med list.          abiraterone Acetate 250 MG tablet Commonly known as:  ZYTIGA Take 4 tablets (1,000 mg total) by mouth daily. Take on an empty stomach 1 hour before or 2 hours after a meal   amLODipine 5 MG tablet Commonly known as:  NORVASC Take 1 tablet (5 mg total) by mouth daily.   aspirin 81 MG tablet Take 81 mg by mouth daily.   atorvastatin 20 MG tablet Commonly known as:  LIPITOR Take 1 tablet (20 mg total) by mouth daily.   carvedilol 12.5 MG tablet Commonly known as:  COREG Take 1 tablet (12.5 mg total) by mouth 2 (two) times daily with a meal.     levothyroxine 50 MCG tablet Commonly known as:  SYNTHROID, LEVOTHROID Take 1 tablet (50 mcg total) by mouth daily before breakfast.   lisinopril-hydrochlorothiazide 20-12.5 MG tablet Commonly known as:  PRINZIDE,ZESTORETIC Take 2 tablets by mouth daily.   LUPRON DEPOT (35-MONTH) IM Inject 1 each into the muscle. Every 6 months per Urology   meloxicam 7.5 MG tablet Commonly known as:  MOBIC take 1 tablet by mouth once daily   NASAL DECONGESTANT SPRAY 0.05 % nasal spray Generic drug:  oxymetazoline As directed   nitroGLYCERIN 0.4 MG SL tablet Commonly known as:  NITROSTAT Place 1 tablet (0.4 mg total) under the tongue every 5 (five) minutes as needed for chest pain.   predniSONE 5 MG tablet Commonly known as:  DELTASONE take 1 tablet by mouth once daily WITH BREAKFAST.   sertraline 25 MG tablet Commonly known as:  ZOLOFT Take 1 tablet (25 mg total) by mouth daily.   sodium chloride 0.65 % Soln nasal spray Commonly known as:  OCEAN Place 1 spray into both nostrils as needed for congestion.          Objective:   Physical Exam BP 124/68 (BP Location: Left Arm, Patient Position: Sitting, Cuff Size: Small)   Pulse 61   Temp 97.8 F (36.6 C) (Oral)   Resp 14   Ht 6' (1.829 m)   Wt 245 lb 6 oz (111.3 kg)   SpO2 97%   BMI 33.28 kg/m   General:   Well developed, well nourished . NAD.  Neck: No  thyromegaly  HEENT:  Normocephalic . Face symmetric, atraumatic Lungs:  CTA B Normal respiratory effort, no intercostal retractions, no accessory muscle use. Heart: RRR,  no murmur.  No pretibial edema bilaterally  Abdomen:  Not distended, soft, non-tender. No rebound or rigidity.   Skin: Exposed areas without rash. Not pale. Not jaundice Neurologic:  alert & oriented X3.  Speech normal, gait appropriate for age and unassisted Strength symmetric and appropriate for age.  Psych: Cognition and judgment appear intact.  Cooperative with normal attention span and  concentration.  Behavior appropriate. No anxious or depressed appearing.    Assessment & Plan:   Assessment  Pre-diabetes HTN Hyperlipidemia Anxiety CAD s/p stents 2014  EtOH dependency Ulcerative colitis- last cscope 05-2015 Metastatic prostate cancer, DX 11-2015, radical prostatectomy 11-2015 --  continent as off  07-2016--on androgen depravation  Hematuria, saw urology 01-2013 reviewed: CT show a renal cyst, cystoscopy negative, they recommended follow-up MRI for the renal cyst. MS  Dx 1991 (B neuritis, rx, resolved, felt to be d/t MS) Bilateral  thenar atrophy noted 06-2017 (apparently chronic) Nose bleed: R nostril artery cauterize 08-2017  PLAN Prediabetes: Last A1c satisfactory. HTN: Currently on carvedilol, amlodipine and Zestoretic. Will call and tell us the dose of Zestoretic he is actually taking, will RF w/ that info. BP today is great, recent ambulatory BPs normal. Checking labs. High cholesterol: Last LDL 85, rechecked today, currently on Lipitor 20. CAD: Asxc, last cardiology visit 2016. Plan is to control CV RF Nosebleed: Saw ENT yesterday, an artery was cauterized from the right nostril, was recommended to avoid nasal sprays x a week, but will avoid them for now. He is holding aspirin for few days, rec to go back on them in a couple of days. Hemoglobin was a slightly low when he went to the ER , rechecked today. Hypothyroidism: TSH in 2 months RTC 4 months

## 2017-08-08 NOTE — Progress Notes (Signed)
Pre visit review using our clinic review tool, if applicable. No additional management support is needed unless otherwise documented below in the visit note. 

## 2017-08-08 NOTE — Assessment & Plan Note (Signed)
Td 2015, pnm 23-2015; prevnar--2017;  Had a flu s-hot -CCS S/p colonoscopy (~ 2011?) showed adenomatous polyps Cscope 05-2015: mild erythema, no polyps, no bx, next 5 years  -H/o Prostate cancer  . -Labs reviewed, due for a BMP, CBC and FLP

## 2017-08-08 NOTE — Assessment & Plan Note (Signed)
Prediabetes: Last A1c satisfactory. HTN: Currently on carvedilol, amlodipine and Zestoretic. Will call and tell us the dose of Zestoretic he is actually taking, will RF w/ that info. BP today is great, recent ambulatory BPs normal. Checking labs. High cholesterol: Last LDL 85, rechecked today, currently on Lipitor 20. CAD: Asxc, last cardiology visit 2016. Plan is to control CV RF Nosebleed: Saw ENT yesterday, an artery was cauterized from the right nostril, was recommended to avoid nasal sprays x a week, but will avoid them for now. He is holding aspirin for few days, rec to go back on them in a couple of days. Hemoglobin was a slightly low when he went to the ER , rechecked today. Hypothyroidism: TSH in 2 months RTC 4 months

## 2017-08-11 ENCOUNTER — Other Ambulatory Visit: Payer: Self-pay | Admitting: Oncology

## 2017-08-11 DIAGNOSIS — C61 Malignant neoplasm of prostate: Secondary | ICD-10-CM

## 2017-08-11 MED ORDER — FERROUS SULFATE 325 (65 FE) MG PO TABS: 325.0000 mg | ORAL_TABLET | Freq: Two times a day (BID) | ORAL | 3 refills | Status: DC

## 2017-08-16 ENCOUNTER — Encounter (HOSPITAL_BASED_OUTPATIENT_CLINIC_OR_DEPARTMENT_OTHER): Payer: Self-pay | Admitting: Emergency Medicine

## 2017-08-16 ENCOUNTER — Other Ambulatory Visit: Payer: Self-pay

## 2017-08-16 ENCOUNTER — Emergency Department (HOSPITAL_BASED_OUTPATIENT_CLINIC_OR_DEPARTMENT_OTHER)
Admission: EM | Admit: 2017-08-16 | Discharge: 2017-08-17 | Disposition: A | Discharge status: Home / Self Care | Payer: BLUE CROSS/BLUE SHIELD | Attending: Emergency Medicine | Admitting: Emergency Medicine

## 2017-08-16 DIAGNOSIS — I10 Essential (primary) hypertension: Secondary | ICD-10-CM | POA: Insufficient documentation

## 2017-08-16 DIAGNOSIS — Z87891 Personal history of nicotine dependence: Secondary | ICD-10-CM | POA: Insufficient documentation

## 2017-08-16 DIAGNOSIS — R040 Epistaxis: Secondary | ICD-10-CM | POA: Insufficient documentation

## 2017-08-16 DIAGNOSIS — D500 Iron deficiency anemia secondary to blood loss (chronic): Secondary | ICD-10-CM | POA: Insufficient documentation

## 2017-08-16 DIAGNOSIS — Z7982 Long term (current) use of aspirin: Secondary | ICD-10-CM | POA: Insufficient documentation

## 2017-08-16 DIAGNOSIS — I2510 Atherosclerotic heart disease of native coronary artery without angina pectoris: Secondary | ICD-10-CM | POA: Diagnosis not present

## 2017-08-16 DIAGNOSIS — Z79899 Other long term (current) drug therapy: Secondary | ICD-10-CM | POA: Diagnosis not present

## 2017-08-16 DIAGNOSIS — E039 Hypothyroidism, unspecified: Secondary | ICD-10-CM | POA: Diagnosis not present

## 2017-08-16 DIAGNOSIS — Z8546 Personal history of malignant neoplasm of prostate: Secondary | ICD-10-CM | POA: Insufficient documentation

## 2017-08-16 LAB — CBC
HEMATOCRIT: 28.2 % — AB (ref 39.0–52.0)
HEMOGLOBIN: 9.3 g/dL — AB (ref 13.0–17.0)
MCH: 29.2 pg (ref 26.0–34.0)
MCHC: 33 g/dL (ref 30.0–36.0)
MCV: 88.7 fL (ref 78.0–100.0)
Platelets: 266 10*3/uL (ref 150–400)
RBC: 3.18 MIL/uL — AB (ref 4.22–5.81)
RDW: 14.1 % (ref 11.5–15.5)
WBC: 8.1 10*3/uL (ref 4.0–10.5)

## 2017-08-16 MED ORDER — SILVER NITRATE-POT NITRATE 75-25 % EX MISC
CUTANEOUS | Status: AC
  Filled 2017-08-16: qty 1

## 2017-08-16 MED ORDER — ONDANSETRON 8 MG PO TBDP
8.0000 mg | ORAL_TABLET | Freq: Once | ORAL | Status: AC
  Administered 2017-08-16: 8 mg via ORAL
  Filled 2017-08-16: qty 1

## 2017-08-16 MED ORDER — SODIUM CHLORIDE 0.9 % IV BOLUS (SEPSIS)
1000.0000 mL | Freq: Once | INTRAVENOUS | Status: AC
  Administered 2017-08-16: 1000 mL via INTRAVENOUS

## 2017-08-16 MED ORDER — OXYMETAZOLINE HCL 0.05 % NA SOLN
1.0000 | Freq: Once | NASAL | Status: AC
  Administered 2017-08-16: 1 via NASAL
  Filled 2017-08-16: qty 15

## 2017-08-16 NOTE — ED Notes (Signed)
C/o feeling worse, nauseated, and hot.  BP lower. Reclined, rales up, EDP notified of vagal response.

## 2017-08-16 NOTE — ED Notes (Addendum)
Alert, NAD, calm, interactive, resps e/u, speaking in clear complete sentences, no dyspnea noted, skin W&D, VSS, here for R epistaxis, on and off x2 days, no relief with pressure or afrin PTA, h/o similar/multiple, BP 139/91, has an ENT, pinpoints to "R septum", (denies: pain, sob, weakness, lethargy, nausea, dizziness or visual changes). EDPA at [facility]. Actively spitting up blood clots. Stopped ASA 10d ago. Relates bleeding to [place] med zytiga.

## 2017-08-16 NOTE — ED Notes (Signed)
Packing placed in R nare by EDP. Tolerated well. Bleeding controlled at this time. Reports some drainage down back of throat.  Rinsed and spit x2.

## 2017-08-16 NOTE — ED Provider Notes (Signed)
[facility]GENCY DEPARTMENT Provider Note   CSN: [id number] Arrival date & time: 08/16/17  2219     History   Chief Complaint Chief Complaint  Patient presents with  . Epistaxis    HPI Gary [name] is a 61 y.o. male.  Patient presents with ongoing nosebleed from the left nare.  Patient has had difficulty with nosebleeds over the past several weeks.  He has seen his PCP, ENT, and been to the emergency department.  Patient had a cauterization with silver nitrate by ENT recently.  Bleeding started yesterday after bending over.  It has been intermittent until 4 PM today when patient has had more sustained bleeding.  No nausea or vomiting.  Patient has noted clots down the back of his throat.  No lightheadedness or syncope.  No fatigue.  Patient was started on iron by his primary care physician.  He does have a history of prostate cancer.  He has discontinued daily aspirin therapy.  History of alcohol use.      Past Medical History:  Diagnosis Date  . Anxiety   . CAD (coronary artery disease) 10-2012   h/o stents   . Elevated PSA    Prostate Bx w/ Dr. Tresa [name]  . EtOH dependence ([place])   . Family history of adverse reaction to anesthesia    made father disoriented  . Gross hematuria   . Hepatic steatosis   . Hyperlipidemia LDL goal < 70   . Hypertension   . Metastatic adenocarcinoma to prostate ([place]) 2017   Gleason score 9, metastasis to R external Iliac node and large extraprostatic extension at prostatectomy  . Multiple sclerosis ([place]) 1991   Bilateral optic neuritis, treated and resolved but felt to be a manifestation of MS.  . Renal cyst, left    Bosniak 2K (indeterminant) hyperdense cyst CT 2014, Stable CT in 2016  . Ulcerative colitis [facility])     Patient Active Problem List   Diagnosis Date Noted  . Epistaxis 08/07/2017  . Anxiety and depression 06/25/2017  . Hypothyroidism 06/24/2017  . Prostate cancer ([place]) 11/24/2015  . PCP NOTES >>> 07/12/2015    . Annual physical exam 07/08/2014  . Elevated LFTs 07/08/2014  . EtOH dependence ([place]) 02/15/2014  . Hyperglycemia 02/15/2014  . Ulcerative colitis ([place]) 10/22/2012    Class: Chronic  . Hypertension, essential 10/22/2012    Class: Chronic  . CAD in native artery 10/22/2012    Class: Acute  . Hyperlipidemia 10/22/2012  . H/O multiple sclerosis 10/22/2012    Past Surgical History:  Procedure Laterality Date  . FOOT SURGERY     R toe Fx (screws),  L dorsal FX (screws)  . PROSTATE BIOPSY  09/2015   Dr. Tresa [name]  . VASECTOMY         Home Medications    Prior to Admission medications   Medication Sig Start Date End Date Taking? Authorizing Provider  amLODipine (NORVASC) 5 MG tablet Take 1 tablet (5 mg total) by mouth daily. 07/17/17   Colon Branch, MD  aspirin 81 MG tablet Take 81 mg by mouth daily.    [provider]  atorvastatin (LIPITOR) 20 MG tablet Take 1 tablet (20 mg total) by mouth daily. 05/13/17   Colon Branch, MD  carvedilol (COREG) 12.5 MG tablet Take 1 tablet (12.5 mg total) by mouth 2 (two) times daily with a meal. 07/04/17   Colon Branch, MD  ferrous sulfate 325 (65 FE) MG tablet Take 1 tablet (325  mg total) 2 (two) times daily with a meal by mouth. 08/11/17   Colon Branch, MD  Leuprolide Acetate (LUPRON DEPOT, 73-MONTH, IM) Inject 1 each into the muscle. Every 6 months per Urology    [provider]  levothyroxine (SYNTHROID, LEVOTHROID) 50 MCG tablet Take 1 tablet (50 mcg total) by mouth daily before breakfast. 06/24/17   Colon Branch, MD  lisinopril-hydrochlorothiazide (PRINZIDE,ZESTORETIC) 20-12.5 MG tablet Take 2 tablets by mouth daily. 07/10/17   Colon Branch, MD  meloxicam [facility]) 7.5 MG tablet take 1 tablet by mouth once daily Patient not taking: Reported on 06/24/2017 10/04/16   Saguier, Percell Miller, PA-C  nitroGLYCERIN (NITROSTAT) 0.4 MG SL tablet Place 1 tablet (0.4 mg total) under the tongue every 5 (five) minutes as needed for chest pain. Patient not  taking: Reported on 08/08/2017 08/30/14   Belva Crome, MD  oxymetazoline (NASAL DECONGESTANT SPRAY) 0.05 % nasal spray As directed 08/01/17   [provider]  predniSONE (DELTASONE) 5 MG tablet take 1 tablet by mouth once daily WITH BREAKFAST. 06/19/17   Wyatt Portela, MD  sertraline (ZOLOFT) 25 MG tablet Take 1 tablet (25 mg total) by mouth daily. 05/13/17   Colon Branch, MD  sodium chloride (OCEAN) 0.65 % SOLN nasal spray Place 1 spray into both nostrils as needed for congestion.    [provider]  ZYTIGA 250 MG tablet TAKE 4 TABLETS (1000MG) BY MOUTH EVERY DAY ON AN EMPTY STOMACH 1 HOUR BEFORE OR 2 HOURS AFTER A MEAL 08/11/17   Wyatt Portela, MD    Family History Family History  Problem Relation Age of Onset  . Dementia Mother   . Hypertension Mother   . Coronary artery disease Father   . Diabetes Mellitus II Father   . Coronary artery disease Brother   . Hypertension Brother   . Prostate cancer Brother   . Colon cancer Neg Hx     Social History Social History   Tobacco Use  . Smoking status: Former Smoker    Last attempt to quit: 07/18/1990    Years since quitting: 27.0  . Smokeless tobacco: Former Systems developer  . Tobacco comment: 1991  Substance Use Topics  . Alcohol use: Yes    Comment: weekly  . Drug use: Yes    Types: Marijuana     Allergies   Patient has no known allergies.   Review of Systems Review of Systems  Constitutional: Negative for fever.  HENT: Positive for nosebleeds. Negative for rhinorrhea and sore throat.   Eyes: Negative for redness.  Respiratory: Negative for cough.   Cardiovascular: Negative for chest pain.  Gastrointestinal: Negative for abdominal pain, diarrhea, nausea and vomiting.  Genitourinary: Negative for dysuria.  Musculoskeletal: Negative for myalgias.  Skin: Negative for rash.  Neurological: Negative for headaches.     Physical Exam Updated Vital Signs BP (!) 139/91 (BP Location: Left Arm)   Pulse 83   Temp  97.9 F (36.6 C) (Axillary)   Resp 19   SpO2 100%   Physical Exam  Constitutional: He appears well-developed and well-nourished.  HENT:  Head: Normocephalic and atraumatic.  Nose: Mucosal edema present. No nasal septal hematoma. Epistaxis (brisk, R nasal septum) is observed.  Eyes: Conjunctivae are normal.  Neck: Normal range of motion. Neck supple.  Pulmonary/Chest: No respiratory distress.  Neurological: He is alert.  Skin: Skin is warm and dry.  Psychiatric: He has a normal mood and affect.  Nursing note and vitals reviewed.  ED Treatments / Results  Labs (all labs ordered are listed, but only abnormal results are displayed) Labs Reviewed  CBC - Abnormal; Notable for the following components:      Result Value   RBC 3.18 (*)    Hemoglobin 9.3 (*)    HCT 28.2 (*)    All other components within normal limits  BASIC METABOLIC PANEL - Abnormal; Notable for the following components:   Potassium 3.3 (*)    Glucose, Bld 120 (*)    Creatinine, Ser 0.60 (*)    Calcium 8.6 (*)    All other components within normal limits    EKG  EKG Interpretation None       Radiology No results found.  Procedures .Epistaxis Management Date/Time: 08/17/2017 12:39 AM Performed by: Carlisle Cater, PA-C Authorized by: Carlisle Cater, PA-C   Consent:    Consent obtained:  Verbal   Consent given by:  Patient   Risks discussed:  Infection and pain   Alternatives discussed:  No treatment Procedure details:    Treatment site:  R septum and R anterior   Treatment method:  Anterior pack and nasal balloon   Treatment episode: recurring   Post-procedure details:    Assessment:  Bleeding stopped   Patient tolerance of procedure:  Tolerated well, no immediate complications Comments:     5.5cm rapid rhino placed   (including critical care time)  Medications Ordered in ED Medications  silver nitrate applicators 50-27 % applicator (not administered)  oxymetazoline (AFRIN) 0.05 %  nasal spray 1 spray (1 spray Each Nare Given 08/16/17 2254)  ondansetron (ZOFRAN-ODT) disintegrating tablet 8 mg (8 mg Oral Given 08/16/17 2306)  sodium chloride 0.9 % bolus 1,000 mL (0 mLs Intravenous Stopped 08/17/17 0000)     Initial Impression / Assessment and Plan / ED Course  I have reviewed the triage vital signs and the nursing notes.  Pertinent labs & imaging results that were available during my care of the patient were reviewed by me and considered in my medical decision making (see chart for details).     Patient seen and examined. Work-up initiated. Medications ordered.   Vital signs reviewed and are as follows: BP 103/87   Pulse 71   Temp 97.9 F (36.6 C) (Axillary)   Resp 19   SpO2 100%   11:23 PM patient with vasovagal episode after packing placed.  Fluids ordered.  Blood counts continued to trend down.  Do not feel the patient requires transfusion at this time.  Improvement in bleeding since packing placed.  12:38 AM patient feeling better after fluids other than having the packing in his nose.  He has been up to the restroom without any trouble.  We will discharged home with Augmentin and antihistamine patient request to help him sleep.  Encouraged return with worsening bleeding.  Encouraged him to call the ENT as soon as possible for follow-up appointment.  He will need to have his blood counts rechecked to ensure that they were improving.  Rechecked inflation, balloon is slightly firm but easily ballotable.   Final Clinical Impressions(s) / ED Diagnoses   Final diagnoses:  Right-sided epistaxis  Chronic blood loss anemia   Patient with right-sided bleeding from his nose as well as worsening chronic blood loss anemia.  Packing placed tonight with improvement in symptoms.  Patient had a transient vasovagal episode improved with fluids.  He has been up without difficulty.  Patient will need to have his labs rechecked in the next week.  He does not appear to  require admission for transfusion at this time.  Now the bleeding is controlled, patient feels better and is ready for discharge.  Home with ENT follow-up, Augmentin.    ED Discharge Orders        Ordered    hydrOXYzine (ATARAX/VISTARIL) 25 MG tablet  Every 6 hours     08/17/17 0042    amoxicillin-clavulanate (AUGMENTIN) 875-125 MG tablet  Every 12 hours     08/17/17 0042       Carlisle Cater, PA-C 08/17/17 0046    Little, Wenda Overland, MD 08/17/17 [phone]

## 2017-08-16 NOTE — ED Notes (Signed)
States, "feel better", calmer, NAD, resting comfortably. VSS/improved. Denies nausea.

## 2017-08-16 NOTE — ED Triage Notes (Signed)
Pt presents with c/o nosebleed from right nostril that started around 4 pm today. Pt states he had it cauterized last week . Pt states he bent over and then bleed started.

## 2017-08-17 ENCOUNTER — Other Ambulatory Visit: Payer: Self-pay | Admitting: Internal Medicine

## 2017-08-17 LAB — BASIC METABOLIC PANEL
Anion gap: 7 (ref 5–15)
BUN: 17 mg/dL (ref 6–20)
CHLORIDE: 105 mmol/L (ref 101–111)
CO2: 26 mmol/L (ref 22–32)
Calcium: 8.6 mg/dL — ABNORMAL LOW (ref 8.9–10.3)
Creatinine, Ser: 0.6 mg/dL — ABNORMAL LOW (ref 0.61–1.24)
GFR calc non Af Amer: >60 mL/min (ref >60)
Glucose, Bld: 120 mg/dL — ABNORMAL HIGH (ref 65–99)
POTASSIUM: 3.3 mmol/L — AB (ref 3.5–5.1)
SODIUM: 138 mmol/L (ref 135–145)

## 2017-08-17 MED ORDER — AMOXICILLIN-POT CLAVULANATE 875-125 MG PO TABS: 1.0000 | ORAL_TABLET | Freq: Two times a day (BID) | ORAL | 0 refills | Status: DC

## 2017-08-17 MED ORDER — HYDROXYZINE HCL 25 MG PO TABS: 25.0000 mg | ORAL_TABLET | Freq: Four times a day (QID) | ORAL | 0 refills | Status: DC

## 2017-08-17 NOTE — ED Notes (Signed)
Up to b/r, steady gait, "feels better", denies nausea or dizziness.

## 2017-08-17 NOTE — Discharge Instructions (Signed)
Please read and follow all provided instructions.  Your diagnoses today include:  1. Right-sided epistaxis   2. Chronic blood loss anemia     Tests performed today include:  Hemoglobin - 9.2 tonight, slowing becoming lower over the past several weeks  Vital signs. See below for your results today.   Medications prescribed:   Augmentin - antibiotic  You have been prescribed an antibiotic medicine: take the entire course of medicine even if you are feeling better. Stopping early can cause the antibiotic not to work.   Hydroxyzine - antihistamine  You can find this medication over-the-counter.   This medication will make you drowsy. DO NOT drive or perform any activities that require you to be awake and alert if taking this.  Take any prescribed medications only as directed.  Home care instructions:  Follow any educational materials contained in this packet.  Follow-up instructions: Please follow-up with ENT next week for recheck and packing removal.  Return instructions:   Please return to the Emergency Department if you experience worsening symptoms.   Return if bleeding resumes, you develop lightheadedness or pass out, feel short of breath.  Please return if you have any other emergent concerns.  Additional Information:  Your vital signs today were: BP 137/78    Pulse 64    Temp 97.9 F (36.6 C) (Axillary)    Resp 19    SpO2 97%  If your blood pressure (BP) was elevated above 135/85 this visit, please have this repeated by your doctor within one month. --------------

## 2017-08-19 ENCOUNTER — Encounter: Payer: Self-pay | Admitting: Internal Medicine

## 2017-08-21 DIAGNOSIS — R040 Epistaxis: Secondary | ICD-10-CM | POA: Diagnosis not present

## 2017-08-25 ENCOUNTER — Encounter: Payer: Self-pay | Admitting: Internal Medicine

## 2017-08-27 ENCOUNTER — Ambulatory Visit: Payer: BLUE CROSS/BLUE SHIELD | Admitting: Oncology

## 2017-08-27 ENCOUNTER — Other Ambulatory Visit (HOSPITAL_BASED_OUTPATIENT_CLINIC_OR_DEPARTMENT_OTHER): Payer: BLUE CROSS/BLUE SHIELD

## 2017-08-27 VITALS — BP 122/75 | HR 68 | Temp 97.9°F | Resp 18 | Ht 72.0 in | Wt 244.5 lb

## 2017-08-27 DIAGNOSIS — C61 Malignant neoplasm of prostate: Secondary | ICD-10-CM

## 2017-08-27 LAB — CBC WITH DIFFERENTIAL/PLATELET
BASO%: 0.5 % (ref 0.0–2.0)
Basophils Absolute: 0 10*3/uL (ref 0.0–0.1)
EOS%: 1.9 % (ref 0.0–7.0)
Eosinophils Absolute: 0.1 10*3/uL (ref 0.0–0.5)
HEMATOCRIT: 29.9 % — AB (ref 38.4–49.9)
HGB: 9.7 g/dL — ABNORMAL LOW (ref 13.0–17.1)
LYMPH#: 1.7 10*3/uL (ref 0.9–3.3)
LYMPH%: 22.9 % (ref 14.0–49.0)
MCH: 28 pg (ref 27.2–33.4)
MCHC: 32.4 g/dL (ref 32.0–36.0)
MCV: 86.4 fL (ref 79.3–98.0)
MONO#: 0.5 10*3/uL (ref 0.1–0.9)
MONO%: 7 % (ref 0.0–14.0)
NEUT#: 5.1 10*3/uL (ref 1.5–6.5)
NEUT%: 67.7 % (ref 39.0–75.0)
Platelets: 332 10*3/uL (ref 140–400)
RBC: 3.46 10*6/uL — AB (ref 4.20–5.82)
RDW: 14.3 % (ref 11.0–14.6)
WBC: 7.5 10*3/uL (ref 4.0–10.3)

## 2017-08-27 LAB — COMPREHENSIVE METABOLIC PANEL
ALBUMIN: 4 g/dL (ref 3.5–5.0)
ALK PHOS: 91 U/L (ref 40–150)
ALT: 23 U/L (ref 0–55)
ANION GAP: 9 meq/L (ref 3–11)
AST: 16 U/L (ref 5–34)
BILIRUBIN TOTAL: 0.35 mg/dL (ref 0.20–1.20)
BUN: 10.5 mg/dL (ref 7.0–26.0)
CO2: 26 mEq/L (ref 22–29)
Calcium: 9.4 mg/dL (ref 8.4–10.4)
Chloride: 105 mEq/L (ref 98–109)
Creatinine: 0.8 mg/dL (ref 0.7–1.3)
GLUCOSE: 102 mg/dL (ref 70–140)
Potassium: 3.8 mEq/L (ref 3.5–5.1)
SODIUM: 140 meq/L (ref 136–145)
TOTAL PROTEIN: 8 g/dL (ref 6.4–8.3)

## 2017-08-27 NOTE — Progress Notes (Signed)
Hematology and Oncology Follow Up Visit  [name] [id number] [birth year] 61 y.o. 08/27/2017 1:20 PM Colon Branch, MDPaz, [name], MD   Principle Diagnosis:  61 year old gentleman diagnosed with prostate cancer in October 2016. His PSA was 60.98 and a Gleason score 4+5 = 9. He has biochemically recurrent advanced disease.   Prior Therapy:   He underwent robotic-assisted laparoscopic radical prostatectomy and bilateral pelvic lymphadenectomy on 11/24/2015. The final pathology revealed a prostate adenocarcinoma Gleason score 4+5 = 9 with extraprostatic extension is identified at the right anterior apex and mid, bilateral posterior from the apex and bladder neck area. Bilateral seminal vesicle infiltration was identified. Lymphovascular invasion is identified. Margins of resections were positive. One positive lymph node was identified out of a 9 examined. The final pathological staging was T3b N1.  His follow-up PSA remained elevated with a PSA on 03/07/2016 was 3.88.  Current therapy:  Androgen deprivation under the care of Dr. Tresa [name]. Zytiga 1000 mg daily with prednisone 5 mg daily started in July 2017.  Interim History: [name] presents today for a follow-up visit. Since the last visit, he reports recurrent nosebleeds that required emergency department visit as well as nasal packing.  The symptoms have resolved at this time without any recent bleeding. He continues to take Zytiga with prednisone without any major complications.  He does not report any nausea or abdominal distention.  He denied any arthralgias, myalgias or fatigue. He denied any prednisone related complications. He is eating well without changes in his weight.  He continues to be active and performs activities of daily living.    He does not report any headaches, blurry vision, syncope or seizures. He does not report any fevers or chills or sweats. He does not report any cough, wheezing or hemoptysis. Does not report any  nausea, vomiting or abdominal pain. Does not report any other satiety or change in his bowel habits. He does not report any frequency, urgency or hematuria. He does not report any dysuria. He does not report any skeletal complaints. Remaining review of systems unremarkable.   Medications: I have reviewed the patient's current medications.  Current Outpatient Medications  Medication Sig Dispense Refill  . amLODipine (NORVASC) 5 MG tablet Take 1 tablet (5 mg total) by mouth daily. 30 tablet 6  . aspirin 81 MG tablet Take 81 mg by mouth daily.    Marland Kitchen atorvastatin (LIPITOR) 20 MG tablet Take 1 tablet (20 mg total) by mouth daily. 90 tablet 1  . carvedilol (COREG) 12.5 MG tablet Take 1 tablet (12.5 mg total) by mouth 2 (two) times daily with a meal. 60 tablet 3  . ferrous sulfate 325 (65 FE) MG tablet Take 1 tablet (325 mg total) 2 (two) times daily with a meal by mouth. 60 tablet 3  . hydrOXYzine (ATARAX/VISTARIL) 25 MG tablet Take 1 tablet (25 mg total) every 6 (six) hours by mouth. 12 tablet 0  . Leuprolide Acetate (LUPRON DEPOT, 18-MONTH, IM) Inject 1 each into the muscle. Every 6 months per Urology    . levothyroxine (SYNTHROID, LEVOTHROID) 50 MCG tablet Take 1 tablet (50 mcg total) daily before breakfast by mouth. 90 tablet 0  . lisinopril-hydrochlorothiazide (PRINZIDE,ZESTORETIC) 20-12.5 MG tablet Take 2 tablets by mouth daily. 60 tablet 1  . meloxicam (MOBIC) 7.5 MG tablet take 1 tablet by mouth once daily 30 tablet 0  . nitroGLYCERIN (NITROSTAT) 0.4 MG SL tablet Place 1 tablet (0.4 mg total) under the tongue every 5 (five) minutes as needed  for chest pain. 25 tablet 3  . oxymetazoline (NASAL DECONGESTANT SPRAY) 0.05 % nasal spray As directed    . predniSONE (DELTASONE) 5 MG tablet take 1 tablet by mouth once daily WITH BREAKFAST. 30 tablet 3  . sertraline (ZOLOFT) 25 MG tablet Take 1 tablet (25 mg total) by mouth daily. 90 tablet 1  . sodium chloride (OCEAN) 0.65 % SOLN nasal spray Place 1 spray  into both nostrils as needed for congestion.    Marland Kitchen ZYTIGA 250 MG tablet TAKE 4 TABLETS (1000MG) BY MOUTH EVERY DAY ON AN EMPTY STOMACH 1 HOUR BEFORE OR 2 HOURS AFTER A MEAL 120 tablet 0   No current facility-administered medications for this visit.      Allergies: No Known Allergies  Past Medical History, Surgical history, Social history, and Family History were reviewed and updated.   Physical Exam: Blood pressure 122/75, pulse 68, temperature 97.9 F (36.6 C), temperature source Oral, resp. rate 18, height 6' (1.829 m), weight 244 lb 8 oz (110.9 kg), SpO2 98 %. ECOG: 0 General appearance: Alert, awake gentleman without distress.. Head: Normocephalic, without obvious abnormality no oral ulcers or lesions. Neck: no adenopathy Lymph nodes: Cervical, supraclavicular, and axillary nodes normal. Heart:regular rate and rhythm, S1, S2 normal, no murmur, click, rub or gallop Lung:chest clear, no wheezing, rales, normal symmetric air entry Abdomin: soft, non-tender, without masses or organomegaly no shifting dullness or ascites. EXT:no erythema, induration, or nodules   Lab Results: Lab Results  Component Value Date   WBC 7.5 08/27/2017   HGB 9.7 (L) 08/27/2017   HCT 29.9 (L) 08/27/2017   MCV 86.4 08/27/2017   PLT 332 08/27/2017     Chemistry      Component Value Date/Time   NA 138 08/16/2017 2242   NA 140 05/29/2017 1256   K 3.3 (L) 08/16/2017 2242   K 3.8 05/29/2017 1256   CL 105 08/16/2017 2242   CO2 26 08/16/2017 2242   CO2 26 05/29/2017 1256   BUN 17 08/16/2017 2242   BUN 12.0 05/29/2017 1256   CREATININE 0.60 (L) 08/16/2017 2242   CREATININE 0.8 05/29/2017 1256      Component Value Date/Time   CALCIUM 8.6 (L) 08/16/2017 2242   CALCIUM 9.7 05/29/2017 1256   ALKPHOS 102 05/29/2017 1256   AST 19 05/29/2017 1256   ALT 27 05/29/2017 1256   BILITOT 0.63 05/29/2017 1256       Results for DONNELLE, OLMEDA "TIM" (MRN [id number]) as of 08/27/2017 13:07  Ref. Range  10/11/2016 12:49 01/30/2017 12:07 05/29/2017 12:56 07/03/2017 00:00  PSA Unknown    <0.015  Prostate Specific Ag, Serum Latest Ref Range: 0.0 - 4.0 ng/mL <0.1 <0.1 <0.1      Impression and Plan:  61 year old gentleman with the following issues:  1. The prostate cancer diagnosed in October 2016. His PSA was 60.98 and a Gleason score of 4+5 = 9. He is status post radical prostatectomy on 11/24/2015 for locally advanced disease with his disease showed positive margins and extraprostatic extension. He had positive lymph node as well as persistent elevation in his PSA. His follow-up PSA in June 2017 showed elevated PSA of 3.88.  He is currently receiving androgen deprivation therapy in addition to [facility] without complications.   His PSA continues to be low and undetectable at this time.  The plan is to continue with androgen deprivation therapy only and hold Zytiga for the time being.  He would like to have a treatment break for at least  3 months and will reevaluate that in 3 months at the time.  2. Androgen depravation: This is administered under the care of Dr. Tresa [name] which I recommended to continue indefinitely.  3. Hypertension: His blood pressure appears to be manageable at this time without changes.  4. Hypokalemia: His potassium remains adequate at this time.  5. LFT monitoring: Liver function remain normal and will be repeated today.  6.  Epistaxis: Unrelated to Zytiga and his prostate cancer.  No evidence of recurrence at this time and he will follow with the ENT regarding this issue if it occurs in the future.  7. Follow-up: Will be in 3 months.      Zola Button, MD 11/21/20181:20 PM

## 2017-08-28 LAB — PSA

## 2017-08-29 ENCOUNTER — Telehealth: Payer: Self-pay | Admitting: *Deleted

## 2017-08-29 NOTE — Telephone Encounter (Signed)
LOM on answering machine of last PSA result

## 2017-08-29 NOTE — Telephone Encounter (Signed)
-----   Message from Wyatt Portela, MD sent at 08/29/2017  9:14 AM EST ----- Please let him know his PSA is very low

## 2017-09-08 ENCOUNTER — Encounter: Payer: Self-pay | Admitting: Internal Medicine

## 2017-09-10 ENCOUNTER — Telehealth: Payer: Self-pay | Admitting: Oncology

## 2017-09-10 NOTE — Telephone Encounter (Signed)
Left msg re appt.  Schedule mailed

## 2017-09-11 ENCOUNTER — Other Ambulatory Visit: Payer: Self-pay | Admitting: Oncology

## 2017-09-11 DIAGNOSIS — C61 Malignant neoplasm of prostate: Secondary | ICD-10-CM

## 2017-09-14 ENCOUNTER — Other Ambulatory Visit: Payer: Self-pay | Admitting: Oncology

## 2017-09-14 DIAGNOSIS — C61 Malignant neoplasm of prostate: Secondary | ICD-10-CM

## 2017-09-22 ENCOUNTER — Other Ambulatory Visit: Payer: Self-pay | Admitting: Internal Medicine

## 2017-10-14 ENCOUNTER — Other Ambulatory Visit (INDEPENDENT_AMBULATORY_CARE_PROVIDER_SITE_OTHER): Payer: BLUE CROSS/BLUE SHIELD

## 2017-10-14 ENCOUNTER — Other Ambulatory Visit: Payer: BLUE CROSS/BLUE SHIELD

## 2017-10-14 DIAGNOSIS — D649 Anemia, unspecified: Secondary | ICD-10-CM | POA: Diagnosis not present

## 2017-10-14 DIAGNOSIS — R040 Epistaxis: Secondary | ICD-10-CM

## 2017-10-14 LAB — CBC WITH DIFFERENTIAL/PLATELET
BASOS ABS: 0.1 10*3/uL (ref 0.0–0.1)
Basophils Relative: 0.9 % (ref 0.0–3.0)
Eosinophils Absolute: 0.2 10*3/uL (ref 0.0–0.7)
Eosinophils Relative: 3.9 % (ref 0.0–5.0)
HEMATOCRIT: 40.9 % (ref 39.0–52.0)
Hemoglobin: 13.2 g/dL (ref 13.0–17.0)
LYMPHS PCT: 43.6 % (ref 12.0–46.0)
Lymphs Abs: 2.6 10*3/uL (ref 0.7–4.0)
MCHC: 32.2 g/dL (ref 30.0–36.0)
MCV: 82.3 fl (ref 78.0–100.0)
MONOS PCT: 6.9 % (ref 3.0–12.0)
Monocytes Absolute: 0.4 10*3/uL (ref 0.1–1.0)
NEUTROS PCT: 44.7 % (ref 43.0–77.0)
Neutro Abs: 2.7 10*3/uL (ref 1.4–7.7)
Platelets: 200 10*3/uL (ref 150.0–400.0)
RBC: 4.97 Mil/uL (ref 4.22–5.81)
RDW: 15.5 % (ref 11.5–15.5)
WBC: 6.1 10*3/uL (ref 4.0–10.5)

## 2017-10-15 ENCOUNTER — Encounter: Payer: Self-pay | Admitting: Internal Medicine

## 2017-10-15 MED ORDER — AMLODIPINE BESYLATE 5 MG PO TABS: 5.0000 mg | ORAL_TABLET | Freq: Every day | ORAL | 1 refills | Status: DC

## 2017-10-15 MED ORDER — CARVEDILOL 12.5 MG PO TABS: 12.5000 mg | ORAL_TABLET | Freq: Two times a day (BID) | ORAL | 1 refills | Status: DC

## 2017-10-15 MED ORDER — LISINOPRIL-HYDROCHLOROTHIAZIDE 20-12.5 MG PO TABS: 2.0000 | ORAL_TABLET | Freq: Every day | ORAL | 1 refills | Status: DC

## 2017-10-25 ENCOUNTER — Other Ambulatory Visit: Payer: Self-pay | Admitting: Internal Medicine

## 2017-10-25 DIAGNOSIS — Z0000 Encounter for general adult medical examination without abnormal findings: Secondary | ICD-10-CM

## 2017-12-04 ENCOUNTER — Other Ambulatory Visit: Payer: Self-pay | Admitting: *Deleted

## 2017-12-04 ENCOUNTER — Inpatient Hospital Stay: Payer: BLUE CROSS/BLUE SHIELD

## 2017-12-04 ENCOUNTER — Telehealth: Payer: Self-pay

## 2017-12-04 ENCOUNTER — Inpatient Hospital Stay: Payer: BLUE CROSS/BLUE SHIELD | Attending: Oncology | Admitting: Oncology

## 2017-12-04 VITALS — BP 123/82 | HR 55 | Temp 98.6°F | Resp 17 | Ht 72.0 in | Wt 255.5 lb

## 2017-12-04 DIAGNOSIS — I10 Essential (primary) hypertension: Secondary | ICD-10-CM | POA: Insufficient documentation

## 2017-12-04 DIAGNOSIS — E876 Hypokalemia: Secondary | ICD-10-CM | POA: Insufficient documentation

## 2017-12-04 DIAGNOSIS — C61 Malignant neoplasm of prostate: Secondary | ICD-10-CM | POA: Insufficient documentation

## 2017-12-04 DIAGNOSIS — E291 Testicular hypofunction: Secondary | ICD-10-CM | POA: Insufficient documentation

## 2017-12-04 LAB — COMPREHENSIVE METABOLIC PANEL
ALT: 59 U/L — ABNORMAL HIGH (ref 0–55)
AST: 30 U/L (ref 5–34)
Albumin: 3.9 g/dL (ref 3.5–5.0)
Alkaline Phosphatase: 103 U/L (ref 40–150)
Anion gap: 10 (ref 3–11)
BILIRUBIN TOTAL: 0.4 mg/dL (ref 0.2–1.2)
BUN: 13 mg/dL (ref 7–26)
CHLORIDE: 106 mmol/L (ref 98–109)
CO2: 25 mmol/L (ref 22–29)
Calcium: 9.8 mg/dL (ref 8.4–10.4)
Creatinine, Ser: 0.76 mg/dL (ref 0.70–1.30)
Glucose, Bld: 113 mg/dL (ref 70–140)
POTASSIUM: 4.1 mmol/L (ref 3.5–5.1)
Sodium: 141 mmol/L (ref 136–145)
TOTAL PROTEIN: 7.8 g/dL (ref 6.4–8.3)

## 2017-12-04 LAB — CBC WITH DIFFERENTIAL/PLATELET
BASOS ABS: 0 10*3/uL (ref 0.0–0.1)
Basophils Relative: 0 %
EOS ABS: 0.2 10*3/uL (ref 0.0–0.5)
Eosinophils Relative: 3 %
HCT: 41 % (ref 38.4–49.9)
HEMOGLOBIN: 13.7 g/dL (ref 13.0–17.1)
LYMPHS PCT: 39 %
Lymphs Abs: 2 10*3/uL (ref 0.9–3.3)
MCH: 25.7 pg — ABNORMAL LOW (ref 27.2–33.4)
MCHC: 33.3 g/dL (ref 32.0–36.0)
MCV: 77.1 fL — AB (ref 79.3–98.0)
Monocytes Absolute: 0.3 10*3/uL (ref 0.1–0.9)
Monocytes Relative: 7 %
NEUTROS PCT: 51 %
Neutro Abs: 2.6 10*3/uL (ref 1.5–6.5)
PLATELETS: 153 10*3/uL (ref 140–400)
RBC: 5.32 MIL/uL (ref 4.20–5.82)
RDW: 16.5 % — ABNORMAL HIGH (ref 11.0–14.6)
WBC: 5.1 10*3/uL (ref 4.0–10.3)

## 2017-12-04 MED ORDER — PREDNISONE 5 MG PO TABS: 5.0000 mg | ORAL_TABLET | Freq: Every day | ORAL | 3 refills | Status: DC

## 2017-12-04 MED ORDER — ABIRATERONE ACETATE 250 MG PO TABS: 1000.0000 mg | ORAL_TABLET | Freq: Every day | ORAL | 0 refills | Status: DC

## 2017-12-04 NOTE — Telephone Encounter (Signed)
Printed avs and calender of upcoming appointment. per 2/28 los

## 2017-12-04 NOTE — Progress Notes (Signed)
Hematology and Oncology Follow Up Visit  Gary [name] [id number] 06-Oct-1956 62 y.o. 12/04/2017 8:23 AM Colon Branch, MDPaz, [name], MD   Principle Diagnosis:  62 year old man diagnosed with prostate cancer in October 2016.  He has biochemical relapse with advanced disease after initially presenting with PSA of 60.98 and a Gleason score 4+5 = 9.    Prior Therapy:   He is status post robotic-assisted laparoscopic radical prostatectomy and bilateral pelvic lymphadenectomy on 11/24/2015. One positive lymph node was identified out of a 9 examined. The final pathological staging was T3b N1.  His follow-up PSA remained elevated with a PSA on 03/07/2016 was 3.88.  Current therapy:  Androgen deprivation under the care of Dr. Tresa [name]. Zytiga 1000 mg daily with prednisone 5 mg daily started in July 2017.  Interim History: [name] is here for a follow-up.  He reports no major changes in his health since last visit.  He interrupted Zytiga and prednisone and has been on a treatment break since the last visit for 3 months.  Reports he felt well with slight improvement in his overall activity level and performance status.  He denies any nausea or changes in his bowel habits.  His appetite is improved and gained weight.  He denies any back pain or shoulder pain.  He denies any pathological fractures.  He denies any falls or syncope.   He does not report any headaches, blurry vision, syncope or seizures. He does not report any fevers or chills or sweats. He does not report any cough, wheezing or hemoptysis. Does not report any nausea, vomiting or abdominal pain. He does not report any frequency, urgency or hematuria. He does not report any dysuria. He does not report any skeletal complaints.  He does not report any anxiety or depression.  He does not report any lymphadenopathy or petechiae.  He does not report any skin rashes or lesions.  Remaining review of systems is negative.   Medications: I have  reviewed the patient's current medications.  Current Outpatient Medications  Medication Sig Dispense Refill  . amLODipine (NORVASC) 5 MG tablet Take 1 tablet (5 mg total) by mouth daily. 90 tablet 1  . aspirin 81 MG tablet Take 81 mg by mouth daily.    Marland Kitchen atorvastatin (LIPITOR) 20 MG tablet Take 1 tablet (20 mg total) by mouth daily. 90 tablet 1  . carvedilol (COREG) 12.5 MG tablet Take 1 tablet (12.5 mg total) by mouth 2 (two) times daily with a meal. 180 tablet 1  . ferrous sulfate 325 (65 FE) MG tablet Take 1 tablet (325 mg total) 2 (two) times daily with a meal by mouth. 60 tablet 3  . hydrOXYzine (ATARAX/VISTARIL) 25 MG tablet Take 1 tablet (25 mg total) every 6 (six) hours by mouth. 12 tablet 0  . Leuprolide Acetate (LUPRON DEPOT, 53-MONTH, IM) Inject 1 each into the muscle. Every 6 months per Urology    . levothyroxine (SYNTHROID, LEVOTHROID) 50 MCG tablet Take 1 tablet (50 mcg total) daily before breakfast by mouth. 90 tablet 0  . lisinopril-hydrochlorothiazide (PRINZIDE,ZESTORETIC) 20-12.5 MG tablet Take 2 tablets by mouth daily. 180 tablet 1  . meloxicam (MOBIC) 7.5 MG tablet take 1 tablet by mouth once daily 30 tablet 0  . nitroGLYCERIN (NITROSTAT) 0.4 MG SL tablet Place 1 tablet (0.4 mg total) under the tongue every 5 (five) minutes as needed for chest pain. 25 tablet 3  . oxymetazoline (NASAL DECONGESTANT SPRAY) 0.05 % nasal spray As directed    .  predniSONE (DELTASONE) 5 MG tablet take 1 tablet by mouth once daily WITH BREAKFAST. 30 tablet 3  . sertraline (ZOLOFT) 25 MG tablet Take 1 tablet (25 mg total) by mouth daily. 90 tablet 1  . sodium chloride (OCEAN) 0.65 % SOLN nasal spray Place 1 spray into both nostrils as needed for congestion.    Marland Kitchen ZYTIGA 250 MG tablet TAKE 4 TABLETS (1000MG) BY MOUTH EVERY DAY ON AN EMPTY STOMACH 1 HOUR BEFORE OR 2 HOURS AFTER A MEAL 120 tablet 0  . ZYTIGA 250 MG tablet TAKE 4 TABLETS (1000MG) BY MOUTH EVERY DAY ON AN EMPTY STOMACH 1 HOUR BEFORE OR 2  HOURS AFTER A MEAL 120 tablet 0   No current facility-administered medications for this visit.      Allergies: No Known Allergies  Past Medical History, Surgical history, Social history, and Family History updated today remain unchanged.   Physical Exam: Blood pressure 123/82, pulse (!) 55, temperature 98.6 F (37 C), temperature source Oral, resp. rate 17, height 6' (1.829 m), weight 255 lb 8 oz (115.9 kg), SpO2 97 %.   ECOG: 0 General appearance: Well-appearing gentleman appeared comfortable. Head: Normocephalic, without obvious abnormality  Oropharynx: Mucous membranes are moist and pink. Eyes: No scleral icterus. Lymph nodes: Cervical, supraclavicular, and axillary nodes normal. Heart: Regular rate and rhythm without murmurs rubs or gallops. Lung: Clear to auscultation without rhonchi, wheezes or dullness to percussion. Abdomin: Soft, nontender without any rebound or guarding.  Good bowel sounds. Musculoskeletal: No joint deformity or effusion.  Full range of motion noted. Skin: No rashes or lesions. Neurological: No motor, sensory deficits.   Lab Results: Lab Results  Component Value Date   WBC 6.1 10/14/2017   HGB 13.2 10/14/2017   HCT 40.9 10/14/2017   MCV 82.3 10/14/2017   PLT 200.0 10/14/2017     Chemistry      Component Value Date/Time   NA 140 08/27/2017 1259   K 3.8 08/27/2017 1259   CL 105 08/16/2017 2242   CO2 26 08/27/2017 1259   BUN 10.5 08/27/2017 1259   CREATININE 0.8 08/27/2017 1259      Component Value Date/Time   CALCIUM 9.4 08/27/2017 1259   ALKPHOS 91 08/27/2017 1259   AST 16 08/27/2017 1259   ALT 23 08/27/2017 1259   BILITOT 0.35 08/27/2017 1259       Results for Gary, [name] "TIM" (MRN [id number]) as of 12/04/2017 08:19  Ref. Range 08/27/2017 12:59  Prostate Specific Ag, Serum Latest Ref Range: 0.0 - 4.0 ng/mL <0.1      Impression and Plan:  62 year old gentleman with the following issues:  1.  Advanced prostate cancer  diagnosed in October 2016.  He is status post radical prostatectomy with persistent elevation in his PSA with positive lymph node involvement.  He was started on androgen deprivation and Zytiga was added in July 2017.  He had excellent response in his PSA that has been undetectable declining from 3.88 after prostatectomy.  [place].  The rationale for restarting Zytiga and prednisone was discussed today in detail.  Despite no measurable disease, he has very high risk prostate cancer with persistent elevation post prostatectomy indicating advanced disease at this time.  Androgen deprivation therapy alone would be an option but if there is clear benefit of adding Zytiga to this regimen for long-term overall survival as well as metastatic free survival.  This has been elucidated in multiple clinical trials.  After discussion today, and weighing the risks and benefits of restarting Zytiga he is agreeable to resume it.  He will receive it as 1000 mg daily with prednisone at 5 mg daily.  2. Androgen depravation: The rationale for continuing Lupron indefinitely was discussed.  He will continue receiving that at [facility] urology.  3. Hypertension: Blood pressure is within normal range and has not been affected in the past with Zytiga.  4. Hypokalemia: Potassium will be checked periodically on Zytiga.  Last potassium was 3.8 and will be repeated today.  5. LFT monitoring: Liver function tests continue to be within normal range and will continue to be checked periodically.  6. Follow-up: Will be in 3 months.   25  minutes was spent with the patient face-to-face today.  More than 50% of time was dedicated to patient counseling, education and coordination of the patient's multifaceted care.    Zola Button, MD 2/28/20198:23 AM

## 2017-12-05 ENCOUNTER — Telehealth: Payer: Self-pay | Admitting: *Deleted

## 2017-12-05 ENCOUNTER — Encounter: Payer: Self-pay | Admitting: Internal Medicine

## 2017-12-05 LAB — PROSTATE-SPECIFIC AG, SERUM (LABCORP): Prostate Specific Ag, Serum: <0.1 ng/mL (ref 0.0–4.0)

## 2017-12-05 NOTE — Telephone Encounter (Signed)
-----   Message from Gary Portela, MD sent at 12/05/2017  8:03 AM EST ----- Please let him know his PSA still low

## 2017-12-05 NOTE — Telephone Encounter (Signed)
LM on AM of last PSA

## 2017-12-09 ENCOUNTER — Ambulatory Visit: Payer: BLUE CROSS/BLUE SHIELD | Admitting: Internal Medicine

## 2017-12-09 ENCOUNTER — Encounter: Payer: Self-pay | Admitting: Internal Medicine

## 2017-12-09 VITALS — BP 128/82 | HR 53 | Temp 98.1°F | Resp 16 | Ht 72.0 in | Wt 254.4 lb

## 2017-12-09 DIAGNOSIS — E039 Hypothyroidism, unspecified: Secondary | ICD-10-CM

## 2017-12-09 DIAGNOSIS — I10 Essential (primary) hypertension: Secondary | ICD-10-CM

## 2017-12-09 DIAGNOSIS — E785 Hyperlipidemia, unspecified: Secondary | ICD-10-CM | POA: Diagnosis not present

## 2017-12-09 NOTE — Progress Notes (Signed)
Subjective:    Patient ID: Gary [name], male    DOB: 1955-11-01, 62 y.o.   MRN: [id number]  DOS:  12/09/2017 Type of visit - description : rov Interval history: Good compliance with medications No major concerns Ambulatory BPs within normal Recent labs reviewed   Review of Systems Denies chest pain or difficulty breathing No nausea, vomiting, diarrhea  Past Medical History:  Diagnosis Date  . Anxiety   . CAD (coronary artery disease) 10-2012   h/o stents   . Elevated PSA    Prostate Bx w/ Dr. Tresa [name]  . EtOH dependence ([place])   . Family history of adverse reaction to anesthesia    made father disoriented  . Gross hematuria   . Hepatic steatosis   . Hyperlipidemia LDL goal < 70   . Hypertension   . Metastatic adenocarcinoma to prostate ([place]) 2017   Gleason score 9, metastasis to R external Iliac node and large extraprostatic extension at prostatectomy  . Multiple sclerosis ([place]) 1991   Bilateral optic neuritis, treated and resolved but felt to be a manifestation of MS.  . Renal cyst, left    Bosniak 2K (indeterminant) hyperdense cyst CT 2014, Stable CT in 2016  . Ulcerative colitis [facility])     Past Surgical History:  Procedure Laterality Date  . FOOT SURGERY     R toe Fx (screws),  L dorsal FX (screws)  . LEFT HEART CATHETERIZATION WITH CORONARY ANGIOGRAM N/A 10/22/2012   Procedure: LEFT HEART CATHETERIZATION WITH CORONARY ANGIOGRAM;  Surgeon: Sinclair Grooms, MD;  Location: [facility] CATH LAB;  Service: Cardiovascular;  Laterality: N/A;  . LYMPHADENECTOMY Bilateral 11/24/2015   Procedure: BILATERAL PELVIC LYMPHADENECTOMY;  Surgeon: Alexis Frock, MD;  Location: [facility] ORS;  Service: Urology;  Laterality: Bilateral;  . PERCUTANEOUS CORONARY STENT INTERVENTION (PCI-S) N/A 10/23/2012   Procedure: PERCUTANEOUS CORONARY STENT INTERVENTION (PCI-S);  Surgeon: Sinclair Grooms, MD;  Location: [facility] CATH LAB;  Service: Cardiovascular;  Laterality: N/A;  . PROSTATE BIOPSY  09/2015   Dr.  Tresa [name]  . ROBOT ASSISTED LAPAROSCOPIC RADICAL PROSTATECTOMY N/A 11/24/2015   Procedure: XI ROBOTIC ASSISTED LAPAROSCOPIC RADICAL PROSTATECTOMY WITH INDOCYANINE GREEN DYE;  Surgeon: Alexis Frock, MD;  Location: [facility] ORS;  Service: Urology;  Laterality: N/A;  . VASECTOMY      Social History   Socioeconomic History  . Marital status: Married    Spouse name: Not on file  . Number of children: 1  . Years of education: Not on file  . Highest education level: Not on file  Social Needs  . Financial resource strain: Not on file  . Food insecurity - worry: Not on file  . Food insecurity - inability: Not on file  . Transportation needs - medical: Not on file  . Transportation needs - non-medical: Not on file  Occupational History  . Occupation: Actuary: [employer]  Tobacco Use  . Smoking status: Former Smoker    Last attempt to quit: 07/18/1990    Years since quitting: 27.4  . Smokeless tobacco: Former Systems developer  . Tobacco comment: 1991  Substance and Sexual Activity  . Alcohol use: Yes    Comment: weekly  . Drug use: Yes    Types: Marijuana  . Sexual activity: Not on file  Other Topics Concern  . Not on file  Social History Narrative   Household-- pt and wife   Has an adult son      Allergies as of 12/09/2017  No Known Allergies     Medication List        Accurate as of 12/09/17  5:30 PM. Always use your most recent med list.          abiraterone acetate 250 MG tablet Commonly known as:  ZYTIGA Take 4 tablets (1,000 mg total) by mouth daily. Take on an empty stomach 1 hour before or 2 hours after a meal   amLODipine 5 MG tablet Commonly known as:  NORVASC Take 1 tablet (5 mg total) by mouth daily.   aspirin 81 MG tablet Take 81 mg by mouth daily.   atorvastatin 20 MG tablet Commonly known as:  LIPITOR Take 1 tablet (20 mg total) by mouth daily.   carvedilol 12.5 MG tablet Commonly known as:  COREG Take 1 tablet (12.5 mg total) by mouth 2 (two) times  daily with a meal.   ferrous sulfate 325 (65 FE) MG tablet Take 1 tablet (325 mg total) 2 (two) times daily with a meal by mouth.   hydrOXYzine 25 MG tablet Commonly known as:  ATARAX/VISTARIL Take 1 tablet (25 mg total) every 6 (six) hours by mouth.   levothyroxine 50 MCG tablet Commonly known as:  SYNTHROID, LEVOTHROID Take 1 tablet (50 mcg total) daily before breakfast by mouth.   lisinopril-hydrochlorothiazide 20-12.5 MG tablet Commonly known as:  PRINZIDE,ZESTORETIC Take 2 tablets by mouth daily.   LUPRON DEPOT (35-MONTH) IM Inject 1 each into the muscle. Every 6 months per Urology   meloxicam 7.5 MG tablet Commonly known as:  MOBIC take 1 tablet by mouth once daily   NASAL DECONGESTANT SPRAY 0.05 % nasal spray Generic drug:  oxymetazoline As directed   nitroGLYCERIN 0.4 MG SL tablet Commonly known as:  NITROSTAT Place 1 tablet (0.4 mg total) under the tongue every 5 (five) minutes as needed for chest pain.   predniSONE 5 MG tablet Commonly known as:  DELTASONE Take 1 tablet (5 mg total) by mouth daily with breakfast.   sertraline 25 MG tablet Commonly known as:  ZOLOFT Take 1 tablet (25 mg total) by mouth daily.   sodium chloride 0.65 % Soln nasal spray Commonly known as:  OCEAN Place 1 spray into both nostrils as needed for congestion.          Objective:   Physical Exam BP 128/82 (BP Location: Left Arm, Patient Position: Sitting, Cuff Size: Normal)   Pulse (!) 53   Temp 98.1 F (36.7 C) (Oral)   Resp 16   Ht 6' (1.829 m)   Wt 254 lb 6 oz (115.4 kg)   SpO2 97%   BMI 34.50 kg/m  General:   Well developed, well nourished . NAD.  HEENT:  Normocephalic . Face symmetric, atraumatic Lungs:  CTA B Normal respiratory effort, no intercostal retractions, no accessory muscle use. Heart: RRR,  no murmur.  No pretibial edema bilaterally  Skin: Not pale. Not jaundice Neurologic:  alert & oriented X3.  Speech normal, gait appropriate for age and  unassisted Psych--  Cognition and judgment appear intact.  Cooperative with normal attention span and concentration.  Behavior appropriate. No anxious or depressed appearing.      Assessment & Plan:    Assessment  Pre-diabetes HTN Hyperlipidemia Anxiety CAD s/p stents 2014  EtOH dependency Ulcerative colitis- last cscope 05-2015 GU: -Metastatic prostate cancer, DX 11-2015, radical prostatectomy 11-2015 --  continent as off  07-2016--on androgen depravation  -Hematuria, saw urology 01-2013 reviewed: CT show a renal cyst, cystoscopy negative, they recommended  follow-up MRI for the renal cyst. MS  Dx 1991 (B neuritis, rx, resolved, felt to be d/t MS) Bilateral thenar atrophy noted 06-2017 (apparently chronic) Nose bleed: R nostril artery cauterize 08-2017  PLAN HTN: Seems well controlled on amlodipine, carvedilol, Zestoretic.  Last BMP satisfactory Hyperlipidemia: On Lipitor, controlled. CAD: Asx Hypothyroidism: We will come back for a TSH next week (declined today) , good medication compliance. Metastatic prostate cancer: Last PSA undetectable, that is great news. RTC 08-2018 CPX

## 2017-12-09 NOTE — Progress Notes (Signed)
Pre visit review using our clinic review tool, if applicable. No additional management support is needed unless otherwise documented below in the visit note. 

## 2017-12-09 NOTE — Assessment & Plan Note (Signed)
HTN: Seems well controlled on amlodipine, carvedilol, Zestoretic.  Last BMP satisfactory Hyperlipidemia: On Lipitor, controlled. CAD: Asx Hypothyroidism: We will come back for a TSH next week (declined today) , good medication compliance. Metastatic prostate cancer: Last PSA undetectable, that is great news. RTC 08-2018 CPX

## 2017-12-09 NOTE — Patient Instructions (Signed)
  GO TO THE FRONT DESK  Schedule your labs for next week, no need to fast  Schedule your next appointment for a physical exam by 08-2018

## 2017-12-16 ENCOUNTER — Other Ambulatory Visit (INDEPENDENT_AMBULATORY_CARE_PROVIDER_SITE_OTHER): Payer: BLUE CROSS/BLUE SHIELD

## 2017-12-16 DIAGNOSIS — E039 Hypothyroidism, unspecified: Secondary | ICD-10-CM | POA: Diagnosis not present

## 2017-12-16 LAB — TSH: TSH: 5.77 u[IU]/mL — ABNORMAL HIGH (ref 0.35–4.50)

## 2017-12-18 MED ORDER — LEVOTHYROXINE SODIUM 88 MCG PO TABS: 88.0000 ug | ORAL_TABLET | Freq: Every day | ORAL | 1 refills | Status: DC

## 2017-12-18 NOTE — Addendum Note (Signed)
Addended byDamita Dunnings D on: 12/18/2017 05:06 PM   Modules accepted: Orders

## 2017-12-31 ENCOUNTER — Other Ambulatory Visit: Payer: Self-pay | Admitting: Internal Medicine

## 2018-01-06 ENCOUNTER — Other Ambulatory Visit: Payer: Self-pay | Admitting: *Deleted

## 2018-01-06 ENCOUNTER — Other Ambulatory Visit: Payer: Self-pay | Admitting: Internal Medicine

## 2018-01-06 DIAGNOSIS — C61 Malignant neoplasm of prostate: Secondary | ICD-10-CM

## 2018-01-06 MED ORDER — ABIRATERONE ACETATE 250 MG PO TABS: 1000.0000 mg | ORAL_TABLET | Freq: Every day | ORAL | 0 refills | Status: DC

## 2018-01-06 MED ORDER — LEVOTHYROXINE SODIUM 88 MCG PO TABS: 88.0000 ug | ORAL_TABLET | Freq: Every day | ORAL | 1 refills | Status: DC

## 2018-01-29 ENCOUNTER — Other Ambulatory Visit: Payer: Self-pay | Admitting: *Deleted

## 2018-01-29 DIAGNOSIS — C61 Malignant neoplasm of prostate: Secondary | ICD-10-CM

## 2018-01-29 MED ORDER — ABIRATERONE ACETATE 250 MG PO TABS: 1000.0000 mg | ORAL_TABLET | Freq: Every day | ORAL | 0 refills | Status: DC

## 2018-02-17 ENCOUNTER — Other Ambulatory Visit: Payer: Self-pay | Admitting: Internal Medicine

## 2018-02-23 DIAGNOSIS — H00021 Hordeolum internum right upper eyelid: Secondary | ICD-10-CM | POA: Diagnosis not present

## 2018-02-25 ENCOUNTER — Encounter: Payer: Self-pay | Admitting: Internal Medicine

## 2018-03-05 ENCOUNTER — Inpatient Hospital Stay: Payer: BLUE CROSS/BLUE SHIELD

## 2018-03-05 ENCOUNTER — Inpatient Hospital Stay: Payer: BLUE CROSS/BLUE SHIELD | Attending: Oncology | Admitting: Oncology

## 2018-03-05 VITALS — BP 136/76 | HR 58 | Temp 98.6°F | Resp 18 | Ht 72.0 in | Wt 255.4 lb

## 2018-03-05 DIAGNOSIS — C61 Malignant neoplasm of prostate: Secondary | ICD-10-CM

## 2018-03-05 DIAGNOSIS — E291 Testicular hypofunction: Secondary | ICD-10-CM | POA: Diagnosis not present

## 2018-03-05 DIAGNOSIS — R748 Abnormal levels of other serum enzymes: Secondary | ICD-10-CM | POA: Insufficient documentation

## 2018-03-05 DIAGNOSIS — I10 Essential (primary) hypertension: Secondary | ICD-10-CM | POA: Insufficient documentation

## 2018-03-05 DIAGNOSIS — E876 Hypokalemia: Secondary | ICD-10-CM | POA: Insufficient documentation

## 2018-03-05 LAB — CMP (CANCER CENTER ONLY)
ALBUMIN: 4.1 g/dL (ref 3.5–5.0)
ALT: 58 U/L — ABNORMAL HIGH (ref 0–55)
ANION GAP: 12 — AB (ref 3–11)
AST: 41 U/L — AB (ref 5–34)
Alkaline Phosphatase: 104 U/L (ref 40–150)
BUN: 10 mg/dL (ref 7–26)
CHLORIDE: 104 mmol/L (ref 98–109)
CO2: 25 mmol/L (ref 22–29)
Calcium: 9.4 mg/dL (ref 8.4–10.4)
Creatinine: 0.78 mg/dL (ref 0.70–1.30)
GFR, Est AFR Am: >60 mL/min (ref >60)
GFR, Estimated: >60 mL/min (ref >60)
GLUCOSE: 113 mg/dL (ref 70–140)
POTASSIUM: 3.3 mmol/L — AB (ref 3.5–5.1)
SODIUM: 141 mmol/L (ref 136–145)
Total Bilirubin: 0.6 mg/dL (ref 0.2–1.2)
Total Protein: 7.8 g/dL (ref 6.4–8.3)

## 2018-03-05 LAB — CBC WITH DIFFERENTIAL (CANCER CENTER ONLY)
BASOS ABS: 0 10*3/uL (ref 0.0–0.1)
BASOS PCT: 1 %
EOS ABS: 0.2 10*3/uL (ref 0.0–0.5)
EOS PCT: 3 %
HEMATOCRIT: 38.8 % (ref 38.4–49.9)
Hemoglobin: 13.3 g/dL (ref 13.0–17.1)
Lymphocytes Relative: 31 %
Lymphs Abs: 1.8 10*3/uL (ref 0.9–3.3)
MCH: 29.1 pg (ref 27.2–33.4)
MCHC: 34.3 g/dL (ref 32.0–36.0)
MCV: 84.9 fL (ref 79.3–98.0)
MONO ABS: 0.4 10*3/uL (ref 0.1–0.9)
Monocytes Relative: 7 %
NEUTROS ABS: 3.5 10*3/uL (ref 1.5–6.5)
Neutrophils Relative %: 58 %
PLATELETS: 187 10*3/uL (ref 140–400)
RBC: 4.57 MIL/uL (ref 4.20–5.82)
RDW: 14.1 % (ref 11.0–14.6)
WBC Count: 5.9 10*3/uL (ref 4.0–10.3)

## 2018-03-05 NOTE — Progress Notes (Signed)
Hematology and Oncology Follow Up Visit  Gary [name] [id number] June 01, 1956 62 y.o. 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  He has biochemical relapse after initial diagnosis with PSA of 60.98 and a Gleason score 4+5 = 9.    Prior Therapy:   He is status post robotic-assisted laparoscopic radical prostatectomy and bilateral pelvic lymphadenectomy on 11/24/2015. One positive lymph node was identified out of a 9 examined. The final pathological staging was T3b N1.  His follow-up PSA remained elevated with a PSA on 03/07/2016 was 3.88.  Current therapy:  Androgen deprivation under the care of Dr. Tresa [name]. Zytiga 1000 mg daily with prednisone 5 mg daily started in July 2017.  Interim History: Gary [name] is here for a follow-up visit.  Since last visit, he reports no major changes in his health.  He continues to be active and attends activities of daily living.  His appetite and performance status is not changed.  He denies any bone pain or pathological fractures.  He continues to take Zytiga and has not reported any new side effects.  He denies any lower extremity edema, frequency or excessive fatigue.  He denies any hospitalization or illnesses.  His performance status and quality of life remain excellent.  He does not report any headaches, blurry vision, syncope or seizures.  He does not report any alteration in mental status or neuropathy.  He does not report any fevers or chills or sweats. He does not report any cough, wheezing or hemoptysis. Does not report any nausea, vomiting or abdominal pain. He does not report any frequency, urgency or hematuria. He does not report any dysuria. He does not report any arthralgias or myalgias.  He does not report any mood changes.  He does not report any lymphadenopathy or petechiae.  He does not report any skin rashes or lesions.   Remaining review of systems is negative.   Medications: I have reviewed the patient's current medications.  Current Outpatient Medications  Medication Sig Dispense Refill  . abiraterone acetate (ZYTIGA) 250 MG tablet Take 4 tablets (1,000 mg total) by mouth daily. Take on an empty stomach 1 hour before or 2 hours after a meal 120 tablet 0  . amLODipine (NORVASC) 5 MG tablet Take 1 tablet (5 mg total) by mouth daily. 90 tablet 1  . aspirin 81 MG tablet Take 81 mg by mouth daily.    Marland Kitchen atorvastatin (LIPITOR) 20 MG tablet Take 1 tablet (20 mg total) by mouth daily. 90 tablet 1  . carvedilol (COREG) 12.5 MG tablet Take 1 tablet (12.5 mg total) by mouth 2 (two) times daily with a meal. 180 tablet 1  . ferrous sulfate 325 (65 FE) MG tablet Take 1 tablet (325 mg total) 2 (two) times daily with a meal by mouth. 60 tablet 3  . hydrOXYzine (ATARAX/VISTARIL) 25 MG tablet Take 1 tablet (25 mg total) every 6 (six) hours by mouth. 12 tablet 0  . Leuprolide Acetate (LUPRON DEPOT, 58-MONTH, IM) Inject 1 each into the muscle. Every 6 months per Urology    . levothyroxine (SYNTHROID, LEVOTHROID) 88 MCG tablet Take 1 tablet (88 mcg total) by mouth daily before breakfast. 30 tablet 0  . lisinopril-hydrochlorothiazide (PRINZIDE,ZESTORETIC) 20-12.5 MG tablet Take 2 tablets by mouth daily. 180 tablet 1  . meloxicam (MOBIC) 7.5 MG tablet take 1 tablet by mouth once daily 30 tablet 0  . nitroGLYCERIN (NITROSTAT)  0.4 MG SL tablet Place 1 tablet (0.4 mg total) under the tongue every 5 (five) minutes as needed for chest pain. (Patient not taking: Reported on 12/09/2017) 25 tablet 3  . oxymetazoline (NASAL DECONGESTANT SPRAY) 0.05 % nasal spray As directed    . predniSONE (DELTASONE) 5 MG tablet Take 1 tablet (5 mg total) by mouth daily with breakfast. 90 tablet 3  . sertraline (ZOLOFT) 25 MG tablet Take 1 tablet (25 mg total) by mouth daily. 90 tablet 1  . sodium chloride (OCEAN) 0.65 % SOLN nasal spray Place 1 spray into both  nostrils as needed for congestion.     No current facility-administered medications for this visit.      Allergies: No Known Allergies  Past Medical History, Surgical history, Social history, and Family History remained unchanged by my review.   Physical Exam: Blood pressure 136/76, pulse (!) 58, temperature 98.6 F (37 C), temperature source Oral, resp. rate 18, height 6' (1.829 m), weight 255 lb 6.4 oz (115.8 kg), SpO2 97 %.    ECOG: 0 General appearance: Alert, awake gentleman without distress. Head: Atraumatic without abnormalities. Oropharynx: No thrush or ulcers. Eyes: Pupils are equal and round reactive to light.. Lymph nodes: No lymphadenopathy noted in the cervical, supraclavicular, or axillary nodes  Heart: Regular rate and rhythm.  No murmurs or gallops noted. Lung: Clear that any rhonchi, wheezes or dullness to percussion. Abdomin: Soft, nondistended with good bowel sounds.  No shifting dullness or ascites. Musculoskeletal: No joint deformity or effusion. Skin: No no petechia or ecchymosis. Neurological: No motor or sensory deficits.   Lab Results: Lab Results  Component Value Date   WBC 5.1 12/04/2017   HGB 13.7 12/04/2017   HCT 41.0 12/04/2017   MCV 77.1 (L) 12/04/2017   PLT 153 12/04/2017     Chemistry      Component Value Date/Time   NA 141 12/04/2017 0804   NA 140 08/27/2017 1259   K 4.1 12/04/2017 0804   K 3.8 08/27/2017 1259   CL 106 12/04/2017 0804   CO2 25 12/04/2017 0804   CO2 26 08/27/2017 1259   BUN 13 12/04/2017 0804   BUN 10.5 08/27/2017 1259   CREATININE 0.76 12/04/2017 0804   CREATININE 0.8 08/27/2017 1259      Component Value Date/Time   CALCIUM 9.8 12/04/2017 0804   CALCIUM 9.4 08/27/2017 1259   ALKPHOS 103 12/04/2017 0804   ALKPHOS 91 08/27/2017 1259   AST 30 12/04/2017 0804   AST 16 08/27/2017 1259   ALT 59 (H) 12/04/2017 0804   ALT 23 08/27/2017 1259   BILITOT 0.4 12/04/2017 0804   BILITOT 0.35 08/27/2017 1259        Results for [name], DEVAUL "TIM" (MRN [id number]) as of 12/04/2017 08:19  Ref. Range 08/27/2017 12:59  Prostate Specific Ag, Serum Latest Ref Range: 0.0 - 4.0 ng/mL <0.1      Impression and Plan:  62 year old man with:  1.  Castration-sensitive advanced prostate cancer diagnosed in October 2016.  He developed biochemical relapse with persistent elevation in his PSA.  He is on Zytiga 1000 mg with prednisone and has tolerated therapy very well.  Therapy was interrupted for 3 months but resumed in March 2019. His PSA remains undetectable without any signs or symptoms of recurrent disease.  Risks and benefits of continuing this medication was discussed today and is agreeable to continue.  We will continue to monitor him periodically and stage with CT scan and bone scan if he  develops any symptoms or PSA starts to rise.   2. Androgen depravation: Remains on Lupron without any complications related to that.  I recommended continuing this indefinitely.  3. Hypertension: His blood pressure is within normal range at this time.  We will continue to monitor him for any abnormalities in the future.  4. Hypokalemia: Potassium continues to be close within normal range but will monitor periodically on Zytiga.  5. LFT monitoring: His ALT was mildly elevated and will be monitored periodically on Zytiga.  This was repeated today.  6. Follow-up: Will be in 4 months.   15  minutes was spent with the patient face-to-face today.  More than 50% of time was dedicated to patient counseling, education and coordination of his care.    Zola Button, MD 5/30/20198:09 AM

## 2018-03-06 LAB — PROSTATE-SPECIFIC AG, SERUM (LABCORP)

## 2018-03-11 ENCOUNTER — Telehealth: Payer: Self-pay | Admitting: *Deleted

## 2018-03-11 NOTE — Telephone Encounter (Signed)
-----   Message from Wyatt Portela, MD sent at 03/10/2018  6:29 PM EDT ----- Please let him know his PSA

## 2018-03-11 NOTE — Telephone Encounter (Signed)
Spoke with patient, gave results of last PSA

## 2018-03-20 ENCOUNTER — Other Ambulatory Visit: Payer: Self-pay | Admitting: Internal Medicine

## 2018-03-23 ENCOUNTER — Other Ambulatory Visit: Payer: Self-pay | Admitting: Internal Medicine

## 2018-03-29 ENCOUNTER — Encounter: Payer: Self-pay | Admitting: Internal Medicine

## 2018-04-01 ENCOUNTER — Other Ambulatory Visit: Payer: Self-pay | Admitting: Internal Medicine

## 2018-04-01 ENCOUNTER — Other Ambulatory Visit (INDEPENDENT_AMBULATORY_CARE_PROVIDER_SITE_OTHER): Payer: BLUE CROSS/BLUE SHIELD

## 2018-04-01 DIAGNOSIS — E039 Hypothyroidism, unspecified: Secondary | ICD-10-CM

## 2018-04-01 DIAGNOSIS — Z0000 Encounter for general adult medical examination without abnormal findings: Secondary | ICD-10-CM

## 2018-04-01 LAB — TSH: TSH: 3.69 u[IU]/mL (ref 0.35–4.50)

## 2018-04-08 ENCOUNTER — Other Ambulatory Visit: Payer: Self-pay | Admitting: *Deleted

## 2018-04-08 DIAGNOSIS — C61 Malignant neoplasm of prostate: Secondary | ICD-10-CM

## 2018-04-08 MED ORDER — ABIRATERONE ACETATE 250 MG PO TABS: 1000.0000 mg | ORAL_TABLET | Freq: Every day | ORAL | 0 refills | Status: DC

## 2018-05-18 ENCOUNTER — Other Ambulatory Visit: Payer: Self-pay | Admitting: *Deleted

## 2018-05-18 ENCOUNTER — Telehealth: Payer: Self-pay

## 2018-05-18 DIAGNOSIS — C61 Malignant neoplasm of prostate: Secondary | ICD-10-CM

## 2018-05-18 MED ORDER — ABIRATERONE ACETATE 250 MG PO TABS: 1000.0000 mg | ORAL_TABLET | Freq: Every day | ORAL | 0 refills | Status: DC

## 2018-05-18 NOTE — Telephone Encounter (Signed)
Oral Oncology Patient Advocate Encounter  Prior Authorization for Gary [name] has been approved.    PA# [id number] Effective dates: 05/18/18 through 06/19/19  Oral Oncology Clinic will continue to follow.   [place] Patient [place] Phone [phone] Fax [phone]

## 2018-05-18 NOTE — Telephone Encounter (Signed)
Oral Oncology Patient Advocate Encounter  Received notification from [employer] that the existing prior authorization for Gary [name] is due for renewal.  Renewal PA submitted by phone [phone] Status is pending  Oral Oncology Clinic will continue to follow.  [place] Patient [name] Phone [phone] Fax [phone]

## 2018-06-09 ENCOUNTER — Encounter: Payer: Self-pay | Admitting: Internal Medicine

## 2018-06-10 ENCOUNTER — Telehealth: Payer: Self-pay | Admitting: Medical

## 2018-06-10 ENCOUNTER — Ambulatory Visit (INDEPENDENT_AMBULATORY_CARE_PROVIDER_SITE_OTHER): Payer: BLUE CROSS/BLUE SHIELD | Admitting: Medical

## 2018-06-10 ENCOUNTER — Encounter: Payer: Self-pay | Admitting: Medical

## 2018-06-10 ENCOUNTER — Ambulatory Visit (HOSPITAL_BASED_OUTPATIENT_CLINIC_OR_DEPARTMENT_OTHER)
Admission: RE | Admit: 2018-06-10 | Discharge: 2018-06-10 | Disposition: A | Discharge status: Home / Self Care | Payer: BLUE CROSS/BLUE SHIELD | Source: Ambulatory Visit | Attending: Medical | Admitting: Medical

## 2018-06-10 VITALS — BP 131/66 | HR 51 | Temp 98.5°F | Ht 72.0 in | Wt 257.6 lb

## 2018-06-10 DIAGNOSIS — M79674 Pain in right toe(s): Secondary | ICD-10-CM

## 2018-06-10 DIAGNOSIS — X58XXXA Exposure to other specified factors, initial encounter: Secondary | ICD-10-CM | POA: Insufficient documentation

## 2018-06-10 DIAGNOSIS — Z981 Arthrodesis status: Secondary | ICD-10-CM | POA: Insufficient documentation

## 2018-06-10 DIAGNOSIS — S92424A Nondisplaced fracture of distal phalanx of right great toe, initial encounter for closed fracture: Secondary | ICD-10-CM | POA: Insufficient documentation

## 2018-06-10 DIAGNOSIS — S92405A Nondisplaced unspecified fracture of left great toe, initial encounter for closed fracture: Secondary | ICD-10-CM

## 2018-06-10 MED ORDER — DOXYCYCLINE HYCLATE 100 MG PO TABS: 100.0000 mg | ORAL_TABLET | Freq: Two times a day (BID) | ORAL | 0 refills | Status: DC

## 2018-06-10 NOTE — Patient Instructions (Addendum)
For foot trauma and pain, will get xray of your foot/toe. For possible infection will rx doxycycline antibioitic. Can also use low dose ibuprofen for pain and inflammation.  After discussion decided not to get cbc after discussion.  Follow up on Friday at 1 pm to make sure healing appropiate before the weekend.

## 2018-06-10 NOTE — Progress Notes (Signed)
Subjective:    Patient ID: Gary [name], male    DOB: August 04, 1956, 62 y.o.   MRN: [id number]  HPI  Pt in for rt great toe that is swollen. He was working out with 100 lb weight and when he layed it down on toe or near his toe. Initially states landed on medial aspect though he then admits maybe it rolled on his toe(accident happened on Saturday). Pt has history of toe surgery in past. He states hx of fracture in past and it grew back with clawed appearance. Then states he had the surgery.  Pt had mild elevated blood sugars in past A1-C was 5.8.  Initially toe was severe swollen about twice as swollen as it was before. He had large bruise at base of toe top aspect, medial and at bottom of toe.    Review of Systems  Constitutional: Negative for chills, fatigue and fever.  Respiratory: Negative for chest tightness, shortness of breath and wheezing.   Cardiovascular: Negative for chest pain and palpitations.  Musculoskeletal: Negative for back pain.       Toe pain initially.  Skin: Negative for rash.  Hematological: Negative for adenopathy. Does not bruise/bleed easily.  Psychiatric/Behavioral: Negative for behavioral problems and confusion.    Past Medical History:  Diagnosis Date  . Anxiety   . CAD (coronary artery disease) 10-2012   h/o stents   . Elevated PSA    Prostate Bx w/ Dr. Tresa [name]  . EtOH dependence ([place])   . Family history of adverse reaction to anesthesia    made father disoriented  . Gross hematuria   . Hepatic steatosis   . Hyperlipidemia LDL goal < 70   . Hypertension   . Metastatic adenocarcinoma to prostate ([place]) 2017   Gleason score 9, metastasis to R external Iliac node and large extraprostatic extension at prostatectomy  . Multiple sclerosis ([place]) 1991   Bilateral optic neuritis, treated and resolved but felt to be a manifestation of MS.  . Renal cyst, left    Bosniak 2K (indeterminant) hyperdense cyst CT 2014, Stable CT in 2016  . Ulcerative colitis  ([place])      Social History   Socioeconomic History  . Marital status: Married    Spouse name: Not on file  . Number of children: 1  . Years of education: Not on file  . Highest education level: Not on file  Occupational History  . Occupation: Actuary: [employer]  Social Needs  . Financial resource strain: Not on file  . Food insecurity:    Worry: Not on file    Inability: Not on file  . Transportation needs:    Medical: Not on file    Non-medical: Not on file  Tobacco Use  . Smoking status: Former Smoker    Last attempt to quit: 07/18/1990    Years since quitting: 27.9  . Smokeless tobacco: Former Systems developer  . Tobacco comment: 1991  Substance and Sexual Activity  . Alcohol use: Yes    Comment: weekly  . Drug use: Yes    Types: Marijuana  . Sexual activity: Not on file  Lifestyle  . Physical activity:    Days per week: Not on file    Minutes per session: Not on file  . Stress: Not on file  Relationships  . Social connections:    Talks on phone: Not on file    Gets together: Not on file    Attends religious service: Not  on file    Active member of club or organization: Not on file    Attends meetings of clubs or organizations: Not on file    Relationship status: Not on file  . Intimate partner violence:    Fear of current or ex partner: Not on file    Emotionally abused: Not on file    Physically abused: Not on file    Forced sexual activity: Not on file  Other Topics Concern  . Not on file  Social History Narrative   Household-- pt and wife   Has an adult son    Past Surgical History:  Procedure Laterality Date  . FOOT SURGERY     R toe Fx (screws),  L dorsal FX (screws)  . LEFT HEART CATHETERIZATION WITH CORONARY ANGIOGRAM N/A 10/22/2012   Procedure: LEFT HEART CATHETERIZATION WITH CORONARY ANGIOGRAM;  Surgeon: Sinclair Grooms, MD;  [facility] C[facility] LAB;  Service: Cardiovascular;  Laterality: N/A;  . LYMPHADENECTOMY Bilateral 11/24/2015    Procedure: BILATERAL PELVIC LYMPHADENECTOMY;  Surgeon: Alexis Frock, MD;  Location: [facility] ORS;  Service: Urology;  Laterality: Bilateral;  . PERCUTANEOUS CORONARY STENT INTERVENTION (PCI-S) N/A 10/23/2012   Procedure: PERCUTANEOUS CORONARY STENT INTERVENTION (PCI-S);  Surgeon: Sinclair Grooms, MD;  Location: [facility] CATH LAB;  Service: Cardiovascular;  Laterality: N/A;  . PROSTATE BIOPSY  09/2015   Dr. Tresa [name]  . ROBOT ASSISTED LAPAROSCOPIC RADICAL PROSTATECTOMY N/A 11/24/2015   Procedure: XI ROBOTIC ASSISTED LAPAROSCOPIC RADICAL PROSTATECTOMY WITH INDOCYANINE GREEN DYE;  Surgeon: Alexis Frock, MD;  Location: [facility] ORS;  Service: Urology;  Laterality: N/A;  . VASECTOMY      Family History  Problem Relation Age of Onset  . Dementia Mother   . Hypertension Mother   . Coronary artery disease Father   . Diabetes Mellitus II Father   . Coronary artery disease Brother   . Hypertension Brother   . Prostate cancer Brother   . Colon cancer Neg Hx     No Known Allergies  Current Outpatient Medications on File Prior to Visit  Medication Sig Dispense Refill  . abiraterone acetate (ZYTIGA) 250 MG tablet Take 4 tablets (1,000 mg total) by mouth daily. Take on an empty stomach 1 hour before or 2 hours after a meal 120 tablet 0  . amLODipine (NORVASC) 5 MG tablet Take 1 tablet (5 mg total) by mouth daily. 90 tablet 1  . aspirin 81 MG tablet Take 81 mg by mouth daily.    Marland Kitchen atorvastatin (LIPITOR) 20 MG tablet Take 1 tablet (20 mg total) by mouth daily. 90 tablet 1  . carvedilol (COREG) 12.5 MG tablet Take 1 tablet (12.5 mg total) by mouth 2 (two) times daily with a meal. 180 tablet 1  . ferrous sulfate 325 (65 FE) MG tablet Take 1 tablet (325 mg total) 2 (two) times daily with a meal by mouth. 60 tablet 3  . hydrOXYzine (ATARAX/VISTARIL) 25 MG tablet Take 1 tablet (25 mg total) every 6 (six) hours by mouth. 12 tablet 0  . Leuprolide Acetate (LUPRON DEPOT, 15-MONTH, IM) Inject 1 each into the muscle. Every 6  months per Urology    . levothyroxine (SYNTHROID, LEVOTHROID) 88 MCG tablet Take 1 tablet (88 mcg total) by mouth daily before breakfast. 30 tablet 5  . lisinopril-hydrochlorothiazide (PRINZIDE,ZESTORETIC) 20-12.5 MG tablet Take 2 tablets by mouth daily. 180 tablet 1  . meloxicam (MOBIC) 7.5 MG tablet take 1 tablet by mouth once daily 30 tablet 0  . nitroGLYCERIN (NITROSTAT)  0.4 MG SL tablet Place 1 tablet (0.4 mg total) under the tongue every 5 (five) minutes as needed for chest pain. 25 tablet 3  . oxymetazoline (NASAL DECONGESTANT SPRAY) 0.05 % nasal spray As directed    . predniSONE (DELTASONE) 5 MG tablet Take 1 tablet (5 mg total) by mouth daily with breakfast. 90 tablet 3  . sertraline (ZOLOFT) 25 MG tablet Take 1 tablet (25 mg total) by mouth daily. 90 tablet 1  . sodium chloride (OCEAN) 0.65 % SOLN nasal spray Place 1 spray into both nostrils as needed for congestion.     No current facility-administered medications on file prior to visit.     BP 131/66 (BP Location: Right Arm, Patient Position: Sitting, Cuff Size: Large)   Pulse (!) 51   Temp 98.5 F (36.9 C) (Oral)   Ht 6' (1.829 m)   Wt 257 lb 9.6 oz (116.8 kg)   SpO2 99%   BMI 34.94 kg/m       Objective:   Physical Exam  General- No acute distress. Pleasant patient. Neck- Full range of motion, no jvd Lungs- Clear, even and unlabored. Heart- regular rate and rhythm. Neurologic- CNII- XII grossly intact.  Rt great toe- large appearance to toe. Bruising around base toe.top of and all way around the toe.      Assessment & Plan:  For foot trauma and pain, will get xray of your foot/toe. For possible infection will rx doxycycline antibioitic. Can also use low dose ibuprofen for pain and inflammation.  After discussion decided not to get cbc after discussion(pt was expressing concern for cost) He initially did not want to me to get xray but then he agreed to after explaining need to evaluate)  Follow up on Friday at 1  pm to make sure healing appropiate before the weekend.  Mackie Pai, PA-C

## 2018-06-10 NOTE — Telephone Encounter (Signed)
Will you see referral to sports med md.

## 2018-06-15 ENCOUNTER — Other Ambulatory Visit: Payer: Self-pay | Admitting: *Deleted

## 2018-06-15 ENCOUNTER — Ambulatory Visit (INDEPENDENT_AMBULATORY_CARE_PROVIDER_SITE_OTHER): Payer: BLUE CROSS/BLUE SHIELD | Admitting: Family Medicine

## 2018-06-15 ENCOUNTER — Encounter: Payer: Self-pay | Admitting: Family Medicine

## 2018-06-15 VITALS — BP 145/58 | HR 51 | Ht 72.0 in | Wt 255.0 lb

## 2018-06-15 DIAGNOSIS — S92414A Nondisplaced fracture of proximal phalanx of right great toe, initial encounter for closed fracture: Secondary | ICD-10-CM | POA: Diagnosis not present

## 2018-06-15 DIAGNOSIS — C61 Malignant neoplasm of prostate: Secondary | ICD-10-CM

## 2018-06-15 MED ORDER — ABIRATERONE ACETATE 250 MG PO TABS: 1000.0000 mg | ORAL_TABLET | Freq: Every day | ORAL | 0 refills | Status: DC

## 2018-06-15 NOTE — Patient Instructions (Signed)
You're doing much better than expected. Ice 15 minutes at a time as needed. Elevate above your heart as needed for swelling. Call me if you have any problems otherwise follow up as needed.

## 2018-06-15 NOTE — Progress Notes (Signed)
PCP: Colon Branch, MD Consultation requested by: Mackie Pai PA-C  Subjective:   HPI: Patient is a 62 y.o. male here for right great toe fracture.  Patient states that on 06/07/2018 he was moving a 100 pound dumbbell in his garage and dropped it onto his toe.  He reports that he was able to finish his work in the garage that day and was seen later that evening.  He has had continued swelling but it has significantly improved.  He previous had a blood blister over the dorsal aspect of the great toe which also has since resolved.  He denies any numbness or tingling in the toe.  He has been ambulating in normal shoes without pain.  Pain was previously sharp in great toe.  Of note, patient has had prior surgery to this toe; 20+ years ago she had a cortical screw placed to fuse the IP joint.   Past Medical History:  Diagnosis Date  . Anxiety   . CAD (coronary artery disease) 10-2012   h/o stents   . Elevated PSA    Prostate Bx w/ Dr. Tresa [name]  . EtOH dependence ([place])   . Family history of adverse reaction to anesthesia    made father disoriented  . Gross hematuria   . Hepatic steatosis   . Hyperlipidemia LDL goal < 70   . Hypertension   . Metastatic adenocarcinoma to prostate ([place]) 2017   Gleason score 9, metastasis to R external Iliac node and large extraprostatic extension at prostatectomy  . Multiple sclerosis ([place]) 1991   Bilateral optic neuritis, treated and resolved but felt to be a manifestation of MS.  . Renal cyst, left    Bosniak 2K (indeterminant) hyperdense cyst CT 2014, Stable CT in 2016  . Ulcerative colitis ([place])     Current Outpatient Medications on File Prior to Visit  Medication Sig Dispense Refill  . amLODipine (NORVASC) 5 MG tablet Take 1 tablet (5 mg total) by mouth daily. 90 tablet 1  . aspirin 81 MG tablet Take 81 mg by mouth daily.    Marland Kitchen atorvastatin (LIPITOR) 20 MG tablet Take 1 tablet (20 mg total) by mouth daily. 90 tablet 1  . carvedilol (COREG) 12.5 MG tablet  Take 1 tablet (12.5 mg total) by mouth 2 (two) times daily with a meal. 180 tablet 1  . doxycycline (VIBRA-TABS) 100 MG tablet Take 1 tablet (100 mg total) by mouth 2 (two) times daily. Can give caps or generic 20 tablet 0  . ferrous sulfate 325 (65 FE) MG tablet Take 1 tablet (325 mg total) 2 (two) times daily with a meal by mouth. 60 tablet 3  . hydrOXYzine (ATARAX/VISTARIL) 25 MG tablet Take 1 tablet (25 mg total) every 6 (six) hours by mouth. 12 tablet 0  . Leuprolide Acetate (LUPRON DEPOT, 48-MONTH, IM) Inject 1 each into the muscle. Every 6 months per Urology    . levothyroxine (SYNTHROID, LEVOTHROID) 88 MCG tablet Take 1 tablet (88 mcg total) by mouth daily before breakfast. 30 tablet 5  . lisinopril-hydrochlorothiazide (PRINZIDE,ZESTORETIC) 20-12.5 MG tablet Take 2 tablets by mouth daily. 180 tablet 1  . meloxicam (MOBIC) 7.5 MG tablet take 1 tablet by mouth once daily 30 tablet 0  . nitroGLYCERIN (NITROSTAT) 0.4 MG SL tablet Place 1 tablet (0.4 mg total) under the tongue every 5 (five) minutes as needed for chest pain. 25 tablet 3  . oxymetazoline (NASAL DECONGESTANT SPRAY) 0.05 % nasal spray As directed    . predniSONE (DELTASONE)  5 MG tablet Take 1 tablet (5 mg total) by mouth daily with breakfast. 90 tablet 3  . sertraline (ZOLOFT) 25 MG tablet Take 1 tablet (25 mg total) by mouth daily. 90 tablet 1  . sodium chloride (OCEAN) 0.65 % SOLN nasal spray Place 1 spray into both nostrils as needed for congestion.     No current facility-administered medications on file prior to visit.     Past Surgical History:  Procedure Laterality Date  . FOOT SURGERY     R toe Fx (screws),  L dorsal FX (screws)  . LEFT HEART CATHETERIZATION WITH CORONARY ANGIOGRAM N/A 10/22/2012   Procedure: LEFT HEART CATHETERIZATION WITH CORONARY ANGIOGRAM;  Surgeon: Sinclair Grooms, MD;  Location: [facility] CATH LAB;  Service: Cardiovascular;  Laterality: N/A;  . LYMPHADENECTOMY Bilateral 11/24/2015   Procedure: BILATERAL  PELVIC LYMPHADENECTOMY;  Surgeon: Alexis Frock, MD;  Location: [facility] ORS;  Service: Urology;  Laterality: Bilateral;  . PERCUTANEOUS CORONARY STENT INTERVENTION (PCI-S) N/A 10/23/2012   Procedure: PERCUTANEOUS CORONARY STENT INTERVENTION (PCI-S);  Surgeon: Sinclair Grooms, MD;  Location: [facility] CATH LAB;  Service: Cardiovascular;  Laterality: N/A;  . PROSTATE BIOPSY  09/2015   Dr. Tresa [name]  . ROBOT ASSISTED LAPAROSCOPIC RADICAL PROSTATECTOMY N/A 11/24/2015   Procedure: XI ROBOTIC ASSISTED LAPAROSCOPIC RADICAL PROSTATECTOMY WITH INDOCYANINE GREEN DYE;  Surgeon: Alexis Frock, MD;  Location: [facility] ORS;  Service: Urology;  Laterality: N/A;  . VASECTOMY      No Known Allergies  Social History   Socioeconomic History  . Marital status: Married    Spouse name: Not on file  . Number of children: 1  . Years of education: Not on file  . Highest education level: Not on file  Occupational History  . Occupation: Actuary: [employer]  Social Needs  . Financial resource strain: Not on file  . Food insecurity:    Worry: Not on file    Inability: Not on file  . Transportation needs:    Medical: Not on file    Non-medical: Not on file  Tobacco Use  . Smoking status: Former Smoker    Last attempt to quit: 07/18/1990    Years since quitting: 27.9  . Smokeless tobacco: Former Systems developer  . Tobacco comment: 1991  Substance and Sexual Activity  . Alcohol use: Yes    Comment: weekly  . Drug use: Yes    Types: Marijuana  . Sexual activity: Not on file  Lifestyle  . Physical activity:    Days per week: Not on file    Minutes per session: Not on file  . Stress: Not on file  Relationships  . Social connections:    Talks on phone: Not on file    Gets together: Not on file    Attends religious service: Not on file    Active member of club or organization: Not on file    Attends meetings of clubs or organizations: Not on file    Relationship status: Not on file  . Intimate partner violence:     Fear of current or ex partner: Not on file    Emotionally abused: Not on file    Physically abused: Not on file    Forced sexual activity: Not on file  Other Topics Concern  . Not on file  Social History Narrative   Household-- pt and wife   Has an adult son    Family History  Problem Relation Age of Onset  . Dementia  Mother   . Hypertension Mother   . Coronary artery disease Father   . Diabetes Mellitus II Father   . Coronary artery disease Brother   . Hypertension Brother   . Prostate cancer Brother   . Colon cancer Neg Hx     BP (!) 145/58   Pulse (!) 51   Ht 6' (1.829 m)   Wt 255 lb (115.7 kg)   BMI 34.58 kg/m   Review of Systems: See HPI above.     Objective:  Physical Exam:  Gen: awake, alert, NAD, comfortable in exam room Pulm: breathing unlabored  Right foot: Inspection: Right great toe appears swollen.  There is ecchymosis over the dorsal and lateral aspects of the toe.  No significant erythema Palpation: Mild tenderness palpation over the dorsal aspect of the great toe ROM: Full ROM of the ankle. Decreased great toe flexion and extension. Strength: 5/5 strength ankle, other digits. Neurovascular: N/V intact distally in the lower extremity.  Cap refill less than 2 seconds  MSK ultrasound: Limited ultrasound evaluation of the right great toe and area of interest shows and confirms fracture of the proximal phalanx.  Left foot: no deformity. No ttp, full ROM, NV intact. 5/5 strength   Assessment & Plan:  1.  Fracture of the proximal phalanx of the right great toe - patient had previous x-rays performed that were independently reviewed today.  There is subtle evidence of a fracture of the proximal phalanx - confirmed with MSK u/s today.  There is a cortical screw in place fusing the IP joint.  There is no disruption of the hardware.  At this time patient's pain is well controlled.  Given the placement of prior orthopedic hardware, the fracture is stable, will  not require repeat imaging.  Continue Tylenol or NSAIDs as needed for pain and swelling.  Ice 15 minutes 3-4 times daily. follow-up as needed.

## 2018-06-16 ENCOUNTER — Encounter: Payer: Self-pay | Admitting: Family Medicine

## 2018-07-09 DIAGNOSIS — N393 Stress incontinence (female) (male): Secondary | ICD-10-CM | POA: Diagnosis not present

## 2018-07-09 DIAGNOSIS — C61 Malignant neoplasm of prostate: Secondary | ICD-10-CM | POA: Diagnosis not present

## 2018-07-09 DIAGNOSIS — N5201 Erectile dysfunction due to arterial insufficiency: Secondary | ICD-10-CM | POA: Diagnosis not present

## 2018-07-09 DIAGNOSIS — C775 Secondary and unspecified malignant neoplasm of intrapelvic lymph nodes: Secondary | ICD-10-CM | POA: Diagnosis not present

## 2018-07-13 ENCOUNTER — Encounter: Payer: Self-pay | Admitting: Internal Medicine

## 2018-07-13 ENCOUNTER — Other Ambulatory Visit: Payer: Self-pay | Admitting: *Deleted

## 2018-07-13 DIAGNOSIS — C61 Malignant neoplasm of prostate: Secondary | ICD-10-CM

## 2018-07-13 MED ORDER — ABIRATERONE ACETATE 250 MG PO TABS: 1000.0000 mg | ORAL_TABLET | Freq: Every day | ORAL | 0 refills | Status: DC

## 2018-07-24 DIAGNOSIS — Z5111 Encounter for antineoplastic chemotherapy: Secondary | ICD-10-CM | POA: Diagnosis not present

## 2018-07-24 DIAGNOSIS — C61 Malignant neoplasm of prostate: Secondary | ICD-10-CM | POA: Diagnosis not present

## 2018-08-08 ENCOUNTER — Other Ambulatory Visit: Payer: Self-pay | Admitting: Oncology

## 2018-08-08 DIAGNOSIS — C61 Malignant neoplasm of prostate: Secondary | ICD-10-CM

## 2018-08-10 ENCOUNTER — Other Ambulatory Visit: Payer: Self-pay | Admitting: Internal Medicine

## 2018-08-12 ENCOUNTER — Encounter: Payer: BLUE CROSS/BLUE SHIELD | Admitting: Internal Medicine

## 2018-08-13 ENCOUNTER — Other Ambulatory Visit: Payer: Self-pay | Admitting: Internal Medicine

## 2018-08-19 ENCOUNTER — Encounter: Payer: Self-pay | Admitting: Internal Medicine

## 2018-08-19 ENCOUNTER — Ambulatory Visit (INDEPENDENT_AMBULATORY_CARE_PROVIDER_SITE_OTHER): Payer: BLUE CROSS/BLUE SHIELD | Admitting: Internal Medicine

## 2018-08-19 VITALS — BP 142/74 | HR 47 | Temp 97.9°F | Resp 16 | Ht 72.0 in | Wt 254.4 lb

## 2018-08-19 DIAGNOSIS — F419 Anxiety disorder, unspecified: Secondary | ICD-10-CM

## 2018-08-19 DIAGNOSIS — F1020 Alcohol dependence, uncomplicated: Secondary | ICD-10-CM | POA: Diagnosis not present

## 2018-08-19 DIAGNOSIS — Z0000 Encounter for general adult medical examination without abnormal findings: Secondary | ICD-10-CM | POA: Diagnosis not present

## 2018-08-19 DIAGNOSIS — E039 Hypothyroidism, unspecified: Secondary | ICD-10-CM | POA: Diagnosis not present

## 2018-08-19 DIAGNOSIS — F32A Depression, unspecified: Secondary | ICD-10-CM

## 2018-08-19 DIAGNOSIS — F329 Major depressive disorder, single episode, unspecified: Secondary | ICD-10-CM

## 2018-08-19 DIAGNOSIS — C61 Malignant neoplasm of prostate: Secondary | ICD-10-CM

## 2018-08-19 LAB — COMPREHENSIVE METABOLIC PANEL
ALBUMIN: 4.7 g/dL (ref 3.5–5.2)
ALT: 32 U/L (ref 0–53)
AST: 24 U/L (ref 0–37)
Alkaline Phosphatase: 98 U/L (ref 39–117)
BILIRUBIN TOTAL: 0.8 mg/dL (ref 0.2–1.2)
BUN: 11 mg/dL (ref 6–23)
CO2: 29 meq/L (ref 19–32)
CREATININE: 0.71 mg/dL (ref 0.40–1.50)
Calcium: 9.5 mg/dL (ref 8.4–10.5)
Chloride: 101 mEq/L (ref 96–112)
GFR: 145.59 mL/min (ref >60.00)
Glucose, Bld: 114 mg/dL — ABNORMAL HIGH (ref 70–99)
Potassium: 3.9 mEq/L (ref 3.5–5.1)
Sodium: 139 mEq/L (ref 135–145)
Total Protein: 7.8 g/dL (ref 6.0–8.3)

## 2018-08-19 LAB — LIPID PANEL
Cholesterol: 153 mg/dL (ref 0–200)
HDL: 37.9 mg/dL — ABNORMAL LOW (ref >39.00)
NONHDL: 115.18
Total CHOL/HDL Ratio: 4
Triglycerides: 205 mg/dL — ABNORMAL HIGH (ref 0.0–149.0)
VLDL: 41 mg/dL — ABNORMAL HIGH (ref 0.0–40.0)

## 2018-08-19 LAB — LDL CHOLESTEROL, DIRECT: LDL DIRECT: 93 mg/dL

## 2018-08-19 LAB — TSH: TSH: 1.52 u[IU]/mL (ref 0.35–4.50)

## 2018-08-19 LAB — HEMOGLOBIN A1C: HEMOGLOBIN A1C: 5.6 % (ref 4.6–6.5)

## 2018-08-19 MED ORDER — SERTRALINE HCL 50 MG PO TABS: 50.0000 mg | ORAL_TABLET | Freq: Every day | ORAL | 1 refills | Status: DC

## 2018-08-19 MED ORDER — NITROGLYCERIN 0.4 MG SL SUBL: 0.4000 mg | SUBLINGUAL_TABLET | SUBLINGUAL | 3 refills | Status: DC | PRN

## 2018-08-19 NOTE — Progress Notes (Signed)
Subjective:    Patient ID: Gary [name], male    DOB: 1956/05/17, 62 y.o.   MRN: [id number]  DOS:  08/19/2018 Type of visit - description : cpx Interval history: Has a few concerns  BP Readings from Last 3 Encounters:  08/19/18 (!) 142/74  06/15/18 (!) 145/58  06/10/18 131/66     Review of Systems Stress at work has increased, wife has noted him to be irritable.  PHQ 9 today is negative he may however feels a little down, "life is not fun anymore".  No suicidal ideas  Other than above, a 14 point review of systems is negative    Past Medical History:  Diagnosis Date  . Anxiety   . CAD (coronary artery disease) 10-2012   h/o stents   . Elevated PSA    Prostate Bx w/ Dr. Tresa [name]  . EtOH dependence ([place])   . Family history of adverse reaction to anesthesia    made father disoriented  . Gross hematuria   . Hepatic steatosis   . Hyperlipidemia LDL goal < 70   . Hypertension   . Metastatic adenocarcinoma to prostate ([place]) 2017   Gleason score 9, metastasis to R external Iliac node and large extraprostatic extension at prostatectomy  . Multiple sclerosis ([place]) 1991   Bilateral optic neuritis, treated and resolved but felt to be a manifestation of MS.  . Renal cyst, left    Bosniak 2K (indeterminant) hyperdense cyst CT 2014, Stable CT in 2016  . Ulcerative colitis [facility])     Past Surgical History:  Procedure Laterality Date  . FOOT SURGERY     R toe Fx (screws),  L dorsal FX (screws)  . LEFT HEART CATHETERIZATION WITH CORONARY ANGIOGRAM N/A 10/22/2012   Procedure: LEFT HEART CATHETERIZATION WITH CORONARY ANGIOGRAM;  Surgeon: Sinclair Grooms, MD;  Location: [facility] CATH LAB;  Service: Cardiovascular;  Laterality: N/A;  . LYMPHADENECTOMY Bilateral 11/24/2015   Procedure: BILATERAL PELVIC LYMPHADENECTOMY;  Surgeon: Alexis Frock, MD;  Location: [facility] ORS;  Service: Urology;  Laterality: Bilateral;  . PERCUTANEOUS CORONARY STENT INTERVENTION (PCI-S) N/A 10/23/2012   Procedure:  PERCUTANEOUS CORONARY STENT INTERVENTION (PCI-S);  Surgeon: Sinclair Grooms, MD;  Location: [facility] CATH LAB;  Service: Cardiovascular;  Laterality: N/A;  . PROSTATE BIOPSY  09/2015   Dr. Tresa [name]  . ROBOT ASSISTED LAPAROSCOPIC RADICAL PROSTATECTOMY N/A 11/24/2015   Procedure: XI ROBOTIC ASSISTED LAPAROSCOPIC RADICAL PROSTATECTOMY WITH INDOCYANINE GREEN DYE;  Surgeon: Alexis Frock, MD;  Location: [facility] ORS;  Service: Urology;  Laterality: N/A;  . VASECTOMY      Social History   Socioeconomic History  . Marital status: Married    Spouse name: Not on file  . Number of children: 1  . Years of education: Not on file  . Highest education level: Not on file  Occupational History  . Occupation: Actuary: [employer]  Social Needs  . Financial resource strain: Not on file  . Food insecurity:    Worry: Not on file    Inability: Not on file  . Transportation needs:    Medical: Not on file    Non-medical: Not on file  Tobacco Use  . Smoking status: Former Smoker    Last attempt to quit: 07/18/1990    Years since quitting: 28.1  . Smokeless tobacco: Former Systems developer  . Tobacco comment: 1991  Substance and Sexual Activity  . Alcohol use: Yes    Comment: weekly  . Drug use: Yes  Types: Marijuana  . Sexual activity: Not on file  Lifestyle  . Physical activity:    Days per week: Not on file    Minutes per session: Not on file  . Stress: Not on file  Relationships  . Social connections:    Talks on phone: Not on file    Gets together: Not on file    Attends religious service: Not on file    Active member of club or organization: Not on file    Attends meetings of clubs or organizations: Not on file    Relationship status: Not on file  . Intimate partner violence:    Fear of current or ex partner: Not on file    Emotionally abused: Not on file    Physically abused: Not on file    Forced sexual activity: Not on file  Other Topics Concern  . Not on file  Social History Narrative    Household-- pt and wife   Has an adult son     Family History  Problem Relation Age of Onset  . Dementia Mother   . Hypertension Mother   . Coronary artery disease Father   . Diabetes Mellitus II Father   . Lupus Sister   . Coronary artery disease Brother   . Diabetes Brother   . Hypertension Brother   . Prostate cancer Brother   . Thyroid cancer Sister   . Heart failure Sister   . Colon cancer Neg Hx      Allergies as of 08/19/2018   No Known Allergies     Medication List        Accurate as of 08/19/18 11:59 PM. Always use your most recent med list.          amLODipine 5 MG tablet Commonly known as:  NORVASC Take 1 tablet (5 mg total) by mouth daily.   aspirin 81 MG tablet Take 81 mg by mouth daily.   atorvastatin 20 MG tablet Commonly known as:  LIPITOR Take 1 tablet (20 mg total) by mouth daily.   carvedilol 12.5 MG tablet Commonly known as:  COREG Take 1 tablet (12.5 mg total) by mouth 2 (two) times daily with a meal.   ferrous sulfate 325 (65 FE) MG tablet Take 1 tablet (325 mg total) 2 (two) times daily with a meal by mouth.   hydrOXYzine 25 MG tablet Commonly known as:  ATARAX/VISTARIL Take 1 tablet (25 mg total) every 6 (six) hours by mouth.   levothyroxine 88 MCG tablet Commonly known as:  SYNTHROID, LEVOTHROID Take 1 tablet (88 mcg total) by mouth daily before breakfast.   lisinopril-hydrochlorothiazide 20-12.5 MG tablet Commonly known as:  PRINZIDE,ZESTORETIC Take 2 tablets by mouth daily.   LUPRON DEPOT (30-MONTH) IM Inject 1 each into the muscle. Every 6 months per Urology   meloxicam 7.5 MG tablet Commonly known as:  MOBIC take 1 tablet by mouth once daily   NASAL DECONGESTANT SPRAY 0.05 % nasal spray Generic drug:  oxymetazoline As directed   nitroGLYCERIN 0.4 MG SL tablet Commonly known as:  NITROSTAT Place 1 tablet (0.4 mg total) under the tongue every 5 (five) minutes as needed for chest pain.   predniSONE 5 MG  tablet Commonly known as:  DELTASONE Take 1 tablet (5 mg total) by mouth daily with breakfast.   sertraline 50 MG tablet Commonly known as:  ZOLOFT Take 1 tablet (50 mg total) by mouth daily.   sodium chloride 0.65 % Soln nasal spray Commonly known as:  OCEAN Place 1 spray into both nostrils as needed for congestion.   ZYTIGA 250 MG tablet Generic drug:  abiraterone acetate TAKE 4 TABLETS (1,000MG) BY MOUTH ONCE DAILY ON AN  EMPTY STOMACH 1 HOUR BEFORE OR 2 HOURS AFTER A MEAL          Objective:   Physical Exam BP (!) 142/74 (BP Location: Left Arm, Patient Position: Sitting, Cuff Size: Normal)   Pulse (!) 47   Temp 97.9 F (36.6 C) (Oral)   Resp 16   Ht 6' (1.829 m)   Wt 254 lb 6 oz (115.4 kg)   SpO2 97%   BMI 34.50 kg/m  General: Well developed, NAD, BMI noted Neck: No  thyromegaly  HEENT:  Normocephalic . Face symmetric, atraumatic Lungs:  CTA B Normal respiratory effort, no intercostal retractions, no accessory muscle use. Heart: RRR,  no murmur.  No pretibial edema bilaterally  Abdomen:  Not distended, soft, non-tender. No rebound or rigidity.   Skin: Exposed areas without rash. Not pale. Not jaundice Neurologic:  alert & oriented X3.  Speech normal, gait appropriate for age and unassisted Strength symmetric and appropriate for age.  Psych: Cognition and judgment appear intact.  Cooperative with normal attention span and concentration.  Behavior appropriate. No anxious or depressed appearing.     Assessment & Plan:    Assessment  Pre-diabetes HTN Hyperlipidemia Anxiety CAD s/p stents 2014  EtOH dependency Ulcerative colitis- last cscope 05-2015 GU: -Metastatic prostate cancer, DX 11-2015, radical prostatectomy 11-2015 --  continent as off  07-2016--on androgen depravation  -Hematuria, saw urology 01-2013 reviewed: CT show a renal cyst, cystoscopy negative, they recommended follow-up MRI for the renal cyst. MS  Dx 1991 (B neuritis, rx, resolved,  felt to be d/t MS) Bilateral thenar atrophy noted 06-2017 (apparently chronic) Nose bleed: R nostril artery cauterize 08-2017  PLAN Prediabetes: Check a A1c.  Patient is on steroids chronically. HTN: Currently on amlodipine 5 mg, carvedilol 12.5 twice a day, lisinopril HCT.  Controled?  Rec the same medications but monitor BPs, adjust meds PRN Hyperlipidemia: On Lipitor, checking labs CAD: Asymptomatic, RF nitroglycerin just in case EtOH: History of, drinking socially. Anxiety: Stress increase, patient is counseled, provided contact numbers for formal counseling and increase Zoloft to 50 mg (doesn't like to take more for now).  Reassess in 3 months RTC 3 months

## 2018-08-19 NOTE — Progress Notes (Signed)
Pre visit review using our clinic review tool, if applicable. No additional management support is needed unless otherwise documented below in the visit note. 

## 2018-08-19 NOTE — Assessment & Plan Note (Addendum)
Td 2015, pnm 23-2015; prevnar--2017;  S/p Shingrix #1 elsewhere; s/p flu s-hot -CCS S/p colonoscopy (~ 2011?) showed adenomatous polyps Cscope 05-2015: mild erythema, no polyps, no bx, next 5 years  -H/o Prostate cancer , per urology, responding very well with current treatments . -Labs :cmp, flp, tsh

## 2018-08-19 NOTE — Patient Instructions (Signed)
GO TO THE LAB : Get the blood work     GO TO THE FRONT DESK Schedule your next appointment for a checkup in 3 months  Increase Zoloft to 50 mg daily  Consider seeing a counselor  Check the  blood pressure every week Be sure your blood pressure is between 110/65 and  135/85. If it is consistently higher or lower, let me know

## 2018-08-20 ENCOUNTER — Other Ambulatory Visit: Payer: Self-pay | Admitting: Internal Medicine

## 2018-08-20 NOTE — Assessment & Plan Note (Signed)
Prediabetes: Check a A1c.  Patient is on steroids chronically. HTN: Currently on amlodipine 5 mg, carvedilol 12.5 twice a day, lisinopril HCT.  Controled?  Rec the same medications but monitor BPs, adjust meds PRN Hyperlipidemia: On Lipitor, checking labs CAD: Asymptomatic, RF nitroglycerin just in case EtOH: History of, drinking socially. Anxiety: Stress increase, patient is counseled, provided contact numbers for formal counseling and increase Zoloft to 50 mg (doesn't like to take more for now).  Reassess in 3 months RTC 3 months

## 2018-08-21 NOTE — Telephone Encounter (Signed)
Refill sent , see results

## 2018-08-21 NOTE — Telephone Encounter (Signed)
Pt needing refill on atorvastatin. Lipid panel completed yesterday. Okay to refill same dosage?

## 2018-08-25 ENCOUNTER — Encounter: Payer: Self-pay | Admitting: Internal Medicine

## 2018-08-25 MED ORDER — LEVOTHYROXINE SODIUM 88 MCG PO TABS: 88.0000 ug | ORAL_TABLET | Freq: Every day | ORAL | 1 refills | Status: DC

## 2018-09-07 ENCOUNTER — Other Ambulatory Visit: Payer: Self-pay | Admitting: Oncology

## 2018-09-07 DIAGNOSIS — C61 Malignant neoplasm of prostate: Secondary | ICD-10-CM

## 2018-09-15 ENCOUNTER — Observation Stay (HOSPITAL_COMMUNITY): Payer: BLUE CROSS/BLUE SHIELD

## 2018-09-15 ENCOUNTER — Encounter (HOSPITAL_COMMUNITY): Payer: Self-pay

## 2018-09-15 ENCOUNTER — Other Ambulatory Visit: Payer: Self-pay

## 2018-09-15 ENCOUNTER — Emergency Department (HOSPITAL_COMMUNITY): Payer: BLUE CROSS/BLUE SHIELD

## 2018-09-15 ENCOUNTER — Observation Stay (HOSPITAL_COMMUNITY)
Admission: EM | Admit: 2018-09-15 | Discharge: 2018-09-16 | Disposition: A | Discharge status: Home / Self Care | Payer: BLUE CROSS/BLUE SHIELD | Attending: Internal Medicine | Admitting: Internal Medicine

## 2018-09-15 ENCOUNTER — Encounter: Payer: Self-pay | Admitting: Internal Medicine

## 2018-09-15 ENCOUNTER — Ambulatory Visit (HOSPITAL_COMMUNITY)
Admission: RE | Admit: 2018-09-15 | Discharge: 2018-09-15 | Disposition: A | Discharge status: Home / Self Care | Payer: BLUE CROSS/BLUE SHIELD | Source: Home / Self Care | Attending: Psychiatry | Admitting: Psychiatry

## 2018-09-15 DIAGNOSIS — I10 Essential (primary) hypertension: Secondary | ICD-10-CM

## 2018-09-15 DIAGNOSIS — I160 Hypertensive urgency: Secondary | ICD-10-CM | POA: Insufficient documentation

## 2018-09-15 DIAGNOSIS — R930 Abnormal findings on diagnostic imaging of skull and head, not elsewhere classified: Secondary | ICD-10-CM | POA: Diagnosis present

## 2018-09-15 DIAGNOSIS — M899 Disorder of bone, unspecified: Secondary | ICD-10-CM

## 2018-09-15 DIAGNOSIS — R4182 Altered mental status, unspecified: Secondary | ICD-10-CM | POA: Diagnosis not present

## 2018-09-15 DIAGNOSIS — E876 Hypokalemia: Secondary | ICD-10-CM | POA: Diagnosis not present

## 2018-09-15 DIAGNOSIS — Z8546 Personal history of malignant neoplasm of prostate: Secondary | ICD-10-CM | POA: Insufficient documentation

## 2018-09-15 DIAGNOSIS — Z833 Family history of diabetes mellitus: Secondary | ICD-10-CM | POA: Insufficient documentation

## 2018-09-15 DIAGNOSIS — Z7982 Long term (current) use of aspirin: Secondary | ICD-10-CM | POA: Diagnosis not present

## 2018-09-15 DIAGNOSIS — F419 Anxiety disorder, unspecified: Secondary | ICD-10-CM | POA: Insufficient documentation

## 2018-09-15 DIAGNOSIS — R413 Other amnesia: Secondary | ICD-10-CM | POA: Diagnosis not present

## 2018-09-15 DIAGNOSIS — Z87891 Personal history of nicotine dependence: Secondary | ICD-10-CM | POA: Insufficient documentation

## 2018-09-15 DIAGNOSIS — G454 Transient global amnesia: Secondary | ICD-10-CM | POA: Diagnosis not present

## 2018-09-15 DIAGNOSIS — Z9889 Other specified postprocedural states: Secondary | ICD-10-CM | POA: Insufficient documentation

## 2018-09-15 DIAGNOSIS — E039 Hypothyroidism, unspecified: Secondary | ICD-10-CM | POA: Diagnosis not present

## 2018-09-15 DIAGNOSIS — Z8249 Family history of ischemic heart disease and other diseases of the circulatory system: Secondary | ICD-10-CM | POA: Insufficient documentation

## 2018-09-15 DIAGNOSIS — R410 Disorientation, unspecified: Secondary | ICD-10-CM | POA: Diagnosis not present

## 2018-09-15 DIAGNOSIS — Z79899 Other long term (current) drug therapy: Secondary | ICD-10-CM | POA: Diagnosis not present

## 2018-09-15 DIAGNOSIS — F332 Major depressive disorder, recurrent severe without psychotic features: Secondary | ICD-10-CM

## 2018-09-15 DIAGNOSIS — I2510 Atherosclerotic heart disease of native coronary artery without angina pectoris: Secondary | ICD-10-CM | POA: Diagnosis not present

## 2018-09-15 DIAGNOSIS — C61 Malignant neoplasm of prostate: Secondary | ICD-10-CM | POA: Diagnosis present

## 2018-09-15 LAB — RAPID URINE DRUG SCREEN, HOSP PERFORMED
Amphetamines: NOT DETECTED
Barbiturates: NOT DETECTED
Benzodiazepines: NOT DETECTED
Cocaine: NOT DETECTED
OPIATES: NOT DETECTED
Tetrahydrocannabinol: NOT DETECTED

## 2018-09-15 LAB — URINALYSIS, ROUTINE W REFLEX MICROSCOPIC
Bacteria, UA: NONE SEEN
Bilirubin Urine: NEGATIVE
Glucose, UA: NEGATIVE mg/dL
Ketones, ur: NEGATIVE mg/dL
Leukocytes, UA: NEGATIVE
NITRITE: NEGATIVE
Protein, ur: NEGATIVE mg/dL
Specific Gravity, Urine: 1.01 (ref 1.005–1.030)
pH: 8 (ref 5.0–8.0)

## 2018-09-15 LAB — COMPREHENSIVE METABOLIC PANEL
ALT: 39 U/L (ref 0–44)
ANION GAP: 13 (ref 5–15)
AST: 31 U/L (ref 15–41)
Albumin: 4.4 g/dL (ref 3.5–5.0)
Alkaline Phosphatase: 99 U/L (ref 38–126)
BUN: 8 mg/dL (ref 6–20)
CALCIUM: 9.3 mg/dL (ref 8.9–10.3)
CO2: 26 mmol/L (ref 22–32)
Chloride: 98 mmol/L (ref 98–111)
Creatinine, Ser: 0.85 mg/dL (ref 0.61–1.24)
GFR calc Af Amer: >60 mL/min (ref >60)
GFR calc non Af Amer: >60 mL/min (ref >60)
Glucose, Bld: 117 mg/dL — ABNORMAL HIGH (ref 70–99)
Potassium: 3.3 mmol/L — ABNORMAL LOW (ref 3.5–5.1)
Sodium: 137 mmol/L (ref 135–145)
Total Bilirubin: 1.3 mg/dL — ABNORMAL HIGH (ref 0.3–1.2)
Total Protein: 8.5 g/dL — ABNORMAL HIGH (ref 6.5–8.1)

## 2018-09-15 LAB — PROTIME-INR
INR: 0.96
Prothrombin Time: 12.7 seconds (ref 11.4–15.2)

## 2018-09-15 LAB — DIFFERENTIAL
Abs Immature Granulocytes: 0.01 10*3/uL (ref 0.00–0.07)
BASOS ABS: 0 10*3/uL (ref 0.0–0.1)
Basophils Relative: 1 %
EOS PCT: 2 %
Eosinophils Absolute: 0.1 10*3/uL (ref 0.0–0.5)
Immature Granulocytes: 0 %
Lymphocytes Relative: 34 %
Lymphs Abs: 2.3 10*3/uL (ref 0.7–4.0)
Monocytes Absolute: 0.4 10*3/uL (ref 0.1–1.0)
Monocytes Relative: 6 %
Neutro Abs: 3.8 10*3/uL (ref 1.7–7.7)
Neutrophils Relative %: 57 %

## 2018-09-15 LAB — CBC
HCT: 43.9 % (ref 39.0–52.0)
Hemoglobin: 14.8 g/dL (ref 13.0–17.0)
MCH: 27.2 pg (ref 26.0–34.0)
MCHC: 33.7 g/dL (ref 30.0–36.0)
MCV: 80.7 fL (ref 80.0–100.0)
Platelets: 204 10*3/uL (ref 150–400)
RBC: 5.44 MIL/uL (ref 4.22–5.81)
RDW: 13.2 % (ref 11.5–15.5)
WBC: 6.7 10*3/uL (ref 4.0–10.5)
nRBC: 0 % (ref 0.0–0.2)

## 2018-09-15 LAB — I-STAT TROPONIN, ED: Troponin i, poc: 0.01 ng/mL (ref 0.00–0.08)

## 2018-09-15 LAB — APTT: aPTT: 26 seconds (ref 24–36)

## 2018-09-15 LAB — ETHANOL: Alcohol, Ethyl (B): <10 mg/dL (ref <10)

## 2018-09-15 MED ORDER — ONDANSETRON HCL 4 MG PO TABS: 4.0000 mg | ORAL_TABLET | Freq: Four times a day (QID) | ORAL | Status: DC | PRN

## 2018-09-15 MED ORDER — ONDANSETRON HCL 4 MG/2ML IJ SOLN: 4.0000 mg | Freq: Four times a day (QID) | INTRAMUSCULAR | Status: DC | PRN

## 2018-09-15 MED ORDER — AMLODIPINE BESYLATE 5 MG PO TABS
5.0000 mg | ORAL_TABLET | Freq: Every day | ORAL | Status: DC
  Administered 2018-09-16: 5 mg via ORAL
  Filled 2018-09-15: qty 1

## 2018-09-15 MED ORDER — CARVEDILOL 12.5 MG PO TABS: 12.5000 mg | ORAL_TABLET | Freq: Two times a day (BID) | ORAL | Status: DC

## 2018-09-15 MED ORDER — SERTRALINE HCL 50 MG PO TABS
50.0000 mg | ORAL_TABLET | Freq: Every day | ORAL | Status: DC
  Administered 2018-09-16: 50 mg via ORAL
  Filled 2018-09-15: qty 1

## 2018-09-15 MED ORDER — ENOXAPARIN SODIUM 40 MG/0.4ML ~~LOC~~ SOLN
40.0000 mg | SUBCUTANEOUS | Status: DC
  Administered 2018-09-15: 40 mg via SUBCUTANEOUS
  Filled 2018-09-15: qty 0.4

## 2018-09-15 MED ORDER — ATORVASTATIN CALCIUM 10 MG PO TABS: 20.0000 mg | ORAL_TABLET | Freq: Every day | ORAL | Status: DC

## 2018-09-15 MED ORDER — ACETAMINOPHEN 325 MG PO TABS: 650.0000 mg | ORAL_TABLET | Freq: Four times a day (QID) | ORAL | Status: DC | PRN

## 2018-09-15 MED ORDER — HYDRALAZINE HCL 20 MG/ML IJ SOLN
5.0000 mg | INTRAMUSCULAR | Status: DC | PRN
  Administered 2018-09-15: 5 mg via INTRAVENOUS
  Filled 2018-09-15: qty 1

## 2018-09-15 MED ORDER — ACETAMINOPHEN 650 MG RE SUPP: 650.0000 mg | Freq: Four times a day (QID) | RECTAL | Status: DC | PRN

## 2018-09-15 MED ORDER — ASPIRIN 81 MG PO CHEW
81.0000 mg | CHEWABLE_TABLET | Freq: Every day | ORAL | Status: DC
  Administered 2018-09-16: 81 mg via ORAL
  Filled 2018-09-15: qty 1

## 2018-09-15 MED ORDER — NITROGLYCERIN 0.4 MG SL SUBL: 0.4000 mg | SUBLINGUAL_TABLET | SUBLINGUAL | Status: DC | PRN

## 2018-09-15 MED ORDER — PREDNISONE 5 MG PO TABS
5.0000 mg | ORAL_TABLET | Freq: Every day | ORAL | Status: DC
  Administered 2018-09-16: 5 mg via ORAL
  Filled 2018-09-15: qty 1

## 2018-09-15 MED ORDER — ABIRATERONE ACETATE 250 MG PO TABS: 1000.0000 mg | ORAL_TABLET | Freq: Every day | ORAL | Status: DC

## 2018-09-15 MED ORDER — LEVOTHYROXINE SODIUM 88 MCG PO TABS
88.0000 ug | ORAL_TABLET | ORAL | Status: DC
  Administered 2018-09-16: 88 ug via ORAL
  Filled 2018-09-15: qty 1

## 2018-09-15 MED ORDER — LISINOPRIL-HYDROCHLOROTHIAZIDE 20-12.5 MG PO TABS: 2.0000 | ORAL_TABLET | Freq: Every day | ORAL | Status: DC

## 2018-09-15 MED ORDER — LISINOPRIL 20 MG PO TABS
40.0000 mg | ORAL_TABLET | Freq: Every day | ORAL | Status: DC
  Administered 2018-09-16: 40 mg via ORAL
  Filled 2018-09-15: qty 2

## 2018-09-15 MED ORDER — HYDROCHLOROTHIAZIDE 25 MG PO TABS
25.0000 mg | ORAL_TABLET | Freq: Every day | ORAL | Status: DC
  Administered 2018-09-16: 25 mg via ORAL
  Filled 2018-09-15: qty 1

## 2018-09-15 NOTE — H&P (Signed)
History and Physical    Gary [name] NWG:[id number] DOB: [birth year] DOA: 09/15/2018  PCP: Colon Branch, MD  Patient coming from: Home.  Chief Complaint: Memory loss.  HPI: Gary [name] is a 61 y.o. male with history of prostate cancer, hypothyroidism, hypertension was noticed to have loss of memory since this afternoon.  Patient came to learn about that he is going to lose his job yesterday.  Patient was not able to think coherently and forgetting things and was brought to the ER.  Did not lose function of his extremities or have any visual symptoms or difficulty speaking or swallowing.  ED Course: In the ER CT head followed by MRI brain did not show anything acute.  On-call neurologist was consulted and patient is being admitted for possible transient global amnesia with a psychogenic fugue.  On my exam patient has become more alert awake oriented.  Review of Systems: As per HPI, rest all negative.   Past Medical History:  Diagnosis Date  . Anxiety   . CAD (coronary artery disease) 10-2012   h/o stents   . Elevated PSA    Prostate Bx w/ Dr. Tresa [name]  . EtOH dependence ([place])   . Family history of adverse reaction to anesthesia    made father disoriented  . Gross hematuria   . Hepatic steatosis   . Hyperlipidemia LDL goal < 70   . Hypertension   . Metastatic adenocarcinoma to prostate ([place]) 2017   Gleason score 9, metastasis to R external Iliac node and large extraprostatic extension at prostatectomy  . Multiple sclerosis ([place]) 1991   Bilateral optic neuritis, treated and resolved but felt to be a manifestation of MS.  . Renal cyst, left    Bosniak 2K (indeterminant) hyperdense cyst CT 2014, Stable CT in 2016  . Ulcerative colitis [facility])     Past Surgical History:  Procedure Laterality Date  . FOOT SURGERY     R toe Fx (screws),  L dorsal FX (screws)  . LEFT HEART CATHETERIZATION WITH CORONARY ANGIOGRAM N/A 10/22/2012   Procedure: LEFT HEART CATHETERIZATION WITH  CORONARY ANGIOGRAM;  Surgeon: Sinclair Grooms, MD;  Location: [facility] CATH LAB;  Service: Cardiovascular;  Laterality: N/A;  . LYMPHADENECTOMY Bilateral 11/24/2015   Procedure: BILATERAL PELVIC LYMPHADENECTOMY;  Surgeon: Alexis Frock, MD;  Location: [facility] ORS;  Service: Urology;  Laterality: Bilateral;  . PERCUTANEOUS CORONARY STENT INTERVENTION (PCI-S) N/A 10/23/2012   Procedure: PERCUTANEOUS CORONARY STENT INTERVENTION (PCI-S);  Surgeon: Sinclair Grooms, MD;  Location[facility] CATH LAB;  Service: Cardiovascular;  Laterality: N/A;  . PROSTATE BIOPSY  09/2015   Dr. Tresa [name]  . ROBOT ASSISTED LAPAROSCOPIC RADICAL PROSTATECTOMY N/A 11/24/2015   Procedure: XI ROBOTIC ASSISTED LAPAROSCOPIC RADICAL PROSTATECTOMY WITH INDOCYANINE GREEN DYE;  Surgeon: Alexis Frock, MD;  Location: [facility] ORS;  Service: Urology;  Laterality: N/A;  . VASECTOMY       reports that he quit smoking about 28 years ago. He has quit using smokeless tobacco. He reports that he drinks alcohol. He reports that he has current or past drug history. Drug: Marijuana.  No Known Allergies  Family History  Problem Relation Age of Onset  . Dementia Mother   . Hypertension Mother   . Coronary artery disease Father   . Diabetes Mellitus II Father   . Lupus Sister   . Coronary artery disease Brother   . Diabetes Brother   . Hypertension Brother   . Prostate cancer Brother   . Thyroid cancer  Sister   . Heart failure Sister   . Colon cancer Neg Hx     Prior to Admission medications   Medication Sig Start Date End Date Taking? Authorizing Provider  amLODipine (NORVASC) 5 MG tablet Take 1 tablet (5 mg total) by mouth daily. 08/14/18  Yes Colon Branch, MD  aspirin 81 MG tablet Take 81 mg by mouth daily.   Yes [provider]  atorvastatin (LIPITOR) 20 MG tablet TAKE 1 TABLET BY MOUTH  DAILY Patient taking differently: Take 20 mg by mouth daily.  08/21/18  Yes Paz, [name], MD  carvedilol (COREG) 12.5 MG tablet Take 1 tablet (12.5 mg total)  by mouth 2 (two) times daily with a meal. 08/14/18  Yes Paz, [name], MD  levothyroxine (SYNTHROID, LEVOTHROID) 88 MCG tablet Take 1 tablet (88 mcg total) by mouth daily before breakfast. 08/25/18  Yes Paz, [name], MD  lisinopril-hydrochlorothiazide (PRINZIDE,ZESTORETIC) 20-12.5 MG tablet Take 2 tablets by mouth daily. 08/11/18  Yes Paz, [name], MD  nitroGLYCERIN (NITROSTAT) 0.4 MG SL tablet Place 1 tablet (0.4 mg total) under the tongue every 5 (five) minutes as needed for chest pain. 08/19/18  Yes Paz, [name], MD  oxymetazoline (NASAL DECONGESTANT SPRAY) 0.05 % nasal spray Place 2 sprays into both nostrils 2 (two) times daily as needed for congestion. As directed 08/01/17  Yes [provider]  sertraline (ZOLOFT) 50 MG tablet Take 1 tablet (50 mg total) by mouth daily. 08/19/18  Yes Paz, [name], MD  sodium chloride (OCEAN) 0.65 % SOLN nasal spray Place 1 spray into both nostrils as needed for congestion.   Yes [provider]  ZYTIGA 250 MG tablet TAKE 4 TABLETS BY MOUTH  ONCE DAILY ON AN EMPTY  STOMACH 1 HOUR BEFORE OR 2  HOURS AFTER A MEAL Patient taking differently: Take 1,000 mg by mouth daily.  09/07/18  Yes Wyatt Portela, MD  ferrous sulfate 325 (65 FE) MG tablet Take 1 tablet (325 mg total) 2 (two) times daily with a meal by mouth. Patient not taking: Reported on 09/15/2018 08/11/17   Colon Branch, MD  hydrOXYzine (ATARAX/VISTARIL) 25 MG tablet Take 1 tablet (25 mg total) every 6 (six) hours by mouth. Patient not taking: Reported on 09/15/2018 08/17/17   Carlisle Cater, PA-C  meloxicam [facility]) 7.5 MG tablet take 1 tablet by mouth once daily Patient not taking: Reported on 08/19/2018 10/04/16   Saguier, Percell Miller, PA-C  predniSONE (DELTASONE) 5 MG tablet Take 1 tablet (5 mg total) by mouth daily with breakfast. Patient not taking: Reported on 09/15/2018 12/04/17   Wyatt Portela, MD    Physical Exam: Vitals:   09/15/18 1830 09/15/18 1850 09/15/18 1953 09/15/18 2207  BP: (!)  174/84 (!) 170/83 (!) 159/79 (!) 174/86  Pulse: 82 (!) 51 (!) 54   Resp: 18 15 17    Temp:    98.5 F (36.9 C)  TempSrc:    Oral  SpO2: 100% 95% 94% 95%  Weight:    111.7 kg  Height:    6' (1.829 m)      Constitutional: Moderately built and nourished. Vitals:   09/15/18 1830 09/15/18 1850 09/15/18 1953 09/15/18 2207  BP: (!) 174/84 (!) 170/83 (!) 159/79 (!) 174/86  Pulse: 82 (!) 51 (!) 54   Resp: 18 15 17    Temp:    98.5 F (36.9 C)  TempSrc:    Oral  SpO2: 100% 95% 94% 95%  Weight:    111.7  kg  Height:    6' (1.829 m)   Eyes: Anicteric no pallor. ENMT: No discharge from the ears eyes nose or mouth. Neck: No mass felt.  No neck rigidity. Respiratory: No rhonchi or crepitations. Cardiovascular: S1-S2 heard. Abdomen: Soft nontender bowel sounds present. Musculoskeletal: No edema.  No joint effusion. Skin: No rash. Neurologic: Alert awake oriented to time place and person.  Moves all extremities. Psychiatric: Denies any suicidal ideation.   Labs on Admission: I have personally reviewed following labs and imaging studies  CBC: Recent Labs  Lab 09/15/18 1756  WBC 6.7  NEUTROABS 3.8  HGB 14.8  HCT 43.9  MCV 80.7  PLT 297   Basic Metabolic Panel: Recent Labs  Lab 09/15/18 1756  NA 137  K 3.3*  CL 98  CO2 26  GLUCOSE 117*  BUN 8  CREATININE 0.85  CALCIUM 9.3   GFR: Estimated Creatinine Clearance: 120.7 mL/min (by C-G formula based on SCr of 0.85 mg/dL). Liver Function Tests: Recent Labs  Lab 09/15/18 1756  AST 31  ALT 39  ALKPHOS 99  BILITOT 1.3*  PROT 8.5*  ALBUMIN 4.4   No results for input(s): LIPASE, AMYLASE in the last 168 hours. No results for input(s): AMMONIA in the last 168 hours. Coagulation Profile: Recent Labs  Lab 09/15/18 1756  INR 0.96   Cardiac Enzymes: No results for input(s): CKTOTAL, CKMB, CKMBINDEX, TROPONINI in the last 168 hours. BNP (last 3 results) No results for input(s): PROBNP in the last 8760 hours. HbA1C: No  results for input(s): HGBA1C in the last 72 hours. CBG: No results for input(s): GLUCAP in the last 168 hours. Lipid Profile: No results for input(s): CHOL, HDL, LDLCALC, TRIG, CHOLHDL, LDLDIRECT in the last 72 hours. Thyroid Function Tests: No results for input(s): TSH, T4TOTAL, FREET4, T3FREE, THYROIDAB in the last 72 hours. Anemia Panel: No results for input(s): VITAMINB12, FOLATE, FERRITIN, TIBC, IRON, RETICCTPCT in the last 72 hours. Urine analysis:    Component Value Date/Time   COLORURINE STRAW (A) 09/15/2018 1813   APPEARANCEUR CLEAR 09/15/2018 1813   LABSPEC 1.010 09/15/2018 1813   PHURINE 8.0 09/15/2018 1813   GLUCOSEU NEGATIVE 09/15/2018 1813   GLUCOSEU NEGATIVE 07/12/2015 0831   HGBUR SMALL (A) 09/15/2018 1813   BILIRUBINUR NEGATIVE 09/15/2018 1813   KETONESUR NEGATIVE 09/15/2018 1813   PROTEINUR NEGATIVE 09/15/2018 1813   UROBILINOGEN 0.2 07/12/2015 0831   NITRITE NEGATIVE 09/15/2018 1813   LEUKOCYTESUR NEGATIVE 09/15/2018 1813   Sepsis Labs: @LABRCNTIP (procalcitonin:4,lacticidven:4) )No results found for this or any previous visit (from the past 240 hour(s)).   Radiological Exams on Admission: Ct Head Wo Contrast  Result Date: 09/15/2018 CLINICAL DATA:  Memory loss.  History of prostate carcinoma EXAM: CT HEAD WITHOUT CONTRAST TECHNIQUE: Contiguous axial images were obtained from the base of the skull through the vertex without intravenous contrast. COMPARISON:  None. FINDINGS: Brain: The ventricles are normal in size and configuration. There is no evident intracranial mass, hemorrhage, extra-axial fluid collection, or midline shift. Brain parenchyma appears unremarkable. No acute infarct is demonstrable. Vascular: There is no appreciable hyperdense vessel. There is calcification in each carotid siphon region. Skull: There are scattered sclerotic lesions in the calvarium. No evident fracture. No lytic or destructive lesions evident. Sinuses/Orbits: There is mucosal  thickening in several ethmoid air cells. Other paranasal sinuses are clear. Orbits appear symmetric bilaterally. Other: Mastoid air cells are clear. IMPRESSION: 1. Brain parenchyma appears unremarkable. No acute infarct evident. No intracranial mass or hemorrhage. 2. Sclerotic foci within  the bony calvarium, concerning for prostate carcinoma metastases. 3.  Foci of arterial vascular calcification. 4.  Mucosal thickening in several ethmoid air cells noted. Electronically Signed   By: Lowella Grip III M.D.   On: 09/15/2018 18:30   Mr Brain Wo Contrast  Result Date: 09/15/2018 CLINICAL DATA:  Altered mental status. Hypertensive. Acute onset memory loss and confusion. History of metastatic prostate cancer, alcohol dependence, hypertension, hyperlipidemia. EXAM: MRI HEAD WITHOUT CONTRAST TECHNIQUE: Multiplanar, multiecho pulse sequences of the brain and surrounding structures were obtained without intravenous contrast. COMPARISON:  CT HEAD September 15, 2018 FINDINGS: INTRACRANIAL CONTENTS: No reduced diffusion to suggest acute ischemia. No susceptibility artifact to suggest hemorrhage. The ventricles and sulci are normal for patient's age. A few nonspecific punctate supratentorial white matter FLAIR T2 hyperintensities are normal for age. No suspicious parenchymal signal, masses, mass effect. No abnormal extra-axial fluid collections. No extra-axial masses. VASCULAR: Normal major intracranial vascular flow voids present at skull base. SKULL AND UPPER CERVICAL SPINE: No abnormal sellar expansion. No suspicious calvarial bone marrow signal. Craniocervical junction maintained. SINUSES/ORBITS: The mastoid air-cells and included paranasal sinuses are well-aerated.The included ocular globes and orbital contents are non-suspicious. OTHER: None. IMPRESSION: Normal MRI head without contrast for age. Electronically Signed   By: Elon Alas M.D.   On: 09/15/2018 21:44   Dg Chest Portable 1 View  Result Date:  09/15/2018 CLINICAL DATA:  Prostate carcinoma.  Pre MRI EXAM: PORTABLE CHEST 1 VIEW COMPARISON:  October 22, 2012 FINDINGS: Lungs are clear. Heart is upper normal in size with pulmonary vascularity normal. No adenopathy. No pacemaker evident. There is a coronary stent on the left. There is aortic atherosclerosis. No blastic or lytic bone lesions. No metallic foreign body evident. IMPRESSION: Lungs clear. Cardiac silhouette within normal limits. There is aortic atherosclerosis. No pacemaker or metallic foreign body appreciable on this study. Aortic Atherosclerosis (ICD10-I70.0). Electronically Signed   By: Lowella Grip III M.D.   On: 09/15/2018 21:09    EKG: Independently reviewed.  Sinus bradycardia heart rate around 54 bpm.  Assessment/Plan Principal Problem:   Transient global amnesia Active Problems:   Hypertension, essential   Prostate cancer ([place])   Hypothyroidism   Hypertensive urgency    1. Transient global amnesia versus psychogenic fugue -discussed with Dr. Leonel Ramsay on-call neurologist.  At this time will be getting EEG.  And continue to observe.  Patient's mentation is slowly improving. 2. Hypertensive urgency in addition to patient's home medications including lisinopril hydrochlorothiazide Coreg and amlodipine will keep patient on PRN IV hydralazine. 3. Mild hypokalemia likely from HCTZ use.  Replace recheck.  Check magnesium. 4. Hypothyroidism on Synthroid.  Check TSH. 5. History of prostate cancer on Zytiga and prednisone. 6. Hyperlipidemia on statins. 7. History of depression on Zoloft.   DVT prophylaxis: Lovenox. Code Status: Full code. Family Communication: Discussed with patient. Disposition Plan: Home. Consults called: Neurology. Admission status: Observation.   Rise Patience MD [facility] Pager [phone].  If 7PM-7AM, please contact night-coverage [email or web address] Password [email or web address]  09/15/2018, 10:20 PM

## 2018-09-15 NOTE — BH Assessment (Signed)
Assessment Note  Gary [name] is a married 62 y.o. male who presents voluntarily to [facility] for a walk-in assessment to meet with this Probation officer and Earleen Newport, NP. Pt reports symptoms of depression with vague suicidal ideation. Pt didn't answer whether or not he had a plan to harm himself, he just asked, "why would I answer that?"  Pt presents with a folder that includes the details of the severance package from his employer of more than 35 years [employer]). Pt explained he was offered a lesser position for less money. Pt & his wife have discussed situation and have a plan. Pt is having a lot of anxiety with this change. He also reports recent health problems for him (prostate cancer, heart stint, & high blood pressure) and his wife (MS, and a recent hip surgery). Pt was requesting a plan to help him manage his stress.  As details were being discussed, pt looked up and said "I'm sorry, but I have no idea what you're talking about". We recapped a bit. But pt restated he had no idea what we were talking about, or what was going on or where he was. Pt then became obviously scared and tearful. This Probation officer and Earleen Newport, NP, offered reassurance & answered pt's questions. Pt did not remember where he was, that his wife had hip surgery, that he lost his job, how he got to the hospital, what year it was, & who is the Software engineer. And he could not retain this information when told.  Pt's spouse was called with pt able to speak to her, & we let her know that pt would be going to the ED for evaluation after an episode of abrupt memory loss. Wife stated she knew pt was under tremendous stress & was supportive to pt. She stated she would meet pt at the ED.   Pt reports no previous MH tx.   MSE (prior to memory loss event): Pt is casually dressed, alert, oriented x4 with normal speech and normal motor behavior. Eye contact was good. Pt's mood was pleasant, preoccupied, and anxious and affect is depressed and anxious.  Affect is congruent with mood. Thought process is coherent and relevant. Pt was cooperative throughout assessment.    Disposition:  Earleen Newport, NP recommends psychiatric hospitalization  Diagnosis: F33.2 Major depressive disorder, recurrent, severe without psychotic features  Past Medical History:  Past Medical History:  Diagnosis Date  . Anxiety   . CAD (coronary artery disease) 10-2012   h/o stents   . Elevated PSA    Prostate Bx w/ Dr. Tresa [name]  . EtOH dependence ([place])   . Family history of adverse reaction to anesthesia    made father disoriented  . Gross hematuria   . Hepatic steatosis   . Hyperlipidemia LDL goal < 70   . Hypertension   . Metastatic adenocarcinoma to prostate ([place]) 2017   Gleason score 9, metastasis to R external Iliac node and large extraprostatic extension at prostatectomy  . Multiple sclerosis ([place]) 1991   Bilateral optic neuritis, treated and resolved but felt to be a manifestation of MS.  . Renal cyst, left    Bosniak 2K (indeterminant) hyperdense cyst CT 2014, Stable CT in 2016  . Ulcerative colitis [facility])     Past Surgical History:  Procedure Laterality Date  . FOOT SURGERY     R toe Fx (screws),  L dorsal FX (screws)  . LEFT HEART CATHETERIZATION WITH CORONARY ANGIOGRAM N/A 10/22/2012   Procedure: LEFT HEART  CATHETERIZATION WITH CORONARY ANGIOGRAM;  Surgeon: Sinclair Grooms, MD;  Location: [facility] CATH LAB;  Service: Cardiovascular;  Laterality: N/A;  . LYMPHADENECTOMY Bilateral 11/24/2015   Procedure: BILATERAL PELVIC LYMPHADENECTOMY;  Surgeon: Alexis Frock, MD;  Location: [facility] ORS;  Service: Urology;  Laterality: Bilateral;  . PERCUTANEOUS CORONARY STENT INTERVENTION (PCI-S) N/A 10/23/2012   Procedure: PERCUTANEOUS CORONARY STENT INTERVENTION (PCI-S);  Surgeon: Sinclair Grooms, MD;  Location[facility] CATH LAB;  Service: Cardiovascular;  Laterality: N/A;  . PROSTATE BIOPSY  09/2015   Dr. Tresa [name]  . ROBOT ASSISTED LAPAROSCOPIC RADICAL PROSTATECTOMY N/A  11/24/2015   Procedure: XI ROBOTIC ASSISTED LAPAROSCOPIC RADICAL PROSTATECTOMY WITH INDOCYANINE GREEN DYE;  Surgeon: Alexis Frock, MD;  Location: [facility] ORS;  Service: Urology;  Laterality: N/A;  . VASECTOMY      Family History:  Family History  Problem Relation Age of Onset  . Dementia Mother   . Hypertension Mother   . Coronary artery disease Father   . Diabetes Mellitus II Father   . Lupus Sister   . Coronary artery disease Brother   . Diabetes Brother   . Hypertension Brother   . Prostate cancer Brother   . Thyroid cancer Sister   . Heart failure Sister   . Colon cancer Neg Hx     Social History:  reports that he quit smoking about 28 years ago. He has quit using smokeless tobacco. He reports that he drinks alcohol. He reports that he has current or past drug history. Drug: Marijuana.  Additional Social History:  Alcohol / Drug Use Pain Medications: See MAR Prescriptions: See MAR Over the Counter: See MAR  CIWA: CIWA-Ar BP: (!) 230/118 COWS:    Allergies: No Known Allergies  Home Medications:  (Not in a hospital admission)  OB/GYN Status:  No LMP for male patient.  General Assessment Data Location of Assessment: [facility] Assessment Services TTS Assessment: In system Is this a Tele or Face-to-Face Assessment?: Face-to-Face Is this an Initial Assessment or a Re-assessment for this encounter?: Initial Assessment Patient Accompanied by:: N/A Language Other than English: No Living Arrangements: Other (Comment) What gender do you identify as?: Male Marital status: Married Living Arrangements: Spouse/significant other([place] Emergency planning/management officer) Can pt return to current living arrangement?: Yes Admission Status: Voluntary Is patient capable of signing voluntary admission?: Yes Referral Source: Self/Family/Friend Insurance type: Insurance risk surveyor Exam ([place]) Medical Exam completed: Yes  Crisis Care Plan Living Arrangements: Spouse/significant other([place]  Emergency planning/management officer) Name of Psychiatrist: none Name of Therapist: none  Education Status Is patient currently in school?: No Is the patient employed, unemployed or receiving disability?: Employed([employer])  Risk to self with the past 6 months Suicidal Ideation: Yes-Currently Present Has patient been a risk to self within the past 6 months prior to admission? : No Suicidal Intent: No Has patient had any suicidal intent within the past 6 months prior to admission? : No Is patient at risk for suicide?: Yes Suicidal Plan?: (pt stated "why would I say if I did?") Has patient had any suicidal plan within the past 6 months prior to admission? : Other (comment)(unsure) Access to Means: (unknown) What has been your use of drugs/alcohol within the last 12 months?: (UTA) Previous Attempts/Gestures: (UTA) How many times?: (UTA) Other Self Harm Risks: Depression, significant recent losses Triggers for Past Attempts: Unknown Intentional Self Injurious Behavior: (unknown) Family Suicide History: Unable to assess Recent stressful life event(s): Financial Problems, Job Loss, Recent negative physical changes Persecutory voices/beliefs?: No Depression: Yes Depression Symptoms: Despondent,  Tearfulness, Isolating, Fatigue, Loss of interest in usual pleasures, Feeling worthless/self pity, Feeling angry/irritable Substance abuse history and/or treatment for substance abuse?: (UTA) Suicide prevention information given to non-admitted patients: Not applicable  Risk to Others within the past 6 months Homicidal Ideation: No Does patient have any lifetime risk of violence toward others beyond the six months prior to admission? : No Thoughts of Harm to Others: No Current Homicidal Intent: No Current Homicidal Plan: No Access to Homicidal Means: (UTA) History of harm to others?: (UTA) Assessment of Violence: (UTA) Does patient have access to weapons?: (UTA) Criminal Charges Pending?: (UTA) Does patient have a court  date: (UTA) Is patient on probation?: (UTA)  Psychosis Hallucinations: (UTA) Delusions: (UTA)  Mental Status Report Appearance/Hygiene: Unremarkable Eye Contact: Good Motor Activity: Freedom of movement Speech: Logical/coherent, Unremarkable Level of Consciousness: Alert Mood: Anxious, Pleasant Affect: Depressed, Frightened, Anxious Anxiety Level: Moderate Thought Processes: Coherent, Thought Blocking Judgement: Impaired Orientation: Person, Not oriented Obsessive Compulsive Thoughts/Behaviors: None  Cognitive Functioning Concentration: Decreased Memory: Remote Intact, Recent Impaired Is patient IDD: No Insight: Fair Impulse Control: Good Appetite: Fair Have you had any weight changes? : (UTA) Sleep: Unable to Assess  ADLScreening [facility] Assessment Services) Patient's cognitive ability adequate to safely complete daily activities?: Yes Patient able to express need for assistance with ADLs?: Yes Independently performs ADLs?: Yes (appropriate for developmental age)  Prior Inpatient Therapy Prior Inpatient Therapy: No  Prior Outpatient Therapy Prior Outpatient Therapy: No Does patient have an ACCT team?: No Does patient have Intensive In-House Services?  : No Does patient have [facility] services? : No Does patient have P4CC services?: No  ADL Screening (condition at time of admission) Patient's cognitive ability adequate to safely complete daily activities?: Yes Is the patient deaf or have difficulty hearing?: No Does the patient have difficulty seeing, even when wearing glasses/contacts?: No Does the patient have difficulty concentrating, remembering, or making decisions?: No Patient able to express need for assistance with ADLs?: Yes Does the patient have difficulty dressing or bathing?: No Independently performs ADLs?: Yes (appropriate for developmental age) Does the patient have difficulty walking or climbing stairs?: No Weakness of Legs: None Weakness of  Arms/Hands: None  Home Assistive Devices/Equipment Home Assistive Devices/Equipment: None  Therapy Consults (therapy consults require a physician order) PT Evaluation Needed: No OT Evalulation Needed: No SLP Evaluation Needed: No Abuse/Neglect Assessment (Assessment to be complete while patient is alone) Abuse/Neglect Assessment Can Be Completed: Unable to assess, patient is non-responsive or altered mental status                Disposition:  Earleen Newport, NP recommends psychiatric hospitalization Disposition Initial Assessment Completed for this Encounter: Yes Disposition of Patient: Admit Type of inpatient treatment program: Adult  On Site Evaluation by:   Reviewed with Physician:    Richardean Chimera 09/15/2018 7:07 PM

## 2018-09-15 NOTE — H&P (Signed)
Behavioral Health Medical Screening Exam  Gary [name] is an 62 y.o. male patient presents to [facility] as walk in with complaints of feeling overwhelmed and stress.  Patient reports that he has been working at [employer] for 34 years and was told yesterday lay starting at the beginning of the year; or he could take a job doing what he is currently doing for less pay.  Patient states that his wife recently had hip surgery and he has also been having to help her out since she hasn't been able to do much for her self.  States that he has a history of prostate cancer; prostate removed but metastasis and is still taking medication.  "I just got a lot going on with my wife having hip surgery and MS; getting layed off, not knowing what we're going to do for insurance; Can't have sex like I use to; it's just to much." Patient went to bathroom and when he came back he did not know where he was or why he was here.  Patient was able to give his name and date of birth; was able to give his wife and sons' name.  All of the information given at beginning of interview patient could not remember.   Spoke with charge nurse Roselyn Reef, RN) at [facility] update on patient given and that patient being sent to ED  Total Time spent with patient: 1 hour  Psychiatric Specialty Exam: Physical Exam  Constitutional: He appears well-developed and well-nourished.  Neck: Normal range of motion. Neck supple.  Respiratory: Effort normal.  Neurological:  Patient presented alert/oriented x4 but under a lot of stress and feeling very overwhelmed.  Patient up to bathroom to void and when he came back; he did not know where he was, current year, why he was here.  He could only give his date of birth, name of his wife, and son.    Skin: Skin is warm and dry.    ROS  Blood pressure (!) 230/118, SpO2 99 %.There is no height or weight on file to calculate BMI.  General Appearance: Casual, Neat and Well Groomed  Eye Contact:  Good   Speech:  Clear and Coherent and Normal Rate  Volume:  Normal  Mood:  Anxious, Depressed and Hopeless  Affect:  Depressed  Thought Process:  Disorganized  Orientation:  Other:  Only name  Thought Content:  Patient denied hallucinations, delusions, and paranoia  Suicidal Thoughts:  No  Homicidal Thoughts:  No  Memory:  Immediate;   Poor Recent;   Poor Remote;   Poor  Judgement:  Impaired  Insight:  Fair  Psychomotor Activity:  Normal  Concentration: Concentration: Fair and Attention Span: Fair  Recall:  Poor  Fund of Knowledge:Good  Language: Good  Akathisia:  No  Handed:  Right  AIMS (if indicated):     Assets:  Communication Skills Desire for Improvement Housing Social Support  Sleep:       Musculoskeletal: Strength & Muscle Tone: within normal limits Gait & Station: normal Patient leans: N/A  Blood pressure (!) 230/118, SpO2 99 %.  Recommendations:  Patient sent to ED related to blood pressure and to rule out cardiac and neurological problems.  Inpatient psychiatric treatment when medically cleared. Disposition: Recommend psychiatric Inpatient admission when medically cleared.  Based on my evaluation the patient appears to have an emergency medical condition for which I recommend the patient be transferred to the emergency department for further evaluation.  Shuvon Rankin, NP 09/15/2018,  5:00 PM

## 2018-09-15 NOTE — ED Notes (Signed)
Pt. To MRI via stretcher.

## 2018-09-15 NOTE — Consult Note (Addendum)
Neurology Consultation Reason for Consult: Memory difficulty Referring Physician: Carlisle Cater  CC: Memory difficulty  History is obtained from: Patient, family  HPI: Gary [name] is a 62 y.o. male who was in his normal state of health until yesterday at which point he found out that he was going to be losing his job.  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  Over the course of his evaluation, however he began having memory difficulty and repeating the same questions over and over again.  He was unable to tell them how he arrived to his appointment and therefore he was transferred to [facility] for further evaluation.  On my exam, he asks if all his brothers and sisters are still living to which the answer is no and he becomes tearful about this fact.  He also when I mentioned that he had received some stressful news becomes insistent on being told he becomes tearful after being told about losing his job.  Apparently there are some major events of the past few years which he is not clearly remembering.   He repeatedly did not recognize the ED provider who is taking care of him, and has repeatedly asked why he was here and what happened and if he passed out.  ROS: A 14 point ROS was performed and is negative except as noted in the HPI.   Past Medical History:  Diagnosis Date  . Anxiety   . CAD (coronary artery disease) 10-2012   h/o stents   . Elevated PSA    Prostate Bx w/ Dr. Tresa [name]  . EtOH dependence ([place])   . Family history of adverse reaction to anesthesia    made father disoriented  . Gross hematuria   . Hepatic steatosis   . Hyperlipidemia LDL goal < 70   . Hypertension   . Metastatic adenocarcinoma to  prostate ([place]) 2017   Gleason score 9, metastasis to R external Iliac node and large extraprostatic extension at prostatectomy  . Multiple sclerosis ([place]) 1991   Bilateral optic neuritis, treated and resolved but felt to be a manifestation of MS.  . Renal cyst, left    Bosniak 2K (indeterminant) hyperdense cyst CT 2014, Stable CT in 2016  . Ulcerative colitis ([place])      Family History  Problem Relation Age of Onset  . Dementia Mother   . Hypertension Mother   . Coronary artery disease Father   . Diabetes Mellitus II Father   . Lupus Sister   . Coronary artery disease Brother   . Diabetes Brother   . Hypertension Brother   . Prostate cancer Brother   . Thyroid cancer Sister   . Heart failure Sister   . Colon cancer Neg Hx      Social History:  reports that he quit smoking about 28 years ago. He has quit using smokeless tobacco. He reports that he drinks alcohol. He reports that he has current or past drug history. Drug: Marijuana.   Exam: Current vital signs: BP (!) 159/79 (BP Location: Right Arm)   Pulse (!) 54   Temp 98.1 F (36.7 C) (Oral)   Resp 17   SpO2 94%  Vital  signs in last 24 hours: Temp:  [98.1 F (36.7 C)] 98.1 F (36.7 C) (12/10 1743) Pulse Rate:  [51-82] 54 (12/10 1953) Resp:  [15-19] 17 (12/10 1953) BP: (159-177)/(79-84) 159/79 (12/10 1953) SpO2:  [94 %-100 %] 94 % (12/10 1953)   Physical Exam  Constitutional: Appears well-developed and well-nourished.  Psych: Affect appropriate to situation Eyes: No scleral injection HENT: No OP obstrucion Head: Normocephalic.  Cardiovascular: Normal rate and regular rhythm.  Respiratory: Effort normal, non-labored breathing GI: Soft.  No distension. There is no tenderness.  Skin: WDI  Neuro: Mental Status: Patient is awake, alert, oriented to person, place, month, year, and situation. No signs of aphasia or neglect He is able to spell world backwards He is unable to give simple answers to things like  how he got to the hospital or what he was doing here.  He becomes tearful when told about losing his job. Cranial Nerves: II: Visual Fields are full. Pupils are equal, round, and reactive to light.   III,IV, VI: EOMI without ptosis or diploplia.  V: Facial sensation is symmetric to temperature VII: Facial movement is symmetric.  VIII: hearing is intact to voice X: Uvula elevates symmetrically XI: Shoulder shrug is symmetric. XII: tongue is midline without atrophy or fasciculations.  Motor: Tone is normal. Bulk is normal. 5/5 strength was present in all four extremities.  Sensory: Sensation is symmetric to light touch and temperature in the arms and legs. Cerebellar: FNF and HKS are intact bilaterally  I have reviewed labs in epic and the results pertinent to this consultation are: CMP-unremarkable  I have reviewed the images obtained: CT head-unremarkable intracranially, there does appear to be concern for a lesion of the skull  Impression: 62 year old male with anterograde >retrograde amnesia.  This is most consistent with transient global amnesia, however the lack of memory of certain life events over the past few years does raise the possibility of psychogenic fugue.  Transient global amnesia is associated with major life stressors and therefore the presence of stress is not helpful in differentiating these two.  At this time given that he is not back to baseline, I do think that admission for observation with MRI and EEG would be helpful.  Recommendations: 1) MRI brain 2) EEG 3) admit for observation   Roland Rack, MD [facility] [phone]  If 7pm- 7am, please page neurology on call as listed in [facility].

## 2018-09-15 NOTE — ED Provider Notes (Signed)
[facility]ERG[facility] DEPARTMENT Provider Note   CSN: [id number] Arrival date & time: 09/15/18  1737     History   Chief Complaint Chief Complaint  Patient presents with  . Memory Loss    HPI DANDRA VELARDI is a 62 y.o. male.  Patient presents with altered mental status.  Patient presents from behavioral health walk-in clinic.  There he was found to have very high blood pressure.  He reported there with complaint of increased stress because of loss of job.  While there, he developed acute memory loss and was confused.  He was sent to the emergency department for further evaluation.  Per nurse practitioner note, recommended inpatient treatment once medically cleared.  Patient has a history of metastatic prostate cancer, previous coronary artery disease, alcohol use.  Level 5 caveat due to altered level of consciousness.     Past Medical History:  Diagnosis Date  . Anxiety   . CAD (coronary artery disease) 10-2012   h/o stents   . Elevated PSA    Prostate Bx w/ Dr. Tresa [name]  . EtOH dependence ([place])   . Family history of adverse reaction to anesthesia    made father disoriented  . Gross hematuria   . Hepatic steatosis   . Hyperlipidemia LDL goal < 70   . Hypertension   . Metastatic adenocarcinoma to prostate ([place]) 2017   Gleason score 9, metastasis to R external Iliac node and large extraprostatic extension at prostatectomy  . Multiple sclerosis ([place]) 1991   Bilateral optic neuritis, treated and resolved but felt to be a manifestation of MS.  . Renal cyst, left    Bosniak 2K (indeterminant) hyperdense cyst CT 2014, Stable CT in 2016  . Ulcerative colitis [place])     Patient Active Problem List   Diagnosis Date Noted  . Epistaxis 08/07/2017  . Anxiety and depression 06/25/2017  . Hypothyroidism 06/24/2017  . Prostate cancer ([place]) 11/24/2015  . PCP NOTES >>> 07/12/2015  . Annual physical exam 07/08/2014  . Elevated LFTs 07/08/2014  . EtOH dependence  ([place]) 02/15/2014  . Hyperglycemia 02/15/2014  . Ulcerative colitis ([place]) 10/22/2012    Class: Chronic  . Hypertension, essential 10/22/2012    Class: Chronic  . CAD in native artery 10/22/2012    Class: Acute  . Hyperlipidemia 10/22/2012  . H/O multiple sclerosis 10/22/2012    Past Surgical History:  Procedure Laterality Date  . FOOT SURGERY     R toe Fx (screws),  L dorsal FX (screws)  . LEFT HEART CATHETERIZATION WITH CORONARY ANGIOGRAM N/A 10/22/2012   Procedure: LEFT HEART CATHETERIZATION WITH CORONARY ANGIOGRAM;  Surgeon: Sinclair Grooms, MD;  Location[facility] C[facility] LAB;  Service: Cardiovascular;  Laterality: N/A;  . LYMPHADENECTOMY Bilateral 11/24/2015   Procedure: BILATERAL PELVIC LYMPHADENECTOMY;  Surgeon: Alexis Frock, MD;  Location: [facility] ORS;  Service: Urology;  Laterality: Bilateral;  . PERCUTANEOUS CORONARY STENT INTERVENTION (PCI-S) N/A 10/23/2012   Procedure: PERCUTANEOUS CORONARY STENT INTERVENTION (PCI-S);  Surgeon: Sinclair Grooms, MD;  Location: [facility] CATH LAB;  Service: Cardiovascular;  Laterality: N/A;  . PROSTATE BIOPSY  09/2015   Dr. Tresa [name]  . ROBOT ASSISTED LAPAROSCOPIC RADICAL PROSTATECTOMY N/A 11/24/2015   Procedure: XI ROBOTIC ASSISTED LAPAROSCOPIC RADICAL PROSTATECTOMY WITH INDOCYANINE GREEN DYE;  Surgeon: Alexis Frock, MD;  Location: [facility] ORS;  Service: Urology;  Laterality: N/A;  . VASECTOMY          Home Medications    Prior to Admission medications   Medication  Sig Start Date End Date Taking? Authorizing Provider  amLODipine (NORVASC) 5 MG tablet Take 1 tablet (5 mg total) by mouth daily. 08/14/18   Colon Branch, MD  aspirin 81 MG tablet Take 81 mg by mouth daily.    [provider]  atorvastatin (LIPITOR) 20 MG tablet TAKE 1 TABLET BY MOUTH  DAILY 08/21/18   Colon Branch, MD  carvedilol (COREG) 12.5 MG tablet Take 1 tablet (12.5 mg total) by mouth 2 (two) times daily with a meal. 08/14/18   Colon Branch, MD  ferrous sulfate 325 (65 FE) MG tablet Take  1 tablet (325 mg total) 2 (two) times daily with a meal by mouth. 08/11/17   Colon Branch, MD  hydrOXYzine (ATARAX/VISTARIL) 25 MG tablet Take 1 tablet (25 mg total) every 6 (six) hours by mouth. 08/17/17   Carlisle Cater, PA-C  Leuprolide Acetate (LUPRON DEPOT, 34-MONTH, IM) Inject 1 each into the muscle. Every 6 months per Urology    [provider]  levothyroxine (SYNTHROID, LEVOTHROID) 88 MCG tablet Take 1 tablet (88 mcg total) by mouth daily before breakfast. 08/25/18   Colon Branch, MD  lisinopril-hydrochlorothiazide (PRINZIDE,ZESTORETIC) 20-12.5 MG tablet Take 2 tablets by mouth daily. 08/11/18   Colon Branch, MD  meloxicam [facility]) 7.5 MG tablet take 1 tablet by mouth once daily Patient not taking: Reported on 08/19/2018 10/04/16   Saguier, Percell Miller, PA-C  nitroGLYCERIN (NITROSTAT) 0.4 MG SL tablet Place 1 tablet (0.4 mg total) under the tongue every 5 (five) minutes as needed for chest pain. 08/19/18   Colon Branch, MD  oxymetazoline (NASAL DECONGESTANT SPRAY) 0.05 % nasal spray As directed 08/01/17   [provider]  predniSONE (DELTASONE) 5 MG tablet Take 1 tablet (5 mg total) by mouth daily with breakfast. 12/04/17   Wyatt Portela, MD  sertraline (ZOLOFT) 50 MG tablet Take 1 tablet (50 mg total) by mouth daily. 08/19/18   Colon Branch, MD  sodium chloride (OCEAN) 0.65 % SOLN nasal spray Place 1 spray into both nostrils as needed for congestion.    [provider]  ZYTIGA 250 MG tablet TAKE 4 TABLETS BY MOUTH  ONCE DAILY ON AN EMPTY  STOMACH 1 HOUR BEFORE OR 2  HOURS AFTER A MEAL 09/07/18   Wyatt Portela, MD    Family History Family History  Problem Relation Age of Onset  . Dementia Mother   . Hypertension Mother   . Coronary artery disease Father   . Diabetes Mellitus II Father   . Lupus Sister   . Coronary artery disease Brother   . Diabetes Brother   . Hypertension Brother   . Prostate cancer Brother   . Thyroid cancer Sister   . Heart failure Sister   .  Colon cancer Neg Hx     Social History Social History   Tobacco Use  . Smoking status: Former Smoker    Last attempt to quit: 07/18/1990    Years since quitting: 28.1  . Smokeless tobacco: Former Systems developer  . Tobacco comment: 1991  Substance Use Topics  . Alcohol use: Yes    Comment: weekly  . Drug use: Yes    Types: Marijuana     Allergies   Patient has no known allergies.   Review of Systems Review of Systems  Unable to perform ROS: Mental status change     Physical Exam Updated Vital Signs BP (!) 177/84 (BP Location: Right Arm)   Pulse (!) 55  Temp 98.1 F (36.7 C) (Oral)   Resp 19   SpO2 100%   Physical Exam  Constitutional: He appears well-developed and well-nourished.  HENT:  Head: Normocephalic and atraumatic.  Right Ear: Tympanic membrane, external ear and ear canal normal.  Left Ear: Tympanic membrane, external ear and ear canal normal.  Nose: Nose normal.  Mouth/Throat: Uvula is midline, oropharynx is clear and moist and mucous membranes are normal.  Eyes: Pupils are equal, round, and reactive to light. Conjunctivae, EOM and lids are normal.  Neck: Normal range of motion. Neck supple.  Cardiovascular: Normal rate and regular rhythm.  Pulmonary/Chest: Effort normal and breath sounds normal.  Abdominal: Soft. There is no tenderness.  Musculoskeletal: Normal range of motion.       Cervical back: He exhibits normal range of motion, no tenderness and no bony tenderness.  Neurological: He is alert. He has normal strength and normal reflexes. No cranial nerve deficit or sensory deficit. He exhibits normal muscle tone. He displays a negative Romberg sign. Coordination and gait normal. GCS eye subscore is 4. GCS verbal subscore is 5. GCS motor subscore is 6.  Patient states that the year is in the 2000's.  He thinks that it is December.  He cannot tell me events of previous day.  He states that he is still employed.  Skin: Skin is warm and dry.  Psychiatric: He  has a normal mood and affect.  Nursing note and vitals reviewed.    ED Treatments / Results  Labs (all labs ordered are listed, but only abnormal results are displayed) Labs Reviewed  COMPREHENSIVE METABOLIC PANEL - Abnormal; Notable for the following components:      Result Value   Potassium 3.3 (*)    Glucose, Bld 117 (*)    Total Protein 8.5 (*)    Total Bilirubin 1.3 (*)    All other components within normal limits  URINALYSIS, ROUTINE W REFLEX MICROSCOPIC - Abnormal; Notable for the following components:   Color, Urine STRAW (*)    Hgb urine dipstick SMALL (*)    All other components within normal limits  ETHANOL  PROTIME-INR  APTT  CBC  DIFFERENTIAL  RAPID URINE DRUG SCREEN, HOSP PERFORMED  I-STAT TROPONIN, ED    EKG EKG Interpretation  Date/Time:  Tuesday September 15 2018 17:39:04 EST Ventricular Rate:  54 PR Interval:    QRS Duration: 108 QT Interval:  459 QTC Calculation: 435 R Axis:   7 Text Interpretation:  Sinus rhythm Incomplete left bundle branch block Confirmed by Gerlene Fee [phone]) on 09/15/2018 6:54:38 PM   Radiology Ct Head Wo Contrast  Result Date: 09/15/2018 CLINICAL DATA:  Memory loss.  History of prostate carcinoma EXAM: CT HEAD WITHOUT CONTRAST TECHNIQUE: Contiguous axial images were obtained from the base of the skull through the vertex without intravenous contrast. COMPARISON:  None. FINDINGS: Brain: The ventricles are normal in size and configuration. There is no evident intracranial mass, hemorrhage, extra-axial fluid collection, or midline shift. Brain parenchyma appears unremarkable. No acute infarct is demonstrable. Vascular: There is no appreciable hyperdense vessel. There is calcification in each carotid siphon region. Skull: There are scattered sclerotic lesions in the calvarium. No evident fracture. No lytic or destructive lesions evident. Sinuses/Orbits: There is mucosal thickening in several ethmoid air cells. Other paranasal  sinuses are clear. Orbits appear symmetric bilaterally. Other: Mastoid air cells are clear. IMPRESSION: 1. Brain parenchyma appears unremarkable. No acute infarct evident. No intracranial mass or hemorrhage. 2. Sclerotic foci within the bony  calvarium, concerning for prostate carcinoma metastases. 3.  Foci of arterial vascular calcification. 4.  Mucosal thickening in several ethmoid air cells noted. Electronically Signed   By: Lowella Grip III M.D.   On: 09/15/2018 18:30    Procedures Procedures (including critical care time)  Medications Ordered in ED Medications - No data to display   Initial Impression / Assessment and Plan / ED Course  I have reviewed the triage vital signs and the nursing notes.  Pertinent labs & imaging results that were available during my care of the patient were reviewed by me and considered in my medical decision making (see chart for details).    Patient seen and examined. Work-up initiated. BP improved but will monitor. Spoke with family.  This is acutely abnormal behavior for the patient.  EKG reviewed by myself.    Vital signs reviewed and are as follows: BP (!) 177/84 (BP Location: Right Arm)   Pulse (!) 55   Temp 98.1 F (36.7 C) (Oral)   Resp 19   SpO2 100%   7:53 PM Patient's exam is unchanged.  He does not remember talking to me earlier.  He continues to ask the same questions about his job.  Spoke with neurology, Dr. Leonel Ramsay. Concern for transient global amnesia. Reccs observation and admission for MRI.   I have not yet disclosed existence of new skull lesion to patient given his current altered mentation. I have sent a message though Epic to his oncologist requesting follow-up for this.   Spoke with Dr. Hal Hope who will see patient.   Final Clinical Impressions(s) / ED Diagnoses   Final diagnoses:  Transient global amnesia  Essential hypertension  Skull lesion  Prostate cancer ([place])   Admit.   ED Discharge Orders    None         Carlisle Cater, Hershal Coria 09/15/18 1956    Maudie Flakes, MD 09/16/18 [phone]

## 2018-09-15 NOTE — ED Triage Notes (Signed)
Pt from [facility] walk in area. EMS reports he was there to talk to someone because he recently lost his job. During his conversation with the counselor he had sudden onset memory loss and was asking repetitive questions. Pt has no other neuro deficits. BP initially 230/120 but is now 160/80 P60 CBG 113. Pt asking repetitive questions during triage and repetitively saying "did I pass out at work?". Pt alert and oriented to self only.

## 2018-09-16 ENCOUNTER — Observation Stay (HOSPITAL_COMMUNITY): Payer: BLUE CROSS/BLUE SHIELD

## 2018-09-16 DIAGNOSIS — I160 Hypertensive urgency: Secondary | ICD-10-CM | POA: Diagnosis not present

## 2018-09-16 DIAGNOSIS — R930 Abnormal findings on diagnostic imaging of skull and head, not elsewhere classified: Secondary | ICD-10-CM

## 2018-09-16 DIAGNOSIS — I10 Essential (primary) hypertension: Secondary | ICD-10-CM | POA: Diagnosis not present

## 2018-09-16 DIAGNOSIS — E876 Hypokalemia: Secondary | ICD-10-CM | POA: Diagnosis not present

## 2018-09-16 DIAGNOSIS — G454 Transient global amnesia: Secondary | ICD-10-CM | POA: Diagnosis not present

## 2018-09-16 DIAGNOSIS — C61 Malignant neoplasm of prostate: Secondary | ICD-10-CM

## 2018-09-16 DIAGNOSIS — E039 Hypothyroidism, unspecified: Secondary | ICD-10-CM

## 2018-09-16 LAB — BASIC METABOLIC PANEL
Anion gap: 12 (ref 5–15)
BUN: 8 mg/dL (ref 6–20)
CO2: 25 mmol/L (ref 22–32)
Calcium: 9.2 mg/dL (ref 8.9–10.3)
Chloride: 102 mmol/L (ref 98–111)
Creatinine, Ser: 0.78 mg/dL (ref 0.61–1.24)
GFR calc Af Amer: >60 mL/min (ref >60)
GFR calc non Af Amer: >60 mL/min (ref >60)
Glucose, Bld: 112 mg/dL — ABNORMAL HIGH (ref 70–99)
Potassium: 2.5 mmol/L — CL (ref 3.5–5.1)
Sodium: 139 mmol/L (ref 135–145)

## 2018-09-16 LAB — CBC
HCT: 40.1 % (ref 39.0–52.0)
Hemoglobin: 13.6 g/dL (ref 13.0–17.0)
MCH: 27.3 pg (ref 26.0–34.0)
MCHC: 33.9 g/dL (ref 30.0–36.0)
MCV: 80.5 fL (ref 80.0–100.0)
Platelets: 199 10*3/uL (ref 150–400)
RBC: 4.98 MIL/uL (ref 4.22–5.81)
RDW: 13.2 % (ref 11.5–15.5)
WBC: 6.3 10*3/uL (ref 4.0–10.5)
nRBC: 0 % (ref 0.0–0.2)

## 2018-09-16 LAB — HIV ANTIBODY (ROUTINE TESTING W REFLEX): HIV Screen 4th Generation wRfx: NONREACTIVE

## 2018-09-16 MED ORDER — POTASSIUM CHLORIDE CRYS ER 20 MEQ PO TBCR
40.0000 meq | EXTENDED_RELEASE_TABLET | Freq: Once | ORAL | Status: AC
  Administered 2018-09-16: 40 meq via ORAL
  Filled 2018-09-16: qty 2

## 2018-09-16 MED ORDER — CARVEDILOL 12.5 MG PO TABS
12.5000 mg | ORAL_TABLET | Freq: Two times a day (BID) | ORAL | Status: DC
  Administered 2018-09-16: 12.5 mg via ORAL
  Filled 2018-09-16 (×2): qty 1

## 2018-09-16 MED ORDER — POTASSIUM CHLORIDE CRYS ER 20 MEQ PO TBCR: 20.0000 meq | EXTENDED_RELEASE_TABLET | Freq: Once | ORAL | Status: DC

## 2018-09-16 MED ORDER — LISINOPRIL 40 MG PO TABS: 40.0000 mg | ORAL_TABLET | Freq: Every day | ORAL | 0 refills | Status: DC

## 2018-09-16 MED ORDER — POTASSIUM CHLORIDE CRYS ER 20 MEQ PO TBCR
40.0000 meq | EXTENDED_RELEASE_TABLET | Freq: Two times a day (BID) | ORAL | Status: DC
  Administered 2018-09-16: 40 meq via ORAL
  Filled 2018-09-16: qty 2

## 2018-09-16 NOTE — Progress Notes (Signed)
CRITICAL VALUE ALERT  Critical Value:  2.5  Date & Time Notied:  09/16/18 @ 0734  Provider Notified: Dr. Louanne Belton notified @ [phone]  Orders Received/Actions taken: [place] ordered just prior to lab resulting.

## 2018-09-16 NOTE — Progress Notes (Signed)
EEG Completed; Results Pending  

## 2018-09-16 NOTE — Procedures (Signed)
ELECTROENCEPHALOGRAM REPORT   Patient: Gary [name]       Room #: 4F96L EEG No. ID: [id number] Age: 62 y.o.        Sex: male Referring Physician: Pokhrel Report Date:  09/16/2018        Interpreting Physician: Alexis Goodell  History: RAYBON CONARD is an 62 y.o. male with altered mental status  Medications:  Zytiga, Norvasc, ASA, Lipitor, Coreg, HCTZ, Synthroid, Prinivil, K-dur, Deltasone, Zoloft  Conditions of Recording:  This is a 21 channel routine scalp EEG performed with bipolar and monopolar montages arranged in accordance to the international 10/20 system of electrode placement. One channel was dedicated to EKG recording.  The patient is in the awake state.  Description:  The waking background activity consists of a low voltage, symmetrical, fairly well organized, 10 Hz alpha activity, seen from the parieto-occipital and posterior temporal regions.  Low voltage fast activity, poorly organized, is seen anteriorly and is at times superimposed on more posterior regions.  A mixture of theta and alpha rhythms are seen from the central and temporal regions. The patient does not drowse or sleep. No epileptiform activity is noted.   Hyperventilation was performed and produced a mild to moderate buildup but failed to elicit any abnormalities.  Intermittent photic stimulation was performed but failed to illicit any change in the tracing.    IMPRESSION: This is a normal awake electroencephalogram. There are no focal lateralizing or epileptiform features.   Alexis Goodell, MD Neurology [phone] 09/16/2018, 3:02 PM

## 2018-09-16 NOTE — Progress Notes (Addendum)
NEUROLOGY PROGRESS NOTE  Subjective: Patient is alert and oriented today.  He does recall that he went to a coping meeting yesterday due to him losing his job.  He is getting bits and pieces of his memory back.  He does recall that he was up in his bed on the floor and saw his wife rolled by in the wheelchair.  States he is a slight headache in the back of his head.  He also states that he is in a "funk".  He has not had any more episodes of forgetfulness.  Exam: Vitals:   09/16/18 0302 09/16/18 0502  BP: 124/73 122/69  Pulse:    Resp:    Temp:    SpO2: 91% 93%    Physical Exam   HEENT-  Normocephalic, no lesions, without obvious abnormality.  Normal external eye and conjunctiva.   Extremities- Warm, dry and intact Musculoskeletal-no joint tenderness, deformity or swelling Skin-warm and dry, no hyperpigmentation, vitiligo, or suspicious lesions    Neuro:  Mental Status: Alert, oriented, thought content appropriate.  Speech fluent without evidence of aphasia.  Able to follow 3 step commands without difficulty.  Appears depressed. Cranial Nerves: II:  Visual fields grossly normal,  III,IV, VI: ptosis not present, extra-ocular motions intact bilaterally pupils equal, round, reactive to light and accommodation V,VII: smile symmetric, facial light touch sensation normal bilaterally VIII: hearing normal bilaterally IX,X: uvula rises midline XI: bilateral shoulder shrug XII: midline tongue extension Motor: Right : Upper extremity   5/5    Left:     Upper extremity   5/5  Lower extremity   5/5     Lower extremity   5/5 Tone and bulk:normal tone throughout; no atrophy noted Sensory: Pinprick and light touch intact throughout, bilaterally Deep Tendon Reflexes: 2+ and symmetric throughout Plantars: Right: downgoing   Left: downgoing Cerebellar: normal finger-to-nose, normal rapid alternating movements and normal heel-to-shin test     Medications:  Prior to Admission:   Medications Prior to Admission  Medication Sig Dispense Refill Last Dose  . amLODipine (NORVASC) 5 MG tablet Take 1 tablet (5 mg total) by mouth daily. 90 tablet 1 09/15/2018 at Unknown time  . aspirin 81 MG tablet Take 81 mg by mouth daily.   09/15/2018 at 0700  . atorvastatin (LIPITOR) 20 MG tablet TAKE 1 TABLET BY MOUTH  DAILY (Patient taking differently: Take 20 mg by mouth daily. ) 90 tablet 1 09/15/2018 at Unknown time  . carvedilol (COREG) 12.5 MG tablet Take 1 tablet (12.5 mg total) by mouth 2 (two) times daily with a meal. 180 tablet 1 09/15/2018 at 0700  . levothyroxine (SYNTHROID, LEVOTHROID) 88 MCG tablet Take 1 tablet (88 mcg total) by mouth daily before breakfast. 90 tablet 1 09/15/2018 at Unknown time  . lisinopril-hydrochlorothiazide (PRINZIDE,ZESTORETIC) 20-12.5 MG tablet Take 2 tablets by mouth daily. 180 tablet 1 09/15/2018 at Unknown time  . nitroGLYCERIN (NITROSTAT) 0.4 MG SL tablet Place 1 tablet (0.4 mg total) under the tongue every 5 (five) minutes as needed for chest pain. 25 tablet 3 unk  . oxymetazoline (NASAL DECONGESTANT SPRAY) 0.05 % nasal spray Place 2 sprays into both nostrils 2 (two) times daily as needed for congestion. As directed   unk  . sertraline (ZOLOFT) 50 MG tablet Take 1 tablet (50 mg total) by mouth daily. 90 tablet 1 09/15/2018 at Unknown time  . sodium chloride (OCEAN) 0.65 % SOLN nasal spray Place 1 spray into both nostrils as needed for congestion.   unk  .  ZYTIGA 250 MG tablet TAKE 4 TABLETS BY MOUTH  ONCE DAILY ON AN EMPTY  STOMACH 1 HOUR BEFORE OR 2  HOURS AFTER A MEAL (Patient taking differently: Take 1,000 mg by mouth daily. ) 120 tablet 0 09/15/2018 at Unknown time  . ferrous sulfate 325 (65 FE) MG tablet Take 1 tablet (325 mg total) 2 (two) times daily with a meal by mouth. (Patient not taking: Reported on 09/15/2018) 60 tablet 3 Not Taking at Unknown time  . hydrOXYzine (ATARAX/VISTARIL) 25 MG tablet Take 1 tablet (25 mg total) every 6 (six)  hours by mouth. (Patient not taking: Reported on 09/15/2018) 12 tablet 0 Not Taking at Unknown time  . meloxicam (MOBIC) 7.5 MG tablet take 1 tablet by mouth once daily (Patient not taking: Reported on 08/19/2018) 30 tablet 0 Not Taking at Unknown time  . predniSONE (DELTASONE) 5 MG tablet Take 1 tablet (5 mg total) by mouth daily with breakfast. (Patient not taking: Reported on 09/15/2018) 90 tablet 3 Not Taking at Unknown time   Scheduled: . abiraterone acetate  1,000 mg Oral Daily  . amLODipine  5 mg Oral Daily  . aspirin  81 mg Oral Daily  . atorvastatin  20 mg Oral q1800  . carvedilol  12.5 mg Oral BID WC  . enoxaparin (LOVENOX) injection  40 mg Subcutaneous Q24H  . lisinopril  40 mg Oral Daily   And  . hydrochlorothiazide  25 mg Oral Daily  . levothyroxine  88 mcg Oral Q24H  . potassium chloride  40 mEq Oral BID  . predniSONE  5 mg Oral Q breakfast  . sertraline  50 mg Oral Daily   QQV:ZDGLOVFIEPPIR **OR** acetaminophen, hydrALAZINE, nitroGLYCERIN, ondansetron **OR** ondansetron (ZOFRAN) IV  Pertinent Labs/Diagnostics: -Potassium 2.5  Ct Head Wo Contrast  Result Date: 09/15/2018 CLINICAL DATA:  IMPRESSION: 1. Brain parenchyma appears unremarkable. No acute infarct evident. No intracranial mass or hemorrhage. 2. Sclerotic foci within the bony calvarium, concerning for prostate carcinoma metastases. 3.  Foci of arterial vascular calcification. 4.  Mucosal thickening in several ethmoid air cells noted. Electronically Signed   By: Lowella Grip III M.D.   On: 09/15/2018 18:30   Mr Brain Wo Contrast Result Date: 09/15/2018 CLINICAL DATA: IMPRESSION: Normal MRI head without contrast for age. Electronically Signed   By: Elon Alas M.D.   On: 09/15/2018 21:44       Etta Quill PA-C [facility] [phone]  EEG: This is a normal awake electroencephalogram. There are no focal lateralizing or epileptiform features.  Assessment: 62 year old male with  antegrade greater than retrograde amnesia.  On initial consultation this is partially consistent with a transient global amnesia. However, the lack of memory of certain events over the past few years does raise possibility for psychogenic fugue.  Patient recently was let go from his job and has multiple stressors.  MRI brain and EEG are negative.    Recommendations: - Replete potassium - Psychology consult to evaluate for depression, psychogenic fugue, and life stressors - As EEG and MRI are negative and patient is back to baseline, Neurology will sign off.  Please call us with any further questions   Electronically signed: Dr. Kerney Elbe 09/16/2018, 9:10 AM

## 2018-09-16 NOTE — Progress Notes (Signed)
Patient discharged home with spouse. Discharge information given. Prescription available at pharmacy. IV and telemetry discontinued. Patient questions asked and answered. Patient strongly advised to see primary care physician as soon as possible as it may be difficult to establish psychiatric care quickly. Patient transported from unit via wheelchair with staff. Wendee Copp

## 2018-09-16 NOTE — Discharge Summary (Addendum)
Physician Discharge Summary  Gary [name] HDQ:[id number] DOB: 09-22-1956 DOA: 09/15/2018  PCP: Colon Branch, MD  Admit date: 09/15/2018 Discharge date: 09/16/2018  Time spent: 45 minutes  Recommendations for Outpatient Follow-up:  1. Follow up with [facility] as schedule 2. Follow up with PCP 3-4 days. Recommend cmet for evaluation of total bili and potassium level, evaluation of BP control as medications adjusted and evaluation of coping strategies for episodic anxiety 3. Follow up with urology for evaluation of CT results   Discharge Diagnoses:  Principal Problem:   Transient global amnesia Active Problems:   Hypertensive urgency   Abnormal CT of the head   Hypertension, essential   Hypothyroidism   Hypokalemia   Prostate cancer ([place])   Total bilirubin, elevated   Discharge Condition: stable  Diet recommendation: heart healthy  Filed Weights   09/15/18 2207  Weight: 111.7 kg    History of present illness:  Gary [name] is a 62 y.o. male with history of prostate cancer, hypothyroidism, hypertension was noticed to have loss of memory on 09/15/18 and came to emergency department. On the day prior patient was informed that his job is being eliminated.  Patient was not able to think coherently and forgetting things and was brought to the ER.  Did not lose function of his extremities or have any visual symptoms or difficulty speaking or swallowing.  Hospital Course:   1. Transient global amnesia versus psychogenic fugue - CT head followed by MRI head did not show anything acute.  Dr. Leonel Ramsay on-call neurologist who opined transient global amnesia with psychogenic fugue. Recommended obs admit and eeg.  at time of discharge patient back at baseline. Neuro exam benign 2. Hypertensive urgency resolved at discharge. HCTZ discontinued for now due to hypokalemia.  Coreg and amlodipine and lisinopril continues. Recommend close OP follow up for BP control and monitoring of potassium  level. 3. Mild hypokalemia. See above 4. Hypothyroidism on Synthroid.  TSH 1.5 5. History of prostate cancer on Zytiga and prednisone. 6. Hyperlipidemia on statins. 7. History of depression on Zoloft. Placed ambulatory referral in epic. 8. Abnormal Head CT. Sclerotic foci within the bony calvarium, concerning for prostate carcinoma metastases. Will need OP follow up with PCP and/or urology/heme/onc  Procedures:  eeg  Consultations:  neurology  Discharge Exam: Vitals:   09/16/18 0302 09/16/18 0502  BP: 124/73 122/69  Pulse:    Resp:    Temp:    SpO2: 91% 93%    General: well nourished lying in bed watching tv. No acute distress Cardiovascular: rrr no mgr no LE edema Respiratory: normal effort BS clear bilaterally no wheeze Neuro: alert oriented. Speech clear facial symmetry.   Discharge Instructions   Discharge Instructions    Ambulatory referral to Behavioral Health   Complete by:  As directed    Presented with amnesia and seen by neuro and tia work up. Neuro opine neg mri and may benefit OP BH follow up for depression, psychogenic fugue.     Allergies as of 09/16/2018   No Known Allergies     Medication List    STOP taking these medications   hydrOXYzine 25 MG tablet Commonly known as:  ATARAX/VISTARIL   lisinopril-hydrochlorothiazide 20-12.5 MG tablet Commonly known as:  PRINZIDE,ZESTORETIC   meloxicam 7.5 MG tablet Commonly known as:  MOBIC   predniSONE 5 MG tablet Commonly known as:  DELTASONE     TAKE these medications   amLODipine 5 MG tablet Commonly known as:  NORVASC Take 1  tablet (5 mg total) by mouth daily.   aspirin 81 MG tablet Take 81 mg by mouth daily.   atorvastatin 20 MG tablet Commonly known as:  LIPITOR TAKE 1 TABLET BY MOUTH  DAILY   carvedilol 12.5 MG tablet Commonly known as:  COREG Take 1 tablet (12.5 mg total) by mouth 2 (two) times daily with a meal.   ferrous sulfate 325 (65 FE) MG tablet Take 1 tablet (325 mg  total) 2 (two) times daily with a meal by mouth.   levothyroxine 88 MCG tablet Commonly known as:  SYNTHROID, LEVOTHROID Take 1 tablet (88 mcg total) by mouth daily before breakfast.   lisinopril 40 MG tablet Commonly known as:  PRINIVIL,ZESTRIL Take 1 tablet (40 mg total) by mouth daily. Start taking on:  09/17/2018   NASAL DECONGESTANT SPRAY 0.05 % nasal spray Generic drug:  oxymetazoline Place 2 sprays into both nostrils 2 (two) times daily as needed for congestion. As directed   nitroGLYCERIN 0.4 MG SL tablet Commonly known as:  NITROSTAT Place 1 tablet (0.4 mg total) under the tongue every 5 (five) minutes as needed for chest pain.   sertraline 50 MG tablet Commonly known as:  ZOLOFT Take 1 tablet (50 mg total) by mouth daily.   sodium chloride 0.65 % Soln nasal spray Commonly known as:  OCEAN Place 1 spray into both nostrils as needed for congestion.   ZYTIGA 250 MG tablet Generic drug:  abiraterone acetate TAKE 4 TABLETS BY MOUTH  ONCE DAILY ON AN EMPTY  STOMACH 1 HOUR BEFORE OR 2  HOURS AFTER A MEAL What changed:  See the new instructions.      No Known Allergies    The results of significant diagnostics from this hospitalization (including imaging, microbiology, ancillary and laboratory) are listed below for reference.    Significant Diagnostic Studies: Ct Head Wo Contrast  Result Date: 09/15/2018 CLINICAL DATA:  Memory loss.  History of prostate carcinoma EXAM: CT HEAD WITHOUT CONTRAST TECHNIQUE: Contiguous axial images were obtained from the base of the skull through the vertex without intravenous contrast. COMPARISON:  None. FINDINGS: Brain: The ventricles are normal in size and configuration. There is no evident intracranial mass, hemorrhage, extra-axial fluid collection, or midline shift. Brain parenchyma appears unremarkable. No acute infarct is demonstrable. Vascular: There is no appreciable hyperdense vessel. There is calcification in each carotid siphon  region. Skull: There are scattered sclerotic lesions in the calvarium. No evident fracture. No lytic or destructive lesions evident. Sinuses/Orbits: There is mucosal thickening in several ethmoid air cells. Other paranasal sinuses are clear. Orbits appear symmetric bilaterally. Other: Mastoid air cells are clear. IMPRESSION: 1. Brain parenchyma appears unremarkable. No acute infarct evident. No intracranial mass or hemorrhage. 2. Sclerotic foci within the bony calvarium, concerning for prostate carcinoma metastases. 3.  Foci of arterial vascular calcification. 4.  Mucosal thickening in several ethmoid air cells noted. Electronically Signed   By: Lowella Grip III M.D.   On: 09/15/2018 18:30   Mr Brain Wo Contrast  Result Date: 09/15/2018 CLINICAL DATA:  Altered mental status. Hypertensive. Acute onset memory loss and confusion. History of metastatic prostate cancer, alcohol dependence, hypertension, hyperlipidemia. EXAM: MRI HEAD WITHOUT CONTRAST TECHNIQUE: Multiplanar, multiecho pulse sequences of the brain and surrounding structures were obtained without intravenous contrast. COMPARISON:  CT HEAD September 15, 2018 FINDINGS: INTRACRANIAL CONTENTS: No reduced diffusion to suggest acute ischemia. No susceptibility artifact to suggest hemorrhage. The ventricles and sulci are normal for patient's age. A few nonspecific punctate supratentorial  white matter FLAIR T2 hyperintensities are normal for age. No suspicious parenchymal signal, masses, mass effect. No abnormal extra-axial fluid collections. No extra-axial masses. VASCULAR: Normal major intracranial vascular flow voids present at skull base. SKULL AND UPPER CERVICAL SPINE: No abnormal sellar expansion. No suspicious calvarial bone marrow signal. Craniocervical junction maintained. SINUSES/ORBITS: The mastoid air-cells and included paranasal sinuses are well-aerated.The included ocular globes and orbital contents are non-suspicious. OTHER: None.  IMPRESSION: Normal MRI head without contrast for age. Electronically Signed   By: Elon Alas M.D.   On: 09/15/2018 21:44   Dg Chest Portable 1 View  Result Date: 09/15/2018 CLINICAL DATA:  Prostate carcinoma.  Pre MRI EXAM: PORTABLE CHEST 1 VIEW COMPARISON:  October 22, 2012 FINDINGS: Lungs are clear. Heart is upper normal in size with pulmonary vascularity normal. No adenopathy. No pacemaker evident. There is a coronary stent on the left. There is aortic atherosclerosis. No blastic or lytic bone lesions. No metallic foreign body evident. IMPRESSION: Lungs clear. Cardiac silhouette within normal limits. There is aortic atherosclerosis. No pacemaker or metallic foreign body appreciable on this study. Aortic Atherosclerosis (ICD10-I70.0). Electronically Signed   By: Lowella Grip III M.D.   On: 09/15/2018 21:09    Microbiology: No results found for this or any previous visit (from the past 240 hour(s)).   Labs: Basic Metabolic Panel: Recent Labs  Lab 09/15/18 1756 09/16/18 0552  NA 137 139  K 3.3* 2.5*  CL 98 102  CO2 26 25  GLUCOSE 117* 112*  BUN 8 8  CREATININE 0.85 0.78  CALCIUM 9.3 9.2   Liver Function Tests: Recent Labs  Lab 09/15/18 1756  AST 31  ALT 39  ALKPHOS 99  BILITOT 1.3*  PROT 8.5*  ALBUMIN 4.4   No results for input(s): LIPASE, AMYLASE in the last 168 hours. No results for input(s): AMMONIA in the last 168 hours. CBC: Recent Labs  Lab 09/15/18 1756 09/16/18 0552  WBC 6.7 6.3  NEUTROABS 3.8  --   HGB 14.8 13.6  HCT 43.9 40.1  MCV 80.7 80.5  PLT 204 199   Cardiac Enzymes: No results for input(s): CKTOTAL, CKMB, CKMBINDEX, TROPONINI in the last 168 hours. BNP: BNP (last 3 results) No results for input(s): BNP in the last 8760 hours.  ProBNP (last 3 results) No results for input(s): PROBNP in the last 8760 hours.  CBG: No results for input(s): GLUCAP in the last 168 hours.   Signed:  Radene Gunning NP [facility] 09/16/2018, 3:18 PM  Addendum  Patient was seen and examined by me at bedside.  I agree with the discharge instructions made by Santiago Glad NP.    A 62 years old male with history of prostate cancer, hypothyroidism presented with acute loss of memory.  A CT scan of the head was performed which was nonacute.  He did not have focal neurological deficits. Neurology was consulted and there was concern for global amnesia with psychogenic fugue.  EEG was normal.  He was advised to follow-up with psychiatry as outpatient. Patient also had elevated blood pressure on presentation which improved during isolation.  He was also given potassium supplements for significant hypokalemia.  At this time, patient will be continued on medication for his depression.  He will follow-up with psychiatry as outpatient after discharge.

## 2018-09-18 ENCOUNTER — Ambulatory Visit (HOSPITAL_COMMUNITY)
Admission: RE | Admit: 2018-09-18 | Discharge: 2018-09-18 | Disposition: A | Discharge status: Home / Self Care | Payer: BLUE CROSS/BLUE SHIELD | Attending: Psychiatry | Admitting: Psychiatry

## 2018-09-18 ENCOUNTER — Encounter: Payer: Self-pay | Admitting: Internal Medicine

## 2018-09-18 ENCOUNTER — Telehealth: Payer: Self-pay

## 2018-09-18 ENCOUNTER — Ambulatory Visit (INDEPENDENT_AMBULATORY_CARE_PROVIDER_SITE_OTHER): Payer: BLUE CROSS/BLUE SHIELD | Admitting: Internal Medicine

## 2018-09-18 VITALS — BP 128/78 | HR 71 | Temp 97.7°F | Resp 16 | Ht 72.0 in | Wt 242.1 lb

## 2018-09-18 DIAGNOSIS — I10 Essential (primary) hypertension: Secondary | ICD-10-CM | POA: Insufficient documentation

## 2018-09-18 DIAGNOSIS — I2510 Atherosclerotic heart disease of native coronary artery without angina pectoris: Secondary | ICD-10-CM | POA: Insufficient documentation

## 2018-09-18 DIAGNOSIS — F32A Depression, unspecified: Secondary | ICD-10-CM

## 2018-09-18 DIAGNOSIS — E785 Hyperlipidemia, unspecified: Secondary | ICD-10-CM | POA: Diagnosis not present

## 2018-09-18 DIAGNOSIS — F419 Anxiety disorder, unspecified: Secondary | ICD-10-CM | POA: Diagnosis not present

## 2018-09-18 DIAGNOSIS — F329 Major depressive disorder, single episode, unspecified: Secondary | ICD-10-CM | POA: Diagnosis not present

## 2018-09-18 DIAGNOSIS — G35 Multiple sclerosis: Secondary | ICD-10-CM | POA: Insufficient documentation

## 2018-09-18 DIAGNOSIS — Z8546 Personal history of malignant neoplasm of prostate: Secondary | ICD-10-CM | POA: Diagnosis not present

## 2018-09-18 DIAGNOSIS — F4322 Adjustment disorder with anxiety: Secondary | ICD-10-CM | POA: Insufficient documentation

## 2018-09-18 DIAGNOSIS — G454 Transient global amnesia: Secondary | ICD-10-CM | POA: Diagnosis not present

## 2018-09-18 DIAGNOSIS — Z9889 Other specified postprocedural states: Secondary | ICD-10-CM | POA: Insufficient documentation

## 2018-09-18 LAB — COMPREHENSIVE METABOLIC PANEL
ALT: 37 U/L (ref 0–53)
AST: 22 U/L (ref 0–37)
Albumin: 4.6 g/dL (ref 3.5–5.2)
Alkaline Phosphatase: 101 U/L (ref 39–117)
BUN: 9 mg/dL (ref 6–23)
CALCIUM: 9.9 mg/dL (ref 8.4–10.5)
CHLORIDE: 103 meq/L (ref 96–112)
CO2: 28 mEq/L (ref 19–32)
Creatinine, Ser: 0.71 mg/dL (ref 0.40–1.50)
GFR: 145.55 mL/min (ref >60.00)
Glucose, Bld: 111 mg/dL — ABNORMAL HIGH (ref 70–99)
Potassium: 3.9 mEq/L (ref 3.5–5.1)
Sodium: 142 mEq/L (ref 135–145)
Total Bilirubin: 0.8 mg/dL (ref 0.2–1.2)
Total Protein: 7.8 g/dL (ref 6.0–8.3)

## 2018-09-18 LAB — CBC WITH DIFFERENTIAL/PLATELET
Basophils Absolute: 0.1 10*3/uL (ref 0.0–0.1)
Basophils Relative: 1.9 % (ref 0.0–3.0)
EOS PCT: 2.7 % (ref 0.0–5.0)
Eosinophils Absolute: 0.2 10*3/uL (ref 0.0–0.7)
HCT: 45.4 % (ref 39.0–52.0)
Hemoglobin: 15.2 g/dL (ref 13.0–17.0)
Lymphocytes Relative: 34.6 % (ref 12.0–46.0)
Lymphs Abs: 1.9 10*3/uL (ref 0.7–4.0)
MCHC: 33.4 g/dL (ref 30.0–36.0)
MCV: 83.3 fl (ref 78.0–100.0)
Monocytes Absolute: 0.4 10*3/uL (ref 0.1–1.0)
Monocytes Relative: 6.8 % (ref 3.0–12.0)
Neutro Abs: 3 10*3/uL (ref 1.4–7.7)
Neutrophils Relative %: 54 % (ref 43.0–77.0)
Platelets: 216 10*3/uL (ref 150.0–400.0)
RBC: 5.45 Mil/uL (ref 4.22–5.81)
RDW: 14.5 % (ref 11.5–15.5)
WBC: 5.6 10*3/uL (ref 4.0–10.5)

## 2018-09-18 MED ORDER — SERTRALINE HCL 100 MG PO TABS: 100.0000 mg | ORAL_TABLET | Freq: Every day | ORAL | 0 refills | Status: DC

## 2018-09-18 MED ORDER — LISINOPRIL 40 MG PO TABS: 40.0000 mg | ORAL_TABLET | Freq: Every day | ORAL | 0 refills | Status: DC

## 2018-09-18 NOTE — Patient Instructions (Signed)
GO TO THE LAB : Get the blood work     GO TO THE FRONT DESK Schedule your next appointment for a checkup in 1 month  Increase sertraline to 100 mg.  We will send a prescription  In addition to see a counselor, I recommended to see a psychiatrist.  See list.   Check the  blood pressure  daily Be sure your blood pressure is between 110/65 and  135/85. If it is consistently higher or lower, let me know   SUICIDE PREVENTION:  Find Help 24/7, [place] Call our 24-hour HelpLine at 205-764-2597 or [place]: 1-800-SUICIDE   The National Suicide Prevention Lifeline: [employer]

## 2018-09-18 NOTE — Assessment & Plan Note (Signed)
TGA: improving. @ the hospital,  MRI brain normal,   EEG was negative.  Interestingly, the wife reports "this is not the first time he acts like that". Plan: neuro referral  Anxiety: Worse, due to recent news regards losing his employment. We will increase sertraline from 50 to 100 mg. Recommend to see a psychiatrist. I attempt to facilitate an appointment with the behavioral facility, after I talked to 5 people over the phone,  I was unable to get help.  Advised patient to call or go in person and keep working on an appointment;   hotline numbers provided, see AVS, ER if symptoms severe.  Currently he denies any suicidal ideas. Extensive paperwork filled; he needs a 6-week short-term disability. HTN: BP was elevated hospital however he had hypokalemia, BP normalized, HCTZ discontinue.  He is currently on carvedilol, amlodipine, lisinopril.  BP today is very good.  No change but monitor BPs.  Check a CMP, CBC. F/u 1 month.

## 2018-09-18 NOTE — Progress Notes (Signed)
Subjective:    Patient ID: Gary [name], male    DOB: [birth year], 62 y.o.   MRN: [id number]  DOS:  09/18/2018 Type of visit - description : hospital f/u, here w/ wife Was admitted to the hospital and discharged 09/16/2018.  First was seen by behavioral, sent to the ER and eventually admitted   He presented with acute loss of memory 1 day PTA, happening in the context of the patient losing his job. He was not able to think coherently and he forgot most things. At the hospital was evaluated by a CT head and brain MRI.  CT head shows some sclerotic changes at the bony calvarium concerning for prostate cancer  metastases ; MRI was nonacute. Neurology was  Consulted: likely Transient global amnesia with psychogenic fugue. He was recommended to have a EEG. He had hypokalemia, HCTZ discontinue  Review of Systems Since he left the hospital, he feels he is "on a fog". He has been sleeping a lot. His wife is with him, I asked about suicidal ideas there has been none. Physically he feels okay, denies chest pain, difficulty breathing.  No headache nausea or vomiting.   Past Medical History:  Diagnosis Date  . Anxiety   . CAD (coronary artery disease) 10-2012   h/o stents   . Elevated PSA    Prostate Bx w/ Dr. Tresa [name]  . EtOH dependence ([place])   . Family history of adverse reaction to anesthesia    made father disoriented  . Gross hematuria   . Hepatic steatosis   . Hyperlipidemia LDL goal < 70   . Hypertension   . Metastatic adenocarcinoma to prostate ([place]) 2017   Gleason score 9, metastasis to R external Iliac node and large extraprostatic extension at prostatectomy  . Multiple sclerosis ([place]) 1991   Bilateral optic neuritis, treated and resolved but felt to be a manifestation of MS.  . Renal cyst, left    Bosniak 2K (indeterminant) hyperdense cyst CT 2014, Stable CT in 2016  . Ulcerative colitis [facility])     Past Surgical History:  Procedure Laterality Date  . FOOT SURGERY     R  toe Fx (screws),  L dorsal FX (screws)  . LEFT HEART CATHETERIZATION WITH CORONARY ANGIOGRAM N/A 10/22/2012   Procedure: LEFT HEART CATHETERIZATION WITH CORONARY ANGIOGRAM;  Surgeon: Sinclair Grooms, MD;  Location[facility] C[facility] LAB;  Service: Cardiovascular;  Laterality: N/A;  . LYMPHADENECTOMY Bilateral 11/24/2015   Procedure: BILATERAL PELVIC LYMPHADENECTOMY;  Surgeon: Alexis Frock, MD;  Location: [facility] ORS;  Service: Urology;  Laterality: Bilateral;  . PERCUTANEOUS CORONARY STENT INTERVENTION (PCI-S) N/A 10/23/2012   Procedure: PERCUTANEOUS CORONARY STENT INTERVENTION (PCI-S);  Surgeon: Sinclair Grooms, MD;  Location: [facility] CATH LAB;  Service: Cardiovascular;  Laterality: N/A;  . PROSTATE BIOPSY  09/2015   Dr. Tresa [name]  . ROBOT ASSISTED LAPAROSCOPIC RADICAL PROSTATECTOMY N/A 11/24/2015   Procedure: XI ROBOTIC ASSISTED LAPAROSCOPIC RADICAL PROSTATECTOMY WITH INDOCYANINE GREEN DYE;  Surgeon: Alexis Frock, MD;  Location: [facility] ORS;  Service: Urology;  Laterality: N/A;  . VASECTOMY      Social History   Socioeconomic History  . Marital status: Married    Spouse name: Not on file  . Number of children: 1  . Years of education: Not on file  . Highest education level: Not on file  Occupational History  . Occupation: Actuary: [employer]  Social Needs  . Financial resource strain: Not on file  . Food  insecurity:    Worry: Not on file    Inability: Not on file  . Transportation needs:    Medical: Not on file    Non-medical: Not on file  Tobacco Use  . Smoking status: Former Smoker    Last attempt to quit: 07/18/1990    Years since quitting: 28.1  . Smokeless tobacco: Former Systems developer  . Tobacco comment: 1991  Substance and Sexual Activity  . Alcohol use: Yes    Comment: weekly  . Drug use: Yes    Types: Marijuana  . Sexual activity: Not on file  Lifestyle  . Physical activity:    Days per week: Not on file    Minutes per session: Not on file  . Stress: Not on file  Relationships    . Social connections:    Talks on phone: Not on file    Gets together: Not on file    Attends religious service: Not on file    Active member of club or organization: Not on file    Attends meetings of clubs or organizations: Not on file    Relationship status: Not on file  . Intimate partner violence:    Fear of current or ex partner: Not on file    Emotionally abused: Not on file    Physically abused: Not on file    Forced sexual activity: Not on file  Other Topics Concern  . Not on file  Social History Narrative   Household-- pt and wife   Has an adult son      Allergies as of 09/18/2018   No Known Allergies     Medication List       Accurate as of September 18, 2018  5:24 PM. Always use your most recent med list.        amLODipine 5 MG tablet Commonly known as:  NORVASC Take 1 tablet (5 mg total) by mouth daily.   aspirin 81 MG tablet Take 81 mg by mouth daily.   atorvastatin 20 MG tablet Commonly known as:  LIPITOR TAKE 1 TABLET BY MOUTH  DAILY   carvedilol 12.5 MG tablet Commonly known as:  COREG Take 1 tablet (12.5 mg total) by mouth 2 (two) times daily with a meal.   ferrous sulfate 325 (65 FE) MG tablet Take 1 tablet (325 mg total) 2 (two) times daily with a meal by mouth.   levothyroxine 88 MCG tablet Commonly known as:  SYNTHROID, LEVOTHROID Take 1 tablet (88 mcg total) by mouth daily before breakfast.   lisinopril 40 MG tablet Commonly known as:  PRINIVIL,ZESTRIL Take 1 tablet (40 mg total) by mouth daily.   NASAL DECONGESTANT SPRAY 0.05 % nasal spray Generic drug:  oxymetazoline Place 2 sprays into both nostrils 2 (two) times daily as needed for congestion. As directed   nitroGLYCERIN 0.4 MG SL tablet Commonly known as:  NITROSTAT Place 1 tablet (0.4 mg total) under the tongue every 5 (five) minutes as needed for chest pain.   sertraline 100 MG tablet Commonly known as:  ZOLOFT Take 1 tablet (100 mg total) by mouth daily.   sodium  chloride 0.65 % Soln nasal spray Commonly known as:  OCEAN Place 1 spray into both nostrils as needed for congestion.   ZYTIGA 250 MG tablet Generic drug:  abiraterone acetate TAKE 4 TABLETS BY MOUTH  ONCE DAILY ON AN EMPTY  STOMACH 1 HOUR BEFORE OR 2  HOURS AFTER A MEAL  Objective:   Physical Exam BP 128/78 (BP Location: Right Arm, Patient Position: Sitting, Cuff Size: Normal)   Pulse 71   Temp 97.7 F (36.5 C) (Oral)   Resp 16   Ht 6' (1.829 m)   Wt 242 lb 2 oz (109.8 kg)   SpO2 98%   BMI 32.84 kg/m  General:   Well developed, NAD, BMI noted. HEENT:  Normocephalic . Face symmetric, atraumatic Lungs:  CTA B Normal respiratory effort, no intercostal retractions, no accessory muscle use. Heart: RRR,  no murmur.  No pretibial edema bilaterally  Skin: Not pale. Not jaundice Neurologic:  alert & oriented X3.  Did not recall the president's name Speech normal, gait appropriate for age and unassisted Psych--  Cooperative with normal attention span and concentration.  Behavior appropriate. Seems very calm .      Assessment & Plan:   Assessment  Pre-diabetes HTN Hyperlipidemia Anxiety CAD s/p stents 2014  EtOH dependency Ulcerative colitis- last cscope 05-2015 GU: -Metastatic prostate cancer, DX 11-2015, radical prostatectomy 11-2015 --  continent as off  07-2016--on androgen depravation  -Hematuria, saw urology 01-2013 reviewed: CT show a renal cyst, cystoscopy negative, they recommended follow-up MRI for the renal cyst. MS  Dx 1991 (B neuritis, rx, resolved, felt to be d/t MS) Bilateral thenar atrophy noted 06-2017 (apparently chronic) Nose bleed: R nostril artery cauterize 08-2017  PLAN TGA: improving. @ the hospital,  MRI brain normal,   EEG was negative.  Interestingly, the wife reports "this is not the first time he acts like that". Plan: neuro referral  Anxiety: Worse, due to recent news regards losing his employment. We will increase sertraline  from 50 to 100 mg. Recommend to see a psychiatrist. I attempt to facilitate an appointment with the behavioral facility, after I talked to 5 people over the phone,  I was unable to get help.  Advised patient to call or go in person and keep working on an appointment;   hotline numbers provided, see AVS, ER if symptoms severe.  Currently he denies any suicidal ideas. Extensive paperwork filled; he needs a 6-week short-term disability. HTN: BP was elevated hospital however he had hypokalemia, BP normalized, HCTZ discontinue.  He is currently on carvedilol, amlodipine, lisinopril.  BP today is very good.  No change but monitor BPs.  Check a CMP, CBC. F/u 1 month.   Today, I spent more than  45  min with the patient: >50% of the time counseling regards TGA, anxiety Also: -reviewing the chart and labs ordered by other providers  -coordinating his care

## 2018-09-18 NOTE — Progress Notes (Signed)
Pre visit review using our clinic review tool, if applicable. No additional management support is needed unless otherwise documented below in the visit note. 

## 2018-09-18 NOTE — BH Assessment (Signed)
Assessment Note  Gary [name] is an 62 y.o. male presenting voluntarily to [facility] for a walk in assessment. Patient accessed [facility] on 12/10 seeking help for anxiety and stress management. Per note "As details were being discussed patient looked up and said 'I'm sorry but I have no idea what you are talking about.' We recapped a bit but patient restated he had no idea what we were talking about, or what was going on, or where he was." Patient was then transported to ED. Patient was medically cleared and no medical cause could be identified for amnesia. Patient reports to clinician that he was recently laid off from his job of 34 years and was offered a lesser position. Patient states he cannot bring himself to go back into the building and that he feels betrayed. Patient reports he is planning on taking a severance package. Patient state he needs documentation for short term disability for the rest of the year. Clinician informed him his physician would need to complete that paperwork. Patient denies SI/HI/AVH. Patient states he does feel anxious and has been on 25 mg of Zoloft for the past 6 years to help with anxiety. Patient reviewed his dosage was just increased to 50 mg. Patient endorses symptoms of excessive worry, shortness of breath, chest tightening, difficulty with concentration, and sweating.   Patient was alert and oriented x 4. He was dressed neatly. His speech was logical and coherent. His mood was anxious and pleasant. Affect was congruent. Patient did not appear to be responding to internal stimuli or experiencing delusional thought content.  Diagnosis: F43. 24 Adjustment disorder with anxiety  Past Medical History:  Past Medical History:  Diagnosis Date  . Anxiety   . CAD (coronary artery disease) 10-2012   h/o stents   . Elevated PSA    Prostate Bx w/ Dr. Tresa [name]  . EtOH dependence ([place])   . Family history of adverse reaction to anesthesia    made father disoriented  . Gross hematuria    . Hepatic steatosis   . Hyperlipidemia LDL goal < 70   . Hypertension   . Metastatic adenocarcinoma to prostate ([place]) 2017   Gleason score 9, metastasis to R external Iliac node and large extraprostatic extension at prostatectomy  . Multiple sclerosis ([place]) 1991   Bilateral optic neuritis, treated and resolved but felt to be a manifestation of MS.  . Renal cyst, left    Bosniak 2K (indeterminant) hyperdense cyst CT 2014, Stable CT in 2016  . Ulcerative colitis [facility])     Past Surgical History:  Procedure Laterality Date  . FOOT SURGERY     R toe Fx (screws),  L dorsal FX (screws)  . LEFT HEART CATHETERIZATION WITH CORONARY ANGIOGRAM N/A 10/22/2012   Procedure: LEFT HEART CATHETERIZATION WITH CORONARY ANGIOGRAM;  Surgeon: Sinclair Grooms, MD;  Location: [facility] CATH LAB;  Service: Cardiovascular;  Laterality: N/A;  . LYMPHADENECTOMY Bilateral 11/24/2015   Procedure: BILATERAL PELVIC LYMPHADENECTOMY;  Surgeon: Alexis Frock, MD;  Location: [facility] ORS;  Service: Urology;  Laterality: Bilateral;  . PERCUTANEOUS CORONARY STENT INTERVENTION (PCI-S) N/A 10/23/2012   Procedure: PERCUTANEOUS CORONARY STENT INTERVENTION (PCI-S);  Surgeon: Sinclair Grooms, MD;  Location: [facility] CATH LAB;  Service: Cardiovascular;  Laterality: N/A;  . PROSTATE BIOPSY  09/2015   Dr. Tresa [name]  . ROBOT ASSISTED LAPAROSCOPIC RADICAL PROSTATECTOMY N/A 11/24/2015   Procedure: XI ROBOTIC ASSISTED LAPAROSCOPIC RADICAL PROSTATECTOMY WITH INDOCYANINE GREEN DYE;  Surgeon: Alexis Frock, MD;  Location: [facility] ORS;  Service: Urology;  Laterality: N/A;  . VASECTOMY      Family History:  Family History  Problem Relation Age of Onset  . Dementia Mother   . Hypertension Mother   . Coronary artery disease Father   . Diabetes Mellitus II Father   . Lupus Sister   . Coronary artery disease Brother   . Diabetes Brother   . Hypertension Brother   . Prostate cancer Brother   . Thyroid cancer Sister   . Heart failure Sister   . Colon cancer  Neg Hx     Social History:  reports that he quit smoking about 28 years ago. He has quit using smokeless tobacco. He reports current alcohol use. He reports current drug use. Drug: Marijuana.  Additional Social History:  Alcohol / Drug Use Pain Medications: see MAR Prescriptions: see MAR Over the Counter: see MAR History of alcohol / drug use?: No history of alcohol / drug abuse  CIWA: CIWA-Ar BP: (!) 153/87 Pulse Rate: (!) 57 COWS:    Allergies: No Known Allergies  Home Medications: (Not in a hospital admission)   OB/GYN Status:  No LMP for male patient.  General Assessment Data Location of Assessment: [facility] Assessment Services TTS Assessment: In system Is this a Tele or Face-to-Face Assessment?: Face-to-Face Is this an Initial Assessment or a Re-assessment for this encounter?: Initial Assessment Patient Accompanied by:: N/A Language Other than English: No Living Arrangements: Other (Comment) What gender do you identify as?: Male Marital status: Married Living Arrangements: Spouse/significant other Can pt return to current living arrangement?: Yes Admission Status: Voluntary Is patient capable of signing voluntary admission?: Yes Referral Source: Self/Family/Friend Insurance type: [place] Living Arrangements: Spouse/significant other     Risk to self with the past 6 months Suicidal Ideation: No Has patient been a risk to self within the past 6 months prior to admission? : No Suicidal Intent: No Has patient had any suicidal intent within the past 6 months prior to admission? : No Is patient at risk for suicide?: No Suicidal Plan?: No Has patient had any suicidal plan within the past 6 months prior to admission? : No Access to Means: No What has been your use of drugs/alcohol within the last 12 months?: none Previous Attempts/Gestures: No How many times?: 0 Other Self Harm Risks: none Triggers for Past Attempts: None known Intentional Self  Injurious Behavior: None Family Suicide History: No Recent stressful life event(s): Financial Problems, Loss (Comment)(lost job, 2 sisters passed away) Persecutory voices/beliefs?: No Depression: No Depression Symptoms: Isolating, Feeling worthless/self pity Substance abuse history and/or treatment for substance abuse?: No Suicide prevention information given to non-admitted patients: Not applicable  Risk to Others within the past 6 months Homicidal Ideation: No Does patient have any lifetime risk of violence toward others beyond the six months prior to admission? : No Thoughts of Harm to Others: No Current Homicidal Intent: No Current Homicidal Plan: No Access to Homicidal Means: No Identified Victim: none reporter History of harm to others?: No Assessment of Violence: None Noted Violent Behavior Description: none Does patient have access to weapons?: No Criminal Charges Pending?: No Does patient have a court date: No Is patient on probation?: No  Psychosis Hallucinations: None noted Delusions: None noted  Mental Status Report Appearance/Hygiene: Unremarkable Eye Contact: Good Motor Activity: Freedom of movement Speech: Logical/coherent, Unremarkable Level of Consciousness: Alert Mood: Anxious Affect: Anxious Anxiety Level: Minimal Thought Processes: Coherent, Relevant Judgement: Unimpaired Orientation: Person, Place, Situation, Time  Obsessive Compulsive Thoughts/Behaviors: None  Cognitive Functioning Concentration: Decreased Memory: Recent Impaired, Remote Impaired Is patient IDD: No Insight: Fair Impulse Control: Good Appetite: Fair Have you had any weight changes? : No Change Sleep: No Change Total Hours of Sleep: 8 Vegetative Symptoms: None  ADLScreening [facility]) Patient's cognitive ability adequate to safely complete daily activities?: Yes Patient able to express need for assistance with ADLs?: Yes Independently performs ADLs?: Yes  (appropriate for developmental age)  Prior Inpatient Therapy Prior Inpatient Therapy: No  Prior Outpatient Therapy Prior Outpatient Therapy: Yes Prior Therapy Dates: (6 years agi) Prior Therapy Facilty/Provider(s): (unable to recall) Reason for Treatment: anxiety Does patient have an ACCT team?: No Does patient have Intensive In-House Services?  : No Does patient have [facility] services? : No Does patient have P4CC services?: No  ADL Screening (condition at time of admission) Patient's cognitive ability adequate to safely complete daily activities?: Yes Patient able to express need for assistance with ADLs?: Yes Independently performs ADLs?: Yes (appropriate for developmental age)       Abuse/Neglect Assessment (Assessment to be complete while patient is alone) Abuse/Neglect Assessment Can Be Completed: Yes Physical Abuse: Denies Verbal Abuse: Denies Sexual Abuse: Denies Exploitation of patient/patient's resources: Denies Self-Neglect: Denies Values / Beliefs Cultural Requests During Hospitalization: None Spiritual Requests During Hospitalization: None Consults Spiritual Care Consult Needed: No Social Work Consult Needed: No Regulatory affairs officer (For Healthcare) Does Patient Have a Medical Advance Directive?: No Would patient like information on creating a medical advance directive?: No - Patient declined          Disposition: Per Marvia Pickles, NP patient is psych cleared. Patient given outpatient resources. Disposition Initial Assessment Completed for this Encounter: Yes Disposition of Patient: Discharge Mode of transportation if patient is discharged/movement?: Car  On Site Evaluation by:   Reviewed with Physician:    Orvis Brill 09/18/2018 1:32 PM

## 2018-09-18 NOTE — H&P (Signed)
Behavioral Health Medical Screening Exam  Gary [name] is an 62 y.o. male.  Total Time spent with patient: 20 minutes  Psychiatric Specialty Exam: Physical Exam  Nursing note and vitals reviewed. Constitutional: He is oriented to person, place, and time. He appears well-developed and well-nourished.  Respiratory: Effort normal.  Musculoskeletal: Normal range of motion.  Neurological: He is alert and oriented to person, place, and time.  Skin: Skin is warm.    Review of Systems  Constitutional: Negative.   HENT: Negative.   Eyes: Negative.   Respiratory: Negative.   Cardiovascular: Negative.   Gastrointestinal: Negative.   Genitourinary: Negative.   Musculoskeletal: Negative.   Skin: Negative.   Neurological: Negative.   Endo/Heme/Allergies: Negative.   Psychiatric/Behavioral: Positive for depression. The patient is nervous/anxious.     Blood pressure (!) 153/87, pulse (!) 57, temperature (!) 97.5 F (36.4 C), resp. rate 16, SpO2 99 %.There is no height or weight on file to calculate BMI.  General Appearance: Casual  Eye Contact:  Good  Speech:  Clear and Coherent and Normal Rate  Volume:  Normal  Mood:  Anxious and Depressed  Affect:  Depressed  Thought Process:  Coherent and Descriptions of Associations: Intact  Orientation:  Full (Time, Place, and Person)  Thought Content:  WDL  Suicidal Thoughts:  No  Homicidal Thoughts:  No  Memory:  Immediate;   Good Recent;   Good Remote;   Good  Judgement:  Good  Insight:  Good  Psychomotor Activity:  Normal  Concentration: Concentration: Good and Attention Span: Good  Recall:  Good  Fund of Knowledge:Good  Language: Good  Akathisia:  No  Handed:  Right  AIMS (if indicated):     Assets:  Communication Skills Desire for Improvement Financial Resources/Insurance Housing Physical Health Social Support Transportation  Sleep:       Musculoskeletal: Strength & Muscle Tone: within normal limits Gait & Station:  normal Patient leans: N/A  Blood pressure (!) 153/87, pulse (!) 57, temperature (!) 97.5 F (36.4 C), resp. rate 16, SpO2 99 %.  Recommendations:  Based on my evaluation the patient does not appear to have an emergency medical condition.  [place], FNP 09/18/2018, 1:30 PM

## 2018-09-18 NOTE — Telephone Encounter (Signed)
Short-term disability forms received at [employer] today, forms semi-completed- have to wait to see who and when he will see at Neurology. Will need sent to [employer].

## 2018-09-22 ENCOUNTER — Encounter: Payer: Self-pay | Admitting: Neurology

## 2018-09-22 ENCOUNTER — Ambulatory Visit: Payer: BLUE CROSS/BLUE SHIELD | Admitting: Neurology

## 2018-09-22 ENCOUNTER — Other Ambulatory Visit: Payer: Self-pay

## 2018-09-22 VITALS — BP 141/81 | HR 50 | Resp 16 | Ht 72.0 in | Wt 248.5 lb

## 2018-09-22 DIAGNOSIS — F1021 Alcohol dependence, in remission: Secondary | ICD-10-CM

## 2018-09-22 DIAGNOSIS — F329 Major depressive disorder, single episode, unspecified: Secondary | ICD-10-CM

## 2018-09-22 DIAGNOSIS — F32A Depression, unspecified: Secondary | ICD-10-CM

## 2018-09-22 DIAGNOSIS — R202 Paresthesia of skin: Secondary | ICD-10-CM

## 2018-09-22 DIAGNOSIS — G454 Transient global amnesia: Secondary | ICD-10-CM

## 2018-09-22 DIAGNOSIS — Z820 Family history of epilepsy and other diseases of the nervous system: Secondary | ICD-10-CM

## 2018-09-22 DIAGNOSIS — R200 Anesthesia of skin: Secondary | ICD-10-CM

## 2018-09-22 DIAGNOSIS — I160 Hypertensive urgency: Secondary | ICD-10-CM

## 2018-09-22 DIAGNOSIS — F419 Anxiety disorder, unspecified: Secondary | ICD-10-CM | POA: Diagnosis not present

## 2018-09-22 NOTE — Progress Notes (Addendum)
[facility]  PATIENT: Gary [name] DOB: 1956-09-03  REFERRING DOCTOR OR PCP:  Dr. Larose Kells SOURCE: Patient, notes from hospital admission, laboratory results, imaging results, MRI images were personally reviewed on PACS.  _________________________________   HISTORICAL  CHIEF COMPLAINT:  Chief Complaint  Patient presents with  . Transient Global Amnesia    Rm. 12.  Gary [name] is here for eval of a single episode of transient global amnesia. Sts. on 09/08/18, he was told by his employer ([employer]) that he was going to be laid off at the end of this year. The next day, he "felt funny" so he left work and went to [employer]. Sts. his BP was 250/184, so he was sent to [facility], where he was admitted. Sts. MRI and EEG were ok, so he was d/c to f/u with neurology.  Sts. he has not been back to work and has applied for short term disability/fim  . Hx. of Optic Neuritis    Sts. in 1991, he had h/a, was seen at [facility], dx. with bilat optic neuritis, was admitted and received approx. 5 days of IV steroids.  Sts. sx. resolved, have not returned/fim    HISTORY OF PRESENT ILLNESS:  Had the pleasure of seeing your patient, Gary [name], at [facility] for neurologic consultation regarding his recent episode of amnesia, history of optic neuritis and numbness.  He had an episode of transient global amnesia.  He was told on 09/14/2018 that he would be laid off at the end of the year.  He reported to work the next morning, 09/15/2018 and had a normal morning.  He recalls leaving a meeting around 1 pm that afternoon.   He went to the [employer] after the meeting around 2 pm and did not feel right so he called [facility] near [place] and he remembers and was told that they took walk-ins.   He went to their waiting room and he remembers being there but he did not feel quite right. He was seen around 3-330 pm and does not remember  anything from that interaction. His BP was very elevated and he was sent to [facility].   He has no memory of the next 6-7 hours.   He was found to have a markedly elevated BP.   He was admitted.   He has no recall of about 6 hours for around 4 pm to around 10 pm.  His son saw him in the ED and he showed him a picture of some family and he knew only one name.  He doesn't recall this.   He then has recall of seeing his wife and sister in the hospital room and memory was fine after that.   He felt foggy at 10 pm and his family left around an hour later.   He recalls sleeping all night and much of the next day.   He was a little sleepy the next couple days.   He recalls having the MRI but was groggy (that was around 9 pm).    He had an EEG 09/16/2018 that was normal.    He was discharged 09/16/2018 and has been a little more tired and feels focus is a little worse but he is near baseline.      He has HTN and hyperlipidemia.  He is on Zytiga for metastatic prostate cancer.    He has depression and anxiety.   He has noted numbness in  his feet x 5-6 years and has high arches.   He is noting more difficulty with gait and often needs to look down.   He holds on to the wall when he shampoos his hair.   His mother and brother have Charcot-Marie-Tooth.    He followed up with Behavioral Health and was seen for follow-up.  He was increased to sertraline 100 mg.   Lab work on 09/15/2018 and 09/16/2018 showed low potassium.  CBC and other lab work was normal.  I personally reviewed the MRI of the brain performed 09/16/2018.  It is normal.  The report of the CT scan performed 09/15/2018 showed possible skull lesions.  His discharge summary states that he is to follow-up with urology for evaluation of the CT scan results.  He had optic neuritis starting on the left and then spreading to the right 20 years ago.   Vision was poor x months.   He was having headaches throughout.    He had an MRI and was told it was fine.    He had  IV Solu-medrol for a few days and felt he improved afterwards.    REVIEW OF SYSTEMS: Constitutional: No fevers, chills, sweats, or change in appetite Eyes: No visual changes, double vision, eye pain Ear, nose and throat: No hearing loss, ear pain, nasal congestion, sore throat Cardiovascular: No chest pain, palpitations Respiratory: No shortness of breath at rest or with exertion.   No wheezes GastrointestinaI: No nausea, vomiting, diarrhea, abdominal pain, fecal incontinence Genitourinary: No dysuria, urinary retention or frequency.  No nocturia. Musculoskeletal: No neck pain, back pain Integumentary: No rash, pruritus, skin lesions Neurological: as above Psychiatric: No depression at this time.  No anxiety Endocrine: No palpitations, diaphoresis, change in appetite, change in weigh or increased thirst Hematologic/Lymphatic: No anemia, purpura, petechiae. Allergic/Immunologic: No itchy/runny eyes, nasal congestion, recent allergic reactions, rashes  ALLERGIES: No Known Allergies  HOME MEDICATIONS:  Current Outpatient Medications:  .  amLODipine (NORVASC) 5 MG tablet, Take 1 tablet (5 mg total) by mouth daily., Disp: 90 tablet, Rfl: 1 .  aspirin 81 MG tablet, Take 81 mg by mouth daily., Disp: , Rfl:  .  atorvastatin (LIPITOR) 20 MG tablet, TAKE 1 TABLET BY MOUTH  DAILY (Patient taking differently: Take 20 mg by mouth daily. ), Disp: 90 tablet, Rfl: 1 .  carvedilol (COREG) 12.5 MG tablet, Take 1 tablet (12.5 mg total) by mouth 2 (two) times daily with a meal., Disp: 180 tablet, Rfl: 1 .  ferrous sulfate 325 (65 FE) MG tablet, Take 1 tablet (325 mg total) 2 (two) times daily with a meal by mouth., Disp: 60 tablet, Rfl: 3 .  levothyroxine (SYNTHROID, LEVOTHROID) 88 MCG tablet, Take 1 tablet (88 mcg total) by mouth daily before breakfast., Disp: 90 tablet, Rfl: 1 .  lisinopril (PRINIVIL,ZESTRIL) 40 MG tablet, Take 1 tablet (40 mg total) by mouth daily., Disp: 90 tablet, Rfl: 0 .   nitroGLYCERIN (NITROSTAT) 0.4 MG SL tablet, Place 1 tablet (0.4 mg total) under the tongue every 5 (five) minutes as needed for chest pain., Disp: 25 tablet, Rfl: 3 .  oxymetazoline (NASAL DECONGESTANT SPRAY) 0.05 % nasal spray, Place 2 sprays into both nostrils 2 (two) times daily as needed for congestion. As directed, Disp: , Rfl:  .  sertraline (ZOLOFT) 100 MG tablet, Take 1 tablet (100 mg total) by mouth daily., Disp: 30 tablet, Rfl: 0 .  sodium chloride (OCEAN) 0.65 % SOLN nasal spray, Place 1 spray into both nostrils  as needed for congestion., Disp: , Rfl:  .  ZYTIGA 250 MG tablet, TAKE 4 TABLETS BY MOUTH  ONCE DAILY ON AN EMPTY  STOMACH 1 HOUR BEFORE OR 2  HOURS AFTER A MEAL (Patient taking differently: Take 1,000 mg by mouth daily. ), Disp: 120 tablet, Rfl: 0  PAST MEDICAL HISTORY: Past Medical History:  Diagnosis Date  . Anxiety   . CAD (coronary artery disease) 10-2012   h/o stents   . Elevated PSA    Prostate Bx w/ Dr. Tresa [name]  . EtOH dependence ([place])   . Family history of adverse reaction to anesthesia    made father disoriented  . Gross hematuria   . Hepatic steatosis   . Hyperlipidemia LDL goal < 70   . Hypertension   . Metastatic adenocarcinoma to prostate ([place]) 2017   Gleason score 9, metastasis to R external Iliac node and large extraprostatic extension at prostatectomy  . Multiple sclerosis ([place]) 1991   Bilateral optic neuritis, treated and resolved but felt to be a manifestation of MS.  . Renal cyst, left    Bosniak 2K (indeterminant) hyperdense cyst CT 2014, Stable CT in 2016  . Ulcerative colitis ([place])     PAST SURGICAL HISTORY: Past Surgical History:  Procedure Laterality Date  . FOOT SURGERY     R toe Fx (screws),  L dorsal FX (screws)  . LEFT HEART CATHETERIZATION WITH CORONARY ANGIOGRAM N/A 10/22/2012   Procedure: LEFT HEART CATHETERIZATION WITH CORONARY ANGIOGRAM;  Surgeon: Sinclair Grooms, MD;  Location: [facility] C[facility] LAB;  Service: Cardiovascular;  Laterality:  N/A;  . LYMPHADENECTOMY Bilateral 11/24/2015   Procedure: BILATERAL PELVIC LYMPHADENECTOMY;  Surgeon: Alexis Frock, MD;  Location: [facility] ORS;  Service: Urology;  Laterality: Bilateral;  . PERCUTANEOUS CORONARY STENT INTERVENTION (PCI-S) N/A 10/23/2012   Procedure: PERCUTANEOUS CORONARY STENT INTERVENTION (PCI-S);  Surgeon: Sinclair Grooms, MD;  Location: [facility] CATH LAB;  Service: Cardiovascular;  Laterality: N/A;  . PROSTATE BIOPSY  09/2015   Dr. Tresa [name]  . ROBOT ASSISTED LAPAROSCOPIC RADICAL PROSTATECTOMY N/A 11/24/2015   Procedure: XI ROBOTIC ASSISTED LAPAROSCOPIC RADICAL PROSTATECTOMY WITH INDOCYANINE GREEN DYE;  Surgeon: Alexis Frock, MD;  Location: [facility] ORS;  Service: Urology;  Laterality: N/A;  . VASECTOMY      FAMILY HISTORY: Family History  Problem Relation Age of Onset  . Dementia Mother   . Hypertension Mother   . Coronary artery disease Father   . Diabetes Mellitus II Father   . Lupus Sister   . Coronary artery disease Brother   . Diabetes Brother   . Hypertension Brother   . Prostate cancer Brother   . Thyroid cancer Sister   . Heart failure Sister   . Colon cancer Neg Hx     SOCIAL HISTORY:  Social History   Socioeconomic History  . Marital status: Married    Spouse name: Not on file  . Number of children: 1  . Years of education: Not on file  . Highest education level: Not on file  Occupational History  . Occupation: Actuary: [employer]  Social Needs  . Financial resource strain: Not on file  . Food insecurity:    Worry: Not on file    Inability: Not on file  . Transportation needs:    Medical: Not on file    Non-medical: Not on file  Tobacco Use  . Smoking status: Former Smoker    Last attempt to quit: 07/18/1990    Years since  quitting: 28.2  . Smokeless tobacco: Former Systems developer  . Tobacco comment: 1991  Substance and Sexual Activity  . Alcohol use: Yes    Comment: weekly  . Drug use: Yes    Types: Marijuana  . Sexual activity: Not on file   Lifestyle  . Physical activity:    Days per week: Not on file    Minutes per session: Not on file  . Stress: Not on file  Relationships  . Social connections:    Talks on phone: Not on file    Gets together: Not on file    Attends religious service: Not on file    Active member of club or organization: Not on file    Attends meetings of clubs or organizations: Not on file    Relationship status: Not on file  . Intimate partner violence:    Fear of current or ex partner: Not on file    Emotionally abused: Not on file    Physically abused: Not on file    Forced sexual activity: Not on file  Other Topics Concern  . Not on file  Social History Narrative   Household-- pt and wife   Has an adult son     PHYSICAL EXAM  Vitals:   09/22/18 1440  BP: (!) 141/81  Pulse: (!) 50  Resp: 16  Weight: 248 lb 8 oz (112.7 kg)  Height: 6' (1.829 m)    Body mass index is 33.7 kg/m.   General: The patient is well-developed and well-nourished and in no acute distress  Eyes:  Funduscopic exam shows normal optic discs and retinal vessels.  Neck: The neck is supple, no carotid bruits are noted.  The neck is nontender.  Cardiovascular: The heart has a regular rate and rhythm with a normal S1 and S2. There were no murmurs, gallops or rubs. Lungs are clear to auscultation.  Skin: Extremities are without significant edema.  Musculoskeletal:  Back is nontender  Neurologic Exam  Mental status: The patient is alert and oriented x 3 at the time of the examination. The patient has apparent normal recent and remote memory, with an apparently normal attention span and concentration ability.   Speech is normal.  Cranial nerves: Extraocular movements are full. Pupils are equal, round, and reactive to light and accomodation.  Visual fields are full.  Facial symmetry is present. There is good facial sensation to soft touch bilaterally.Facial strength is normal.  Trapezius and sternocleidomastoid  strength is normal. No dysarthria is noted.  The tongue is midline, and the patient has symmetric elevation of the soft palate. No obvious hearing deficits are noted.  Motor:  Muscle bulk is normal.   Tone is normal. Strength is  5 / 5 in all 4 extremities.   Sensory: Addendum: He has a length dependent numbness in both legs with complete loss of vibration and pinprick sensation in the feet..  Coordination: Cerebellar testing reveals good finger-nose-finger and heel-to-shin bilaterally.  Gait and station: Station is normal.   Gait is normal. Tandem gait is normal. Romberg is negative.   Reflexes: Deep tendon reflexes are symmetric and normal bilaterally.   Plantar responses are flexor.    DIAGNOSTIC DATA (LABS, IMAGING, TESTING) - I reviewed patient records, labs, notes, testing and imaging myself where available.  Lab Results  Component Value Date   WBC 5.6 09/18/2018   HGB 15.2 09/18/2018   HCT 45.4 09/18/2018   MCV 83.3 09/18/2018   PLT 216.0 09/18/2018  Component Value Date/Time   NA 142 09/18/2018 1019   NA 140 08/27/2017 1259   K 3.9 09/18/2018 1019   K 3.8 08/27/2017 1259   CL 103 09/18/2018 1019   CO2 28 09/18/2018 1019   CO2 26 08/27/2017 1259   GLUCOSE 111 (H) 09/18/2018 1019   GLUCOSE 102 08/27/2017 1259   BUN 9 09/18/2018 1019   BUN 10.5 08/27/2017 1259   CREATININE 0.71 09/18/2018 1019   CREATININE 0.78 03/05/2018 0753   CREATININE 0.8 08/27/2017 1259   CALCIUM 9.9 09/18/2018 1019   CALCIUM 9.4 08/27/2017 1259   PROT 7.8 09/18/2018 1019   PROT 8.0 08/27/2017 1259   ALBUMIN 4.6 09/18/2018 1019   ALBUMIN 4.0 08/27/2017 1259   AST 22 09/18/2018 1019   AST 41 (H) 03/05/2018 0753   AST 16 08/27/2017 1259   ALT 37 09/18/2018 1019   ALT 58 (H) 03/05/2018 0753   ALT 23 08/27/2017 1259   ALKPHOS 101 09/18/2018 1019   ALKPHOS 91 08/27/2017 1259   BILITOT 0.8 09/18/2018 1019   BILITOT 0.6 03/05/2018 0753   BILITOT 0.35 08/27/2017 1259   GFRNONAA >60  09/16/2018 0552   GFRNONAA >60 03/05/2018 0753   GFRAA >60 09/16/2018 0552   GFRAA >60 03/05/2018 0753   Lab Results  Component Value Date   CHOL 153 08/19/2018   HDL 37.90 (L) 08/19/2018   LDLCALC 54 08/08/2017   LDLDIRECT 93.0 08/19/2018   TRIG 205.0 (H) 08/19/2018   CHOLHDL 4 08/19/2018   Lab Results  Component Value Date   HGBA1C 5.6 08/19/2018   No results found for: [id number] Lab Results  Component Value Date   TSH 1.52 08/19/2018       ASSESSMENT AND PLAN  Transient global amnesia - Plan: US Carotid Bilateral  Anxiety and depression  Hypertensive urgency  History of alcohol dependence (HCC)  Family history of neuropathy - Plan: NCV with EMG(electromyography)  Numbness and tingling of both feet - Plan: NCV with EMG(electromyography)   In summary, Mr. Galindo is a 62 year old man with a 6 or 7-hour period of amnesia most consistent with transient global amnesia.  A psychogenic fugue state is also possible as he was under a lot of stress with losing his job.  MRI and EEG was normal.  To complete the evaluation we need to check a carotid ultrasound to make sure that he does not have severe stenosis that could have played a role.  I discussed with him that if this was transient global amnesia, a second episode is very unlikely.  If due to psychiatric issues changes in his medication and counseling will hopefully lower the likelihood of another event.  A second issue is numbness in his feet and a family history of hereditary sensorimotor neuropathy (Charcot-Marie-Tooth).  We will check a nerve conduction and EMG study.  If it shows fairly uniform slowing in the arms and legs that would confirm CMT.  If normal, consider MRI of the spinal cord as he has severe numbness in his feet and some ataxia.  A third issue is optic neuritis many years ago and being told that he might have multiple sclerosis.  The normal MRI last week, many years after the optic neuritis, makes MS very  unlikely.     I will see him when he returns for the NCV/EMG.  He should call our office sooner if new or worsening neurologic symptoms.  Thank you for asking to see Mr. Lovena Le.  Please let me know if I  can be of further assistance with him or other patients in the future.   Richard A. Felecia Shelling, MD, PhD, FAAN Certified in Neurology, Clinical Neurophysiology, Sleep Medicine, Pain Medicine and Neuroimaging Director, [place] [facility] [address] [place], [place] [address] [phone]

## 2018-09-23 NOTE — Telephone Encounter (Signed)
Form completed and faxed to [employer] at [phone]. Form sent for scanning.

## 2018-09-29 ENCOUNTER — Telehealth: Payer: Self-pay

## 2018-09-29 DIAGNOSIS — F331 Major depressive disorder, recurrent, moderate: Secondary | ICD-10-CM | POA: Diagnosis not present

## 2018-09-29 NOTE — Telephone Encounter (Signed)
Received notification from [facility] that they have not been able to contact patient for delivery of Zytiga. Attempted to contact patient x2, no answer and unable to leave a message.

## 2018-10-02 ENCOUNTER — Other Ambulatory Visit: Payer: Self-pay | Admitting: Oncology

## 2018-10-02 DIAGNOSIS — C61 Malignant neoplasm of prostate: Secondary | ICD-10-CM

## 2018-10-05 ENCOUNTER — Telehealth: Payer: Self-pay | Admitting: Neurology

## 2018-10-05 DIAGNOSIS — G454 Transient global amnesia: Secondary | ICD-10-CM

## 2018-10-05 NOTE — Addendum Note (Signed)
Addended by: Hope Pigeon on: 10/05/2018 09:35 AM   Modules accepted: Orders

## 2018-10-05 NOTE — Telephone Encounter (Signed)
Re- Order [id number] carotid thanks Hinton Dyer

## 2018-10-09 ENCOUNTER — Ambulatory Visit (HOSPITAL_COMMUNITY): Payer: Commercial Managed Care - PPO

## 2018-10-15 ENCOUNTER — Ambulatory Visit (HOSPITAL_COMMUNITY)
Admission: RE | Admit: 2018-10-15 | Discharge: 2018-10-15 | Disposition: A | Discharge status: Home / Self Care | Payer: Commercial Managed Care - PPO | Source: Ambulatory Visit | Attending: Neurology | Admitting: Neurology

## 2018-10-15 ENCOUNTER — Telehealth: Payer: Self-pay | Admitting: *Deleted

## 2018-10-15 DIAGNOSIS — G454 Transient global amnesia: Secondary | ICD-10-CM | POA: Insufficient documentation

## 2018-10-15 NOTE — Progress Notes (Signed)
Carotid duplex exam completed. Please see more details on CV PROC under chart review.  Ulysess Witz H Wanna Gully(RDMS RVT) 10/15/18 10:56 AM

## 2018-10-15 NOTE — Telephone Encounter (Signed)
-----   Message from Britt Bottom, MD sent at 10/15/2018  4:44 PM EST ----- Please let the patient know that the carotid doppler was normal for age

## 2018-10-15 NOTE — Telephone Encounter (Signed)
Spoke to patient to notify him of the carotid doppler results.

## 2018-10-20 ENCOUNTER — Encounter: Payer: Self-pay | Admitting: Internal Medicine

## 2018-10-20 ENCOUNTER — Ambulatory Visit: Payer: Commercial Managed Care - PPO | Admitting: Internal Medicine

## 2018-10-20 VITALS — BP 136/84 | HR 55 | Temp 98.0°F | Resp 16 | Ht 72.0 in | Wt 241.2 lb

## 2018-10-20 DIAGNOSIS — F419 Anxiety disorder, unspecified: Secondary | ICD-10-CM

## 2018-10-20 DIAGNOSIS — F329 Major depressive disorder, single episode, unspecified: Secondary | ICD-10-CM

## 2018-10-20 DIAGNOSIS — G454 Transient global amnesia: Secondary | ICD-10-CM

## 2018-10-20 DIAGNOSIS — F32A Depression, unspecified: Secondary | ICD-10-CM

## 2018-10-20 NOTE — Progress Notes (Signed)
Pre visit review using our clinic review tool, if applicable. No additional management support is needed unless otherwise documented below in the visit note. 

## 2018-10-20 NOTE — Assessment & Plan Note (Signed)
Anxiety: Currently on sertraline, saw psychiatry, a medication was added, trazodone?. Denies suicidal ideas but he still very anxious and concerned about his future. Has not been able to secure counseling just yet but is working on it. TGA: Saw neurology 09/22/2018, they confirmed that symptoms were consistent with TGA, psychogenic fugue was also possible due to stress. They also stated a second episode was unlikely. They rx a carotid ultrasound 1 to 39% bilaterally on 10/15/2018. Paresthesias: At the time of the neurology eval he complained of paresthesias, they recommended NCS and EMG and consider MRI of the spine. Short-term disability: Will end 10/29/2018, he needs more time, still very anxious.  Will bring paperwork will extended for at least 1 month, possibly 2.  No charge. RTC 3 months

## 2018-10-20 NOTE — Patient Instructions (Signed)
  GO TO THE FRONT DESK Schedule your next appointment for a  Check up in 3 months

## 2018-10-20 NOTE — Progress Notes (Addendum)
Subjective:    Patient ID: Gary [name], male    DOB: [birth year], 63 y.o.   MRN: [id number]  DOS:  10/20/2018 Type of visit - description: f/u TGA: No further symptoms, saw neurology, note reviewed Anxiety: Saw psychiatry, sleeping medication was added. Counselor pending. EtOH: Not drinking, had one beer in the last 2 weeks.   Review of Systems  Denies suicidal ideas Still anxious, cannot sleep well, very worried about his future.  Past Medical History:  Diagnosis Date  . Anxiety   . CAD (coronary artery disease) 10-2012   h/o stents   . Elevated PSA    Prostate Bx w/ Dr. Tresa [name]  . EtOH dependence ([place])   . Family history of adverse reaction to anesthesia    made father disoriented  . Gross hematuria   . Hepatic steatosis   . Hyperlipidemia LDL goal < 70   . Hypertension   . Metastatic adenocarcinoma to prostate ([place]) 2017   Gleason score 9, metastasis to R external Iliac node and large extraprostatic extension at prostatectomy  . Multiple sclerosis ([place]) 1991   Bilateral optic neuritis, treated and resolved but felt to be a manifestation of MS.  . Renal cyst, left    Bosniak 2K (indeterminant) hyperdense cyst CT 2014, Stable CT in 2016  . Ulcerative colitis [facility])     Past Surgical History:  Procedure Laterality Date  . FOOT SURGERY     R toe Fx (screws),  L dorsal FX (screws)  . LEFT HEART CATHETERIZATION WITH CORONARY ANGIOGRAM N/A 10/22/2012   Procedure: LEFT HEART CATHETERIZATION WITH CORONARY ANGIOGRAM;  Surgeon: Sinclair Grooms, MD;  Location: [facility] CATH LAB;  Service: Cardiovascular;  Laterality: N/A;  . LYMPHADENECTOMY Bilateral 11/24/2015   Procedure: BILATERAL PELVIC LYMPHADENECTOMY;  Surgeon: Alexis Frock, MD;  Location: [facility] ORS;  Service: Urology;  Laterality: Bilateral;  . PERCUTANEOUS CORONARY STENT INTERVENTION (PCI-S) N/A 10/23/2012   Procedure: PERCUTANEOUS CORONARY STENT INTERVENTION (PCI-S);  Surgeon: Sinclair Grooms, MD;  Location[facility] C[facility] LAB;   Service: Cardiovascular;  Laterality: N/A;  . PROSTATE BIOPSY  09/2015   Dr. Tresa [name]  . ROBOT ASSISTED LAPAROSCOPIC RADICAL PROSTATECTOMY N/A 11/24/2015   Procedure: XI ROBOTIC ASSISTED LAPAROSCOPIC RADICAL PROSTATECTOMY WITH INDOCYANINE GREEN DYE;  Surgeon: Alexis Frock, MD;  Location: [facility] ORS;  Service: Urology;  Laterality: N/A;  . VASECTOMY      Social History   Socioeconomic History  . Marital status: Married    Spouse name: Not on file  . Number of children: 1  . Years of education: Not on file  . Highest education level: Not on file  Occupational History  . Occupation: Actuary: [employer]  Social Needs  . Financial resource strain: Not on file  . Food insecurity:    Worry: Not on file    Inability: Not on file  . Transportation needs:    Medical: Not on file    Non-medical: Not on file  Tobacco Use  . Smoking status: Former Smoker    Last attempt to quit: 07/18/1990    Years since quitting: 28.2  . Smokeless tobacco: Former Systems developer  . Tobacco comment: 1991  Substance and Sexual Activity  . Alcohol use: Yes    Comment: weekly  . Drug use: Yes    Types: Marijuana  . Sexual activity: Not on file  Lifestyle  . Physical activity:    Days per week: Not on file    Minutes per session:  Not on file  . Stress: Not on file  Relationships  . Social connections:    Talks on phone: Not on file    Gets together: Not on file    Attends religious service: Not on file    Active member of club or organization: Not on file    Attends meetings of clubs or organizations: Not on file    Relationship status: Not on file  . Intimate partner violence:    Fear of current or ex partner: Not on file    Emotionally abused: Not on file    Physically abused: Not on file    Forced sexual activity: Not on file  Other Topics Concern  . Not on file  Social History Narrative   Household-- pt and wife   Has an adult son      Allergies as of 10/20/2018   No Known Allergies       Medication List       Accurate as of October 20, 2018  6:47 PM. Always use your most recent med list.        amLODipine 5 MG tablet Commonly known as:  NORVASC Take 1 tablet (5 mg total) by mouth daily.   aspirin 81 MG tablet Take 81 mg by mouth daily.   atorvastatin 20 MG tablet Commonly known as:  LIPITOR TAKE 1 TABLET BY MOUTH  DAILY   carvedilol 12.5 MG tablet Commonly known as:  COREG Take 1 tablet (12.5 mg total) by mouth 2 (two) times daily with a meal.   levothyroxine 88 MCG tablet Commonly known as:  SYNTHROID, LEVOTHROID Take 1 tablet (88 mcg total) by mouth daily before breakfast.   lisinopril 40 MG tablet Commonly known as:  PRINIVIL,ZESTRIL Take 1 tablet (40 mg total) by mouth daily.   NASAL DECONGESTANT SPRAY 0.05 % nasal spray Generic drug:  oxymetazoline Place 2 sprays into both nostrils 2 (two) times daily as needed for congestion. As directed   nitroGLYCERIN 0.4 MG SL tablet Commonly known as:  NITROSTAT Place 1 tablet (0.4 mg total) under the tongue every 5 (five) minutes as needed for chest pain.   sertraline 100 MG tablet Commonly known as:  ZOLOFT Take 1 tablet (100 mg total) by mouth daily.   sodium chloride 0.65 % Soln nasal spray Commonly known as:  OCEAN Place 1 spray into both nostrils as needed for congestion.   ZYTIGA 250 MG tablet Generic drug:  abiraterone acetate TAKE 4 TABLETS BY MOUTH  ONCE DAILY ON AN EMPTY  STOMACH 1 HOUR BEFORE OR 2  HOURS AFTER A MEAL           Objective:   Physical Exam BP 136/84 (BP Location: Left Arm, Patient Position: Sitting, Cuff Size: Normal)   Pulse (!) 55   Temp 98 F (36.7 C) (Oral)   Resp 16   Ht 6' (1.829 m)   Wt 241 lb 4 oz (109.4 kg)   SpO2 97%   BMI 32.72 kg/m  General:   Well developed, NAD, BMI noted. HEENT:  Normocephalic . Face symmetric, atraumatic Skin: Not pale. Not jaundice Neurologic:  alert & oriented X3.  Speech normal, gait appropriate for age and  unassisted Psych--  Cognition and judgment appear intact.  Cooperative with normal attention span and concentration.  Behavior appropriate. Slightly anxious, not depressed appearing      Assessment     Assessment  Pre-diabetes HTN Hyperlipidemia Anxiety CAD s/p stents 2014  EtOH dependency Ulcerative colitis- last cscope  05-2015 GU: -Metastatic prostate cancer, DX 11-2015, radical prostatectomy 11-2015 --  continent as off  07-2016--on androgen depravation  -Hematuria, saw urology 01-2013 reviewed: CT show a renal cyst, cystoscopy negative, they recommended follow-up MRI for the renal cyst. MS  Dx 1991 (B neuritis, rx, resolved, felt to be d/t MS) Bilateral thenar atrophy noted 06-2017 (apparently chronic) Nose bleed: R nostril artery cauterize 08-2017 TGA: DX 09/2018, MRI brain, EEG negative saw neurology in follow-up, carotid ultrasound ordered: 1 to 39% bilaterally.  PLAN Anxiety: Currently on sertraline, saw psychiatry, a medication was added, trazodone?. Denies suicidal ideas but he still very anxious and concerned about his future. Has not been able to secure counseling just yet but is working on it. TGA: Saw neurology 09/22/2018, they confirmed that symptoms were consistent with TGA, psychogenic fugue was also possible due to stress. They also stated a second episode was unlikely. They rx a carotid ultrasound 1 to 39% bilaterally on 10/15/2018. Paresthesias: At the time of the neurology eval he complained of paresthesias, they recommended NCS and EMG and consider MRI of the spine. Short-term disability: Will end 10/29/2018, he needs more time, still very anxious.  Will bring paperwork will extended for at least 1 month, possibly 2.  No charge. RTC 3 months

## 2018-10-21 ENCOUNTER — Encounter: Payer: Self-pay | Admitting: Internal Medicine

## 2018-10-21 DIAGNOSIS — H00024 Hordeolum internum left upper eyelid: Secondary | ICD-10-CM | POA: Diagnosis not present

## 2018-10-22 ENCOUNTER — Telehealth: Payer: Self-pay | Admitting: *Deleted

## 2018-10-22 NOTE — Telephone Encounter (Signed)
Received request for more information on pt's disability claim, including notes and capacity questionnaire, completed as much as possible; forwarded to provider with OV notes attached/SLS 01/16

## 2018-11-12 ENCOUNTER — Telehealth: Payer: Self-pay

## 2018-11-12 NOTE — Telephone Encounter (Signed)
Oral Oncology Patient Advocate Encounter  Prior Authorization for Gary [name] has been approved.    PA# [id number] Effective dates: 11/11/18 through 11/12/19  Oral Oncology Clinic will continue to follow.   [place] Patient [name] Phone [phone] Fax [phone]

## 2018-12-11 DIAGNOSIS — M170 Bilateral primary osteoarthritis of knee: Secondary | ICD-10-CM | POA: Diagnosis not present

## 2018-12-15 ENCOUNTER — Other Ambulatory Visit: Payer: Self-pay | Admitting: Internal Medicine

## 2018-12-15 ENCOUNTER — Encounter: Payer: Self-pay | Admitting: Internal Medicine

## 2018-12-15 ENCOUNTER — Ambulatory Visit: Payer: Commercial Managed Care - PPO | Admitting: Internal Medicine

## 2018-12-15 VITALS — BP 126/76 | HR 67 | Temp 97.7°F | Resp 16 | Ht 72.0 in | Wt 235.2 lb

## 2018-12-15 DIAGNOSIS — F329 Major depressive disorder, single episode, unspecified: Secondary | ICD-10-CM

## 2018-12-15 DIAGNOSIS — F32A Depression, unspecified: Secondary | ICD-10-CM

## 2018-12-15 DIAGNOSIS — E785 Hyperlipidemia, unspecified: Secondary | ICD-10-CM

## 2018-12-15 DIAGNOSIS — F419 Anxiety disorder, unspecified: Secondary | ICD-10-CM | POA: Diagnosis not present

## 2018-12-15 DIAGNOSIS — G454 Transient global amnesia: Secondary | ICD-10-CM

## 2018-12-15 LAB — LIPID PANEL
Cholesterol: 149 mg/dL (ref 0–200)
HDL: 44.8 mg/dL (ref >39.00)
LDL Cholesterol: 74 mg/dL (ref 0–99)
NONHDL: 104.32
Total CHOL/HDL Ratio: 3
Triglycerides: 154 mg/dL — ABNORMAL HIGH (ref 0.0–149.0)
VLDL: 30.8 mg/dL (ref 0.0–40.0)

## 2018-12-15 MED ORDER — LISINOPRIL 40 MG PO TABS: 40.0000 mg | ORAL_TABLET | Freq: Every day | ORAL | 1 refills | Status: DC

## 2018-12-15 MED ORDER — LEVOTHYROXINE SODIUM 88 MCG PO TABS: 88.0000 ug | ORAL_TABLET | Freq: Every day | ORAL | 0 refills | Status: DC

## 2018-12-15 MED ORDER — LEVOTHYROXINE SODIUM 88 MCG PO TABS: 88.0000 ug | ORAL_TABLET | Freq: Every day | ORAL | 1 refills | Status: DC

## 2018-12-15 NOTE — Progress Notes (Signed)
Pre visit review using our clinic review tool, if applicable. No additional management support is needed unless otherwise documented below in the visit note. 

## 2018-12-15 NOTE — Patient Instructions (Signed)
GO TO THE LAB : Get the blood work     GO TO THE FRONT DESK Schedule your next appointment for a   checkup in 4 months  

## 2018-12-15 NOTE — Progress Notes (Signed)
Subjective:    Patient ID: Gary [name], male    DOB: [birth year], 63 y.o.   MRN: [id number]  DOS:  12/15/2018 Type of visit - description: Follow-up Since the last office visit, he had some counseling, feeling great emotionally. Prostate cancer, he anticipates will have some problems affording his medicines as his insurance ends soon. Needs a refill on Synthroid Needs FLP    Review of Systems Denies chest pain or difficulty breathing No nausea, vomiting, diarrhea  Past Medical History:  Diagnosis Date  . Anxiety   . CAD (coronary artery disease) 10-2012   h/o stents   . Elevated PSA    Prostate Bx w/ Dr. Tresa [name]  . EtOH dependence ([place])   . Family history of adverse reaction to anesthesia    made father disoriented  . Gross hematuria   . Hepatic steatosis   . Hyperlipidemia LDL goal < 70   . Hypertension   . Metastatic adenocarcinoma to prostate ([place]) 2017   Gleason score 9, metastasis to R external Iliac node and large extraprostatic extension at prostatectomy  . Multiple sclerosis ([place]) 1991   Bilateral optic neuritis, treated and resolved but felt to be a manifestation of MS.  . Renal cyst, left    Bosniak 2K (indeterminant) hyperdense cyst CT 2014, Stable CT in 2016  . Ulcerative colitis [facility])     Past Surgical History:  Procedure Laterality Date  . FOOT SURGERY     R toe Fx (screws),  L dorsal FX (screws)  . LEFT HEART CATHETERIZATION WITH CORONARY ANGIOGRAM N/A 10/22/2012   Procedure: LEFT HEART CATHETERIZATION WITH CORONARY ANGIOGRAM;  Surgeon: Sinclair Grooms, MD;  Location: [facility] CATH LAB;  Service: Cardiovascular;  Laterality: N/A;  . LYMPHADENECTOMY Bilateral 11/24/2015   Procedure: BILATERAL PELVIC LYMPHADENECTOMY;  Surgeon: Alexis Frock, MD;  Location: [facility] ORS;  Service: Urology;  Laterality: Bilateral;  . PERCUTANEOUS CORONARY STENT INTERVENTION (PCI-S) N/A 10/23/2012   Procedure: PERCUTANEOUS CORONARY STENT INTERVENTION (PCI-S);  Surgeon: Sinclair Grooms, MD;  Location: [facility] CATH LAB;  Service: Cardiovascular;  Laterality: N/A;  . PROSTATE BIOPSY  09/2015   Dr. Tresa [name]  . ROBOT ASSISTED LAPAROSCOPIC RADICAL PROSTATECTOMY N/A 11/24/2015   Procedure: XI ROBOTIC ASSISTED LAPAROSCOPIC RADICAL PROSTATECTOMY WITH INDOCYANINE GREEN DYE;  Surgeon: Alexis Frock, MD;  Location: [facility] ORS;  Service: Urology;  Laterality: N/A;  . VASECTOMY      Social History   Socioeconomic History  . Marital status: Married    Spouse name: Not on file  . Number of children: 1  . Years of education: Not on file  . Highest education level: Not on file  Occupational History  . Occupation: Actuary: [employer]  Social Needs  . Financial resource strain: Not on file  . Food insecurity:    Worry: Not on file    Inability: Not on file  . Transportation needs:    Medical: Not on file    Non-medical: Not on file  Tobacco Use  . Smoking status: Former Smoker    Last attempt to quit: 07/18/1990    Years since quitting: 28.4  . Smokeless tobacco: Former Systems developer  . Tobacco comment: 1991  Substance and Sexual Activity  . Alcohol use: Yes    Comment: weekly  . Drug use: Yes    Types: Marijuana  . Sexual activity: Not on file  Lifestyle  . Physical activity:    Days per week: Not on file  Minutes per session: Not on file  . Stress: Not on file  Relationships  . Social connections:    Talks on phone: Not on file    Gets together: Not on file    Attends religious service: Not on file    Active member of club or organization: Not on file    Attends meetings of clubs or organizations: Not on file    Relationship status: Not on file  . Intimate partner violence:    Fear of current or ex partner: Not on file    Emotionally abused: Not on file    Physically abused: Not on file    Forced sexual activity: Not on file  Other Topics Concern  . Not on file  Social History Narrative   Household-- pt and wife   Has an adult son      Allergies as of  12/15/2018   No Known Allergies     Medication List       Accurate as of December 15, 2018 11:59 PM. Always use your most recent med list.        amLODipine 5 MG tablet Commonly known as:  NORVASC Take 1 tablet (5 mg total) by mouth daily.   aspirin 81 MG tablet Take 81 mg by mouth daily.   atorvastatin 20 MG tablet Commonly known as:  LIPITOR TAKE 1 TABLET BY MOUTH  DAILY   carvedilol 12.5 MG tablet Commonly known as:  COREG Take 1 tablet (12.5 mg total) by mouth 2 (two) times daily with a meal.   levothyroxine 88 MCG tablet Commonly known as:  SYNTHROID, LEVOTHROID Take 1 tablet (88 mcg total) by mouth daily before breakfast.   lisinopril 40 MG tablet Commonly known as:  PRINIVIL,ZESTRIL Take 1 tablet (40 mg total) by mouth daily.   Nasal Decongestant Spray 0.05 % nasal spray Generic drug:  oxymetazoline Place 2 sprays into both nostrils 2 (two) times daily as needed for congestion. As directed   nitroGLYCERIN 0.4 MG SL tablet Commonly known as:  NITROSTAT Place 1 tablet (0.4 mg total) under the tongue every 5 (five) minutes as needed for chest pain.   sertraline 100 MG tablet Commonly known as:  ZOLOFT Take 1 tablet (100 mg total) by mouth daily.   sodium chloride 0.65 % Soln nasal spray Commonly known as:  OCEAN Place 1 spray into both nostrils as needed for congestion.   Zytiga 250 MG tablet Generic drug:  abiraterone acetate TAKE 4 TABLETS BY MOUTH  ONCE DAILY ON AN EMPTY  STOMACH 1 HOUR BEFORE OR 2  HOURS AFTER A MEAL           Objective:   Physical Exam BP 126/76 (BP Location: Left Arm, Patient Position: Sitting, Cuff Size: Normal)   Pulse 67   Temp 97.7 F (36.5 C) (Oral)   Resp 16   Ht 6' (1.829 m)   Wt 235 lb 4 oz (106.7 kg)   SpO2 96%   BMI 31.91 kg/m  General:   Well developed, NAD, BMI noted. HEENT:  Normocephalic . Face symmetric, atraumatic Lungs:  CTA B Normal respiratory effort, no intercostal retractions, no accessory muscle  use. Heart: RRR,  no murmur.  No pretibial edema bilaterally  Skin: Not pale. Not jaundice Neurologic:  alert & oriented X3.  Speech normal, gait appropriate for age and unassisted Psych--  Cognition and judgment appear intact.  Cooperative with normal attention span and concentration.  Behavior appropriate. No anxious or depressed appearing.  Assessment    Assessment  Pre-diabetes HTN Hyperlipidemia Anxiety CAD s/p stents 2014  EtOH dependency Ulcerative colitis- last cscope 05-2015 GU: -Metastatic prostate cancer, DX 11-2015, radical prostatectomy 11-2015 --  continent as off  07-2016--on androgen depravation  -Hematuria, saw urology 01-2013 reviewed: CT show a renal cyst, cystoscopy negative, they recommended follow-up MRI for the renal cyst. MS  Dx 1991 (B neuritis, rx, resolved, felt to be d/t MS) Bilateral thenar atrophy noted 06-2017 (apparently chronic) Nose bleed: R nostril artery cauterize 08-2017 TGA: DX 09/2018, MRI brain, EEG negative saw neurology in follow-up, carotid ultrasound ordered: 1 to 39% bilaterally.  PLAN Anxiety: Since the last office visit, he continue with sertraline, has not needed any insomnia medication.  Saw a counselor named Briscoe Deutscher, it was of great help, doing very well emotionally. TGA: No further symptoms Hyperlipidemia: Last LDL not at goal, currently on Lipitor 20, last LFTs normal, check a FLP Prostate cancer: Plans to talk with oncology regards his next Lupron and Zytiga prescription.  See next Social: On short-term disability until the end of March, after that apparently he will not have medical insurance.  Encouraged to continue coming to this office, he likely will qualify for financial assistance RTC 4 months

## 2018-12-16 NOTE — Assessment & Plan Note (Signed)
Anxiety: Since the last office visit, he continue with sertraline, has not needed any insomnia medication.  Saw a counselor named Briscoe Deutscher, it was of great help, doing very well emotionally. TGA: No further symptoms Hyperlipidemia: Last LDL not at goal, currently on Lipitor 20, last LFTs normal, check a FLP Prostate cancer: Plans to talk with oncology regards his next Lupron and Zytiga prescription.  See next Social: On short-term disability until the end of March, after that apparently he will not have medical insurance.  Encouraged to continue coming to this office, he likely will qualify for financial assistance RTC 4 months

## 2018-12-22 ENCOUNTER — Encounter: Payer: Self-pay | Admitting: Internal Medicine

## 2018-12-30 ENCOUNTER — Telehealth: Payer: Self-pay

## 2018-12-30 NOTE — Telephone Encounter (Signed)
Copied from [place] [phone]. Topic: Medical Record Request - Other >> Dec 30, 2018  1:08 PM Reyne Dumas L wrote: Lorin Mercy with [employer] called and left message on [facility] Voicemail box on 12/29/2018.  States she needs to confirm pt was seen on 12/15/2018 and that his return to work date was still listed as 12/28/2018. Lelon Frohlich can be reached at [phone]

## 2018-12-30 NOTE — Telephone Encounter (Signed)
LMOM for Ann requesting call back.

## 2018-12-31 NOTE — Telephone Encounter (Signed)
Ann called back and left voicemail on [employer] - states she can be reached at [phone], claim number [id number]

## 2019-01-01 NOTE — Telephone Encounter (Signed)
Spoke w/ Ann at [place]- informed her that Pt was seen on 12/15/2018 and was released back to work on 12/28/2018. Ann verbalized understanding.

## 2019-01-06 ENCOUNTER — Other Ambulatory Visit: Payer: Self-pay | Admitting: Internal Medicine

## 2019-01-07 MED ORDER — SERTRALINE HCL 100 MG PO TABS: 100.0000 mg | ORAL_TABLET | Freq: Every day | ORAL | 1 refills | Status: DC

## 2019-01-14 ENCOUNTER — Encounter: Payer: Self-pay | Admitting: Internal Medicine

## 2019-01-18 ENCOUNTER — Other Ambulatory Visit: Payer: Self-pay | Admitting: Oncology

## 2019-01-18 DIAGNOSIS — C61 Malignant neoplasm of prostate: Secondary | ICD-10-CM

## 2019-01-21 ENCOUNTER — Other Ambulatory Visit: Payer: Self-pay

## 2019-01-21 ENCOUNTER — Ambulatory Visit (INDEPENDENT_AMBULATORY_CARE_PROVIDER_SITE_OTHER): Payer: Commercial Managed Care - PPO | Admitting: Internal Medicine

## 2019-01-21 DIAGNOSIS — F419 Anxiety disorder, unspecified: Secondary | ICD-10-CM

## 2019-01-21 DIAGNOSIS — L309 Dermatitis, unspecified: Secondary | ICD-10-CM | POA: Diagnosis not present

## 2019-01-21 DIAGNOSIS — F32A Depression, unspecified: Secondary | ICD-10-CM

## 2019-01-21 DIAGNOSIS — F329 Major depressive disorder, single episode, unspecified: Secondary | ICD-10-CM

## 2019-01-21 MED ORDER — PERMETHRIN 5 % EX CREA: 1.0000 "application " | TOPICAL_CREAM | Freq: Once | CUTANEOUS | 0 refills | Status: AC

## 2019-01-21 NOTE — Progress Notes (Signed)
Subjective:    Patient ID: Gary [name], male    DOB: 08/27/1956, 63 y.o.   MRN: [id number]  DOS:  01/21/2019 Type of visit - description:  Virtual Visit via Video Note  I connected with@ on 01/21/19 at  8:20 AM EDT by a video enabled telemedicine application and verified that I am speaking with the correct person using two identifiers.   THIS ENCOUNTER IS A VIRTUAL VISIT DUE TO COVID-19 - PATIENT WAS NOT SEEN IN THE OFFICE. PATIENT HAS CONSENTED TO VIRTUAL VISIT / TELEMEDICINE VISIT   Location of patient: home  Location of provider: office  I discussed the limitations of evaluation and management by telemedicine and the availability of in person appointments. The patient expressed understanding and agreed to proceed.  History of Present Illness: Acute visit Needs a refill on Elimite, he plans to do some yard work frequently he gets mites from it. Otherwise feeling well.   Review of Systems  Emotionally doing okay. Weight about the same, maybe 1 or 2 pounds weight loss.  Past Medical History:  Diagnosis Date  . Anxiety   . CAD (coronary artery disease) 10-2012   h/o stents   . Elevated PSA    Prostate Bx w/ Dr. Tresa [name]  . EtOH dependence ([place])   . Family history of adverse reaction to anesthesia    made father disoriented  . Gross hematuria   . Hepatic steatosis   . Hyperlipidemia LDL goal < 70   . Hypertension   . Metastatic adenocarcinoma to prostate ([place]) 2017   Gleason score 9, metastasis to R external Iliac node and large extraprostatic extension at prostatectomy  . Multiple sclerosis ([place]) 1991   Bilateral optic neuritis, treated and resolved but felt to be a manifestation of MS.  . Renal cyst, left    Bosniak 2K (indeterminant) hyperdense cyst CT 2014, Stable CT in 2016  . Ulcerative colitis [facility])     Past Surgical History:  Procedure Laterality Date  . FOOT SURGERY     R toe Fx (screws),  L dorsal FX (screws)  . LEFT HEART CATHETERIZATION WITH CORONARY  ANGIOGRAM N/A 10/22/2012   Procedure: LEFT HEART CATHETERIZATION WITH CORONARY ANGIOGRAM;  Surgeon: Sinclair Grooms, MD;  Location: [facility] CATH LAB;  Service: Cardiovascular;  Laterality: N/A;  . LYMPHADENECTOMY Bilateral 11/24/2015   Procedure: BILATERAL PELVIC LYMPHADENECTOMY;  Surgeon: Alexis Frock, MD;  Location: [facility] ORS;  Service: Urology;  Laterality: Bilateral;  . PERCUTANEOUS CORONARY STENT INTERVENTION (PCI-S) N/A 10/23/2012   Procedure: PERCUTANEOUS CORONARY STENT INTERVENTION (PCI-S);  Surgeon: Sinclair Grooms, MD;  Location: [facility] CATH LAB;  Service: Cardiovascular;  Laterality: N/A;  . PROSTATE BIOPSY  09/2015   Dr. Tresa [name]  . ROBOT ASSISTED LAPAROSCOPIC RADICAL PROSTATECTOMY N/A 11/24/2015   Procedure: XI ROBOTIC ASSISTED LAPAROSCOPIC RADICAL PROSTATECTOMY WITH INDOCYANINE GREEN DYE;  Surgeon: Alexis Frock, MD;  Location: [facility] ORS;  Service: Urology;  Laterality: N/A;  . VASECTOMY      Social History   Socioeconomic History  . Marital status: Married    Spouse name: Not on file  . Number of children: 1  . Years of education: Not on file  . Highest education level: Not on file  Occupational History  . Occupation: Actuary: [employer]  Social Needs  . Financial resource strain: Not on file  . Food insecurity:    Worry: Not on file    Inability: Not on file  . Transportation needs:  Medical: Not on file    Non-medical: Not on file  Tobacco Use  . Smoking status: Former Smoker    Last attempt to quit: 07/18/1990    Years since quitting: 28.5  . Smokeless tobacco: Former Systems developer  . Tobacco comment: 1991  Substance and Sexual Activity  . Alcohol use: Yes    Comment: weekly  . Drug use: Yes    Types: Marijuana  . Sexual activity: Not on file  Lifestyle  . Physical activity:    Days per week: Not on file    Minutes per session: Not on file  . Stress: Not on file  Relationships  . Social connections:    Talks on phone: Not on file    Gets together: Not on  file    Attends religious service: Not on file    Active member of club or organization: Not on file    Attends meetings of clubs or organizations: Not on file    Relationship status: Not on file  . Intimate partner violence:    Fear of current or ex partner: Not on file    Emotionally abused: Not on file    Physically abused: Not on file    Forced sexual activity: Not on file  Other Topics Concern  . Not on file  Social History Narrative   Household-- pt and wife   Has an adult son      Allergies as of 01/21/2019   No Known Allergies     Medication List       Accurate as of January 21, 2019  8:19 AM. Always use your most recent med list.        amLODipine 5 MG tablet Commonly known as:  NORVASC Take 1 tablet (5 mg total) by mouth daily.   aspirin 81 MG tablet Take 81 mg by mouth daily.   atorvastatin 20 MG tablet Commonly known as:  LIPITOR TAKE 1 TABLET BY MOUTH  DAILY   carvedilol 12.5 MG tablet Commonly known as:  COREG Take 1 tablet (12.5 mg total) by mouth 2 (two) times daily with a meal.   levothyroxine 88 MCG tablet Commonly known as:  SYNTHROID, LEVOTHROID Take 1 tablet (88 mcg total) by mouth daily before breakfast.   lisinopril 40 MG tablet Commonly known as:  PRINIVIL,ZESTRIL Take 1 tablet (40 mg total) by mouth daily.   Nasal Decongestant Spray 0.05 % nasal spray Generic drug:  oxymetazoline Place 2 sprays into both nostrils 2 (two) times daily as needed for congestion. As directed   nitroGLYCERIN 0.4 MG SL tablet Commonly known as:  NITROSTAT Place 1 tablet (0.4 mg total) under the tongue every 5 (five) minutes as needed for chest pain.   sertraline 100 MG tablet Commonly known as:  ZOLOFT Take 1 tablet (100 mg total) by mouth daily.   sodium chloride 0.65 % Soln nasal spray Commonly known as:  OCEAN Place 1 spray into both nostrils as needed for congestion.   Zytiga 250 MG tablet Generic drug:  abiraterone acetate TAKE 4 TABLETS BY  MOUTH  ONCE DAILY ON AN EMPTY  STOMACH 1 HOUR BEFORE OR 2  HOURS AFTER A MEAL           Objective:   Physical Exam There were no vitals taken for this visit. This is a virtual video visit, alert oriented x3, in no apparent distress    Assessment     Assessment  Pre-diabetes HTN Hyperlipidemia Anxiety CAD s/p stents 2014  EtOH  dependency Ulcerative colitis- last cscope 05-2015 GU: -Metastatic prostate cancer, DX 11-2015, radical prostatectomy 11-2015 --  continent as off  07-2016--on androgen depravation  -Hematuria, saw urology 01-2013 reviewed: CT show a renal cyst, cystoscopy negative, they recommended follow-up MRI for the renal cyst. MS  Dx 1991 (B neuritis, rx, resolved, felt to be d/t MS) Bilateral thenar atrophy noted 06-2017 (apparently chronic) Nose bleed: R nostril artery cauterize 08-2017 TGA: DX 09/2018, MRI brain, EEG negative saw neurology in follow-up, carotid ultrasound ordered: 1 to 39% bilaterally.  PLAN Dermatitis: Patient plans to work in the yard he has been previously exposed to mites, request Elimite prescription, it was sent Anxiety: Doing well emotionally. Social: Currently with no insurance, has enough medicines to last for at least 2 months, he knows to call for RF when needed. RTC will be August 2020 as a schedule     I discussed the assessment and treatment plan with the patient. The patient was provided an opportunity to ask questions and all were answered. The patient agreed with the plan and demonstrated an understanding of the instructions.   The patient was advised to call back or seek an in-person evaluation if the symptoms worsen or if the condition fails to improve as anticipated.

## 2019-01-22 NOTE — Assessment & Plan Note (Signed)
Dermatitis: Patient plans to work in the yard he has been previously exposed to mites, request Elimite prescription, it was sent Anxiety: Doing well emotionally. Social: Currently with no insurance, has enough medicines to last for at least 2 months, he knows to call for RF when needed. RTC will be August 2020 as a schedule

## 2019-01-29 ENCOUNTER — Other Ambulatory Visit: Payer: Self-pay | Admitting: Oncology

## 2019-01-29 DIAGNOSIS — C61 Malignant neoplasm of prostate: Secondary | ICD-10-CM

## 2019-01-30 ENCOUNTER — Other Ambulatory Visit: Payer: Self-pay | Admitting: Internal Medicine

## 2019-02-06 ENCOUNTER — Other Ambulatory Visit: Payer: Self-pay | Admitting: Internal Medicine

## 2019-03-02 ENCOUNTER — Other Ambulatory Visit: Payer: Self-pay | Admitting: Oncology

## 2019-03-02 DIAGNOSIS — C61 Malignant neoplasm of prostate: Secondary | ICD-10-CM

## 2019-03-15 ENCOUNTER — Other Ambulatory Visit: Payer: Self-pay | Admitting: Oncology

## 2019-03-15 DIAGNOSIS — C61 Malignant neoplasm of prostate: Secondary | ICD-10-CM

## 2019-03-29 ENCOUNTER — Encounter: Payer: Self-pay | Admitting: Internal Medicine

## 2019-04-06 ENCOUNTER — Telehealth: Payer: Self-pay

## 2019-04-06 NOTE — Telephone Encounter (Signed)
Spoke with pt by phone.  He states he has not had his zytiga in a week d/t waiting for cobra coverage to start.   In basket msg sent to [facility] oncology pharmacy to inquire about getting pt the medication until [place] can dispense once his cobra starts.

## 2019-04-06 NOTE — Telephone Encounter (Signed)
Oral Oncology Patient Advocate Encounter  I spoke to the patient about his insurance change. He is waiting on COBRA and it should be active today. I cannot find active coverage right now but I will continue to check.  I did explain manufacturer assistance to him but since he will have COBRA soon we will hold off on that.  When COBRA is active I will be able to get him a copay card to make his out of pocket cost $0.  The patient verbalized understanding and great appreciation.  [place] Patient [place] Phone [phone] Fax [phone] 04/06/2019   3:04 PM

## 2019-04-13 NOTE — Telephone Encounter (Signed)
Oral Oncology Patient Advocate Encounter  COBRA is now active. [name] is still required to be filled at [facility].   The patient will call [name] to order his refill.  [place] Patient [place] Phone [phone] Fax [phone] 04/13/2019   8:39 AM

## 2019-04-26 ENCOUNTER — Other Ambulatory Visit: Payer: Self-pay | Admitting: Oncology

## 2019-04-26 DIAGNOSIS — C61 Malignant neoplasm of prostate: Secondary | ICD-10-CM

## 2019-05-11 ENCOUNTER — Encounter: Payer: Self-pay | Admitting: Internal Medicine

## 2019-05-11 ENCOUNTER — Ambulatory Visit: Payer: Commercial Managed Care - PPO | Admitting: Internal Medicine

## 2019-05-11 ENCOUNTER — Other Ambulatory Visit: Payer: Self-pay

## 2019-05-11 VITALS — BP 157/81 | HR 54 | Temp 98.0°F | Resp 16 | Ht 72.0 in | Wt 237.4 lb

## 2019-05-11 DIAGNOSIS — E039 Hypothyroidism, unspecified: Secondary | ICD-10-CM | POA: Diagnosis not present

## 2019-05-11 DIAGNOSIS — I10 Essential (primary) hypertension: Secondary | ICD-10-CM | POA: Diagnosis not present

## 2019-05-11 DIAGNOSIS — L989 Disorder of the skin and subcutaneous tissue, unspecified: Secondary | ICD-10-CM | POA: Diagnosis not present

## 2019-05-11 DIAGNOSIS — R202 Paresthesia of skin: Secondary | ICD-10-CM

## 2019-05-11 DIAGNOSIS — I2510 Atherosclerotic heart disease of native coronary artery without angina pectoris: Secondary | ICD-10-CM

## 2019-05-11 LAB — BASIC METABOLIC PANEL
BUN: 8 mg/dL (ref 6–23)
CO2: 29 mEq/L (ref 19–32)
Calcium: 9.4 mg/dL (ref 8.4–10.5)
Chloride: 104 mEq/L (ref 96–112)
Creatinine, Ser: 0.6 mg/dL (ref 0.40–1.50)
GFR: 165.95 mL/min (ref >60.00)
Glucose, Bld: 112 mg/dL — ABNORMAL HIGH (ref 70–99)
Potassium: 3.2 mEq/L — ABNORMAL LOW (ref 3.5–5.1)
Sodium: 141 mEq/L (ref 135–145)

## 2019-05-11 LAB — TSH: TSH: 3.02 u[IU]/mL (ref 0.35–4.50)

## 2019-05-11 MED ORDER — AMLODIPINE BESYLATE 10 MG PO TABS: 10.0000 mg | ORAL_TABLET | Freq: Every day | ORAL | 1 refills | Status: DC

## 2019-05-11 NOTE — Assessment & Plan Note (Addendum)
  HTN:  Systolic BP increase here and consistently at home, increase amlodipine to 10 mg, continue carvedilol, lisinopril.  Check a BMP, monitor BPs. Anxiety: Continue to be unemployed, trying to stay busy, PHQ 9: 10. Continue Zoloft. CAD: Asymptomatic. Heart murmur: Soft heart murmur felt today on exam today.  Reassess on RTC.  Echo?. EtOH: Drinking in moderation per patient. Hypothyroidism: On Synthroid, check TSH Skin lesions: See physical exam, R lesion:  suspect a sebaceous cyst.  L lesion: the area was previously red and irritated, likely postinflammatory hyperpigmentation.  Recommend observation. Ulcerative colitis: Doing well at the present time, reports he will have a colonoscopy 06-2019 Paresthesias/gait:  Patient reports paresthesias and difficulties with gait.  This was previously discussed with neurology 09/2018, he has a FH   of hereditary sensorimotor neuropathy (Charcot-Marie-Tooth).  Neurology recommended NCV and EMG, if nondiagnostic proceed with MRI of the spinal cord.  Patient did not proceed with further testing.  Symptoms are at baseline Preventive care: Recommend flu shot early this season RTC November 2020, CPX

## 2019-05-11 NOTE — Patient Instructions (Addendum)
GO TO THE LAB : Get the blood work     GO TO THE FRONT DESK Schedule your next appointment for a physical in 3-4 months  Get a flu shot early in the fall  Increase amlodipine to 10 mg a day  Check the  blood pressure 2  times week BP GOAL is between 110/65 and  135/85. If it is consistently higher or lower, let me know

## 2019-05-11 NOTE — Progress Notes (Signed)
Subjective:    Patient ID: Gary [name], male    DOB: 06-01-1956, 63 y.o.   MRN: [id number]  DOS:  05/11/2019 Type of visit - description: Routine office visit HTN: Systolic blood pressure has been consistently elevated, typically in the 150s Prostate cancer: Has some difficulties getting Zytiga.  Had a Lupron shot. Depression: Continue jobless, he is trying to stay busy and looking for a job.  Does not feel terribly sad or depressed but he sleeps a lot. Has a couple of lesions at the abdomen, noted about a month ago.  Review of Systems No fever chills No chest pain no difficulty breathing No nausea or vomiting No cough   Past Medical History:  Diagnosis Date  . Anxiety   . CAD (coronary artery disease) 10-2012   h/o stents   . Elevated PSA    Prostate Bx w/ Dr. Tresa [name]  . EtOH dependence ([place])   . Family history of adverse reaction to anesthesia    made father disoriented  . Gross hematuria   . Hepatic steatosis   . Hyperlipidemia LDL goal < 70   . Hypertension   . Metastatic adenocarcinoma to prostate ([place]) 2017   Gleason score 9, metastasis to R external Iliac node and large extraprostatic extension at prostatectomy  . Multiple sclerosis ([place]) 1991   Bilateral optic neuritis, treated and resolved but felt to be a manifestation of MS.  . Renal cyst, left    Bosniak 2K (indeterminant) hyperdense cyst CT 2014, Stable CT in 2016  . Ulcerative colitis [facility])     Past Surgical History:  Procedure Laterality Date  . FOOT SURGERY     R toe Fx (screws),  L dorsal FX (screws)  . LEFT HEART CATHETERIZATION WITH CORONARY ANGIOGRAM N/A 10/22/2012   Procedure: LEFT HEART CATHETERIZATION WITH CORONARY ANGIOGRAM;  Surgeon: Sinclair Grooms, MD;  Location: [facility] CATH LAB;  Service: Cardiovascular;  Laterality: N/A;  . LYMPHADENECTOMY Bilateral 11/24/2015   Procedure: BILATERAL PELVIC LYMPHADENECTOMY;  Surgeon: Alexis Frock, MD;  Location: [facility] ORS;  Service: Urology;  Laterality:  Bilateral;  . PERCUTANEOUS CORONARY STENT INTERVENTION (PCI-S) N/A 10/23/2012   Procedure: PERCUTANEOUS CORONARY STENT INTERVENTION (PCI-S);  Surgeon: Sinclair Grooms, MD;  Location: [facility] CATH LAB;  Service: Cardiovascular;  Laterality: N/A;  . PROSTATE BIOPSY  09/2015   Dr. Tresa [name]  . ROBOT ASSISTED LAPAROSCOPIC RADICAL PROSTATECTOMY N/A 11/24/2015   Procedure: XI ROBOTIC ASSISTED LAPAROSCOPIC RADICAL PROSTATECTOMY WITH INDOCYANINE GREEN DYE;  Surgeon: Alexis Frock, MD;  Location: [facility] ORS;  Service: Urology;  Laterality: N/A;  . VASECTOMY      Social History   Socioeconomic History  . Marital status: Married    Spouse name: Not on file  . Number of children: 1  . Years of education: Not on file  . Highest education level: Not on file  Occupational History  . Occupation: Actuary: [employer]  Social Needs  . Financial resource strain: Not on file  . Food insecurity    Worry: Not on file    Inability: Not on file  . Transportation needs    Medical: Not on file    Non-medical: Not on file  Tobacco Use  . Smoking status: Former Smoker    Quit date: 07/18/1990    Years since quitting: 28.8  . Smokeless tobacco: Former Systems developer  . Tobacco comment: 1991  Substance and Sexual Activity  . Alcohol use: Yes    Comment: weekly  .  Drug use: Yes    Types: Marijuana  . Sexual activity: Not on file  Lifestyle  . Physical activity    Days per week: Not on file    Minutes per session: Not on file  . Stress: Not on file  Relationships  . Social Herbalist on phone: Not on file    Gets together: Not on file    Attends religious service: Not on file    Active member of club or organization: Not on file    Attends meetings of clubs or organizations: Not on file    Relationship status: Not on file  . Intimate partner violence    Fear of current or ex partner: Not on file    Emotionally abused: Not on file    Physically abused: Not on file    Forced sexual activity:  Not on file  Other Topics Concern  . Not on file  Social History Narrative   Household-- pt and wife   Has an adult son      Allergies as of 05/11/2019   No Known Allergies     Medication List       Accurate as of May 11, 2019  5:14 PM. If you have any questions, ask your nurse or doctor.        amLODipine 10 MG tablet Commonly known as: NORVASC Take 1 tablet (10 mg total) by mouth daily. What changed:   medication strength  how much to take Changed by: [name], MD   aspirin 81 MG tablet Take 81 mg by mouth daily.   atorvastatin 20 MG tablet Commonly known as: LIPITOR TAKE 1 TABLET BY MOUTH  DAILY   carvedilol 12.5 MG tablet Commonly known as: COREG Take 1 tablet (12.5 mg total) by mouth 2 (two) times daily with a meal.   levothyroxine 88 MCG tablet Commonly known as: SYNTHROID Take 1 tablet (88 mcg total) by mouth daily before breakfast.   lisinopril 40 MG tablet Commonly known as: ZESTRIL Take 1 tablet (40 mg total) by mouth daily.   mesalamine 1000 MG suppository Commonly known as: CANASA   Nasal Decongestant Spray 0.05 % nasal spray Generic drug: oxymetazoline Place 2 sprays into both nostrils 2 (two) times daily as needed for congestion. As directed   nitroGLYCERIN 0.4 MG SL tablet Commonly known as: NITROSTAT Place 1 tablet (0.4 mg total) under the tongue every 5 (five) minutes as needed for chest pain.   sertraline 100 MG tablet Commonly known as: ZOLOFT Take 1 tablet (100 mg total) by mouth daily.   sodium chloride 0.65 % Soln nasal spray Commonly known as: OCEAN Place 1 spray into both nostrils as needed for congestion.   Zytiga 250 MG tablet Generic drug: abiraterone acetate TAKE 4 TABLETS (1,000MG) BY MOUTH ONCE DAILY ON AN  EMPTY STOMACH 1 HOUR BEFORE OR 2 HOURS AFTER A MEAL           Objective:   Physical Exam Abdominal:      BP (!) 157/81 (BP Location: Left Arm, Patient Position: Sitting, Cuff Size: Small)   Pulse (!)  54   Temp 98 F (36.7 C) (Oral)   Resp 16   Ht 6' (1.829 m)   Wt 237 lb 6 oz (107.7 kg)   SpO2 97%   BMI 32.19 kg/m  General:   Well developed, NAD, BMI noted. HEENT:  Normocephalic . Face symmetric, atraumatic Lungs:  CTA B Normal respiratory effort, no intercostal retractions, no  accessory muscle use. Heart: RRR, soft systolic murmur more noticeable left from the sternum  .  No pretibial edema bilaterally  Skin: Not pale. Not jaundice Neurologic:  alert & oriented X3.  Speech normal, gait appropriate for age and unassisted Psych--  Cognition and judgment appear intact.  Cooperative with normal attention span and concentration.  Behavior appropriate. No anxious or depressed appearing.      Assessment    Assessment  Pre-diabetes HTN Hyperlipidemia Anxiety CAD s/p stents 2014  EtOH dependency Ulcerative colitis- last cscope 05-2015 GU: -Metastatic prostate cancer, DX 11-2015, radical prostatectomy 11-2015 --  continent as off  07-2016--on androgen depravation  -Hematuria, saw urology 01-2013 reviewed: CT show a renal cyst, cystoscopy negative, they recommended follow-up MRI for the renal cyst. MS  Dx 1991 (B neuritis, rx, resolved, felt to be d/t MS) Bilateral thenar atrophy noted 06-2017 (apparently chronic) Nose bleed: R nostril artery cauterize 08-2017 TGA: DX 09/2018, MRI brain, EEG negative saw neurology in follow-up, carotid ultrasound ordered: 1 to 39% bilaterally.  PLAN HTN:  Systolic BP increase here and consistently at home, increase amlodipine to 10 mg, continue carvedilol, lisinopril.  Check a BMP, monitor BPs. Anxiety: Continue to be unemployed, trying to stay busy, PHQ 9: 10. Continue Zoloft. CAD: Asymptomatic. Heart murmur: Soft heart murmur felt today on exam today.  Reassess on RTC.  Echo?. EtOH: Drinking in moderation per patient. Hypothyroidism: On Synthroid, check TSH Skin lesions: See physical exam, R lesion:  suspect a sebaceous cyst.  L lesion:  the area was previously red and irritated, likely postinflammatory hyperpigmentation.  Recommend observation. Ulcerative colitis: Doing well at the present time, reports he will have a colonoscopy 06-2019 Paresthesias/gait:  Patient reports paresthesias and difficulties with gait.  This was previously discussed with neurology 09/2018, he has a FH   of hereditary sensorimotor neuropathy (Charcot-Marie-Tooth).  Neurology recommended NCV and EMG, if nondiagnostic proceed with MRI of the spinal cord.  Patient did not proceed with further testing.  Symptoms are at baseline Preventive care: Recommend flu shot early this season RTC November 2020, CPX

## 2019-05-11 NOTE — Progress Notes (Signed)
Pre visit review using our clinic review tool, if applicable. No additional management support is needed unless otherwise documented below in the visit note. 

## 2019-05-17 ENCOUNTER — Other Ambulatory Visit: Payer: Self-pay | Admitting: Oncology

## 2019-05-17 DIAGNOSIS — C61 Malignant neoplasm of prostate: Secondary | ICD-10-CM

## 2019-05-29 ENCOUNTER — Other Ambulatory Visit: Payer: Self-pay | Admitting: Oncology

## 2019-05-29 DIAGNOSIS — C61 Malignant neoplasm of prostate: Secondary | ICD-10-CM

## 2019-06-15 ENCOUNTER — Other Ambulatory Visit: Payer: Self-pay

## 2019-06-15 DIAGNOSIS — C61 Malignant neoplasm of prostate: Secondary | ICD-10-CM

## 2019-06-15 MED ORDER — ABIRATERONE ACETATE 250 MG PO TABS: ORAL_TABLET | ORAL | 0 refills | Status: DC

## 2019-06-16 ENCOUNTER — Telehealth: Payer: Self-pay | Admitting: Pharmacist

## 2019-06-16 DIAGNOSIS — C61 Malignant neoplasm of prostate: Secondary | ICD-10-CM

## 2019-06-16 MED ORDER — ABIRATERONE ACETATE 250 MG PO TABS: ORAL_TABLET | ORAL | 1 refills | Status: DC

## 2019-06-16 NOTE — Telephone Encounter (Signed)
Oral Oncology Pharmacist Encounter  Received notification from [facility] pharmacist, Kennith Center, that she had received a voicemail from patient on her personal cell phone. Voicemail stated that patient is almost out of his Zytiga and needs assistance. I returned call to patient and patient stated that he had checked the online portal for dispensing pharmacy, [facility], and Zytiga prescription stated that information was required from the doctor's office.  Per patient's medication list new abiraterone prescription was sent to dispensing pharmacy on 06/15/2019.  Directions were to take 4 tablets (1000 mg) by mouth once daily, quantity #60, refills = 0. This is only a 15-day supply. This is likely why the pharmacy was requesting additional information from the doctor's office.  New prescription for abiraterone 250 mg tablets, take 4 tablets (1000 mg) by mouth once daily on an empty stomach, 1 hour before or 2 hours after meals, quantity #120, refills = 1, has been E scribed to the pharmacy.  Patient informed that he had called the personal cell phone of 1 of my colleagues and was provided direct dial to oral oncology clinic for any future needs.  Patient instructed to reach out to dispensing pharmacy in a couple of hours to see if they have processed new prescription and were able to get his next fill of abiraterone sent to him.  All questions answered. Patient knows to call the office with any additional questions or concerns.  Johny Drilling, PharmD, BCPS, BCOP  06/16/2019 3:10 PM Oral Oncology Clinic [phone]

## 2019-07-06 ENCOUNTER — Encounter: Payer: Self-pay | Admitting: Internal Medicine

## 2019-07-06 LAB — HM COLONOSCOPY

## 2019-07-13 ENCOUNTER — Other Ambulatory Visit: Payer: Self-pay | Admitting: Oncology

## 2019-07-13 ENCOUNTER — Encounter: Payer: Self-pay | Admitting: Internal Medicine

## 2019-07-13 ENCOUNTER — Other Ambulatory Visit: Payer: Self-pay | Admitting: Internal Medicine

## 2019-07-13 DIAGNOSIS — C61 Malignant neoplasm of prostate: Secondary | ICD-10-CM

## 2019-07-14 ENCOUNTER — Other Ambulatory Visit: Payer: Self-pay | Admitting: Internal Medicine

## 2019-07-14 MED ORDER — CARVEDILOL 25 MG PO TABS: 25.0000 mg | ORAL_TABLET | Freq: Two times a day (BID) | ORAL | 0 refills | Status: DC

## 2019-07-14 NOTE — Telephone Encounter (Signed)
Colon Branch, MD  Damita Dunnings, CMA        Send carvedilol 25 mg 1 p.o. twice daily #60, no refills Arrange a nurse visit 2 weeks from today Needs BP and heart rate check

## 2019-07-16 ENCOUNTER — Other Ambulatory Visit: Payer: Self-pay

## 2019-07-16 ENCOUNTER — Ambulatory Visit (INDEPENDENT_AMBULATORY_CARE_PROVIDER_SITE_OTHER): Payer: Self-pay

## 2019-07-16 DIAGNOSIS — Z23 Encounter for immunization: Secondary | ICD-10-CM

## 2019-07-19 ENCOUNTER — Other Ambulatory Visit: Payer: Self-pay | Admitting: Internal Medicine

## 2019-07-19 ENCOUNTER — Ambulatory Visit (INDEPENDENT_AMBULATORY_CARE_PROVIDER_SITE_OTHER): Payer: Commercial Managed Care - PPO | Admitting: Family Medicine

## 2019-07-19 ENCOUNTER — Other Ambulatory Visit: Payer: Self-pay

## 2019-07-19 ENCOUNTER — Encounter: Payer: Self-pay | Admitting: Family Medicine

## 2019-07-19 VITALS — BP 131/78 | HR 47 | Temp 97.8°F | Ht 72.0 in | Wt 235.0 lb

## 2019-07-19 DIAGNOSIS — G454 Transient global amnesia: Secondary | ICD-10-CM

## 2019-07-19 DIAGNOSIS — R296 Repeated falls: Secondary | ICD-10-CM

## 2019-07-19 DIAGNOSIS — F1020 Alcohol dependence, uncomplicated: Secondary | ICD-10-CM

## 2019-07-19 DIAGNOSIS — R200 Anesthesia of skin: Secondary | ICD-10-CM

## 2019-07-19 NOTE — Progress Notes (Signed)
I have read the note, and I agree with the clinical assessment and plan.  Conny Moening A. Tuesday Terlecki, MD, PhD, FAAN Certified in Neurology, Clinical Neurophysiology, Sleep Medicine, Pain Medicine and Neuroimaging  [facility] [address], [address] [place],  [address] [phone]  

## 2019-07-19 NOTE — Progress Notes (Signed)
PATIENT: Gary [name] DOB: 1955-12-31  REASON FOR VISIT: follow up HISTORY FROM: patient  Chief Complaint  Patient presents with  . Follow-up    Treatment room, with wife. Transient global Amnesia f/u "Feet are heavy, balance is off. RLS at night."      HISTORY OF PRESENT ILLNESS: Today 07/19/19 Gary [name] is a 63 y.o. male here today for follow up for transient global amnesia.  He has not had any further concerns or events similar to that of 09/15/2018 He continues to have a heaviness in his feel bilaterally and feels that his balance is off. MRI in 09/2018 normal. History of neuropathies and RLS. He reports dropping a 100lb weight on his right great toe about a year ago but did not feel it.  He reports multiple falls over the past year.  No injuries.  He does not use assistive devices when walking.  He was advised to get a NCS/EMG by Dr Felecia Shelling in 09/2018 but he has not returned. He is followed closely by PCP.  He has a history of alcohol abuse but reports drinking in moderation recently.  He has not been followed by psychiatry.  He does continue Zoloft 100 mg daily for anxiety and depression.  HISTORY: (copied from dr Garth Bigness note on 09/22/2018)  Had the pleasure of seeing your patient, Palmer Fahrner, at [facility] for neurologic consultation regarding his recent episode of amnesia, history of optic neuritis and numbness.  He had an episode of transient global amnesia.  He was told on 09/14/2018 that he would be laid off at the end of the year.  He reported to work the next morning, 09/15/2018 and had a normal morning.  He recalls leaving a meeting around 1 pm that afternoon.   He went to the TV station after the meeting around 2 pm and did not feel right so he called [facility] near [place] and he remembers and was told that they took walk-ins.   He went to their waiting room and he remembers being there but he did not feel quite right. He  was seen around 3-330 pm and does not remember anything from that interaction. His BP was very elevated and he was sent to [facility].   He has no memory of the next 6-7 hours.   He was found to have a markedly elevated BP.   He was admitted.   He has no recall of about 6 hours for around 4 pm to around 10 pm.  His son saw him in the ED and he showed him a picture of some family and he knew only one name.  He doesn't recall this.   He then has recall of seeing his wife and sister in the hospital room and memory was fine after that.   He felt foggy at 10 pm and his family left around an hour later.   He recalls sleeping all night and much of the next day.   He was a little sleepy the next couple days.   He recalls having the MRI but was groggy (that was around 9 pm).    He had an EEG 09/16/2018 that was normal.    He was discharged 09/16/2018 and has been a little more tired and feels focus is a little worse but he is near baseline.      He has HTN and hyperlipidemia.  He is on Zytiga for metastatic prostate cancer.    He  has depression and anxiety.   He has noted numbness in his feet x 5-6 years and has high arches.   He is noting more difficulty with gait and often needs to look down.   He holds on to the wall when he shampoos his hair.   His mother and brother have Charcot-Marie-Tooth.    He followed up with Behavioral Health and was seen for follow-up.  He was increased to sertraline 100 mg.   Lab work on 09/15/2018 and 09/16/2018 showed low potassium.  CBC and other lab work was normal.  I personally reviewed the MRI of the brain performed 09/16/2018.  It is normal.  The report of the CT scan performed 09/15/2018 showed possible skull lesions.  His discharge summary states that he is to follow-up with urology for evaluation of the CT scan results.  He had optic neuritis starting on the left and then spreading to the right 20 years ago.   Vision was poor x months.   He was having headaches throughout.     He had an MRI and was told it was fine.    He had IV Solu-medrol for a few days and felt he improved afterwards.    REVIEW OF SYSTEMS: Out of a complete 14 system review of symptoms, the patient complains only of the following symptoms, weakness, numbness, restless legs and all other reviewed systems are negative.  ALLERGIES: No Known Allergies  HOME MEDICATIONS: Outpatient Medications Prior to Visit  Medication Sig Dispense Refill  . amLODipine (NORVASC) 10 MG tablet Take 1 tablet (10 mg total) by mouth daily. 90 tablet 1  . aspirin 81 MG tablet Take 81 mg by mouth daily.    Marland Kitchen atorvastatin (LIPITOR) 20 MG tablet TAKE 1 TABLET BY MOUTH  DAILY 90 tablet 1  . carvedilol (COREG) 25 MG tablet Take 1 tablet (25 mg total) by mouth 2 (two) times daily with a meal. 60 tablet 0  . levothyroxine (SYNTHROID) 88 MCG tablet Take 1 tablet (88 mcg total) by mouth daily before breakfast. 90 tablet 1  . lisinopril (PRINIVIL,ZESTRIL) 40 MG tablet Take 1 tablet (40 mg total) by mouth daily. 90 tablet 1  . mesalamine (CANASA) 1000 MG suppository     . nitroGLYCERIN (NITROSTAT) 0.4 MG SL tablet Place 1 tablet (0.4 mg total) under the tongue every 5 (five) minutes as needed for chest pain. 25 tablet 3  . oxymetazoline (NASAL DECONGESTANT SPRAY) 0.05 % nasal spray Place 2 sprays into both nostrils 2 (two) times daily as needed for congestion. As directed    . sertraline (ZOLOFT) 100 MG tablet Take 1 tablet (100 mg total) by mouth daily. 90 tablet 1  . sodium chloride (OCEAN) 0.65 % SOLN nasal spray Place 1 spray into both nostrils as needed for congestion.    Marland Kitchen ZYTIGA 250 MG tablet TAKE 4 TABLETS (1,000MG) BY MOUTH ONCE DAILY ON AN  EMPTY STOMACH 1 HOUR BEFORE OR 2 HOURS AFTER A MEAL 120 tablet 1   No facility-administered medications prior to visit.     PAST MEDICAL HISTORY: Past Medical History:  Diagnosis Date  . Anxiety   . CAD (coronary artery disease) 10-2012   h/o stents   . Elevated PSA    Prostate  Bx w/ Dr. Tresa [name]  . EtOH dependence ([place])   . Family history of adverse reaction to anesthesia    made father disoriented  . Gross hematuria   . Hepatic steatosis   . Hyperlipidemia LDL goal < 70   .  Hypertension   . Metastatic adenocarcinoma to prostate ([place]) 2017   Gleason score 9, metastasis to R external Iliac node and large extraprostatic extension at prostatectomy  . Multiple sclerosis ([place]) 1991   Bilateral optic neuritis, treated and resolved but felt to be a manifestation of MS.  . Renal cyst, left    Bosniak 2K (indeterminant) hyperdense cyst CT 2014, Stable CT in 2016  . Ulcerative colitis ([place])     PAST SURGICAL HISTORY: Past Surgical History:  Procedure Laterality Date  . FOOT SURGERY     R toe Fx (screws),  L dorsal FX (screws)  . LEFT HEART CATHETERIZATION WITH CORONARY ANGIOGRAM N/A 10/22/2012   Procedure: LEFT HEART CATHETERIZATION WITH CORONARY ANGIOGRAM;  Surgeon: Sinclair Grooms, MD;  Location: [facility] CATH LAB;  Service: Cardiovascular;  Laterality: N/A;  . LYMPHADENECTOMY Bilateral 11/24/2015   Procedure: BILATERAL PELVIC LYMPHADENECTOMY;  Surgeon: Alexis Frock, MD;  Location: [facility] ORS;  Service: Urology;  Laterality: Bilateral;  . PERCUTANEOUS CORONARY STENT INTERVENTION (PCI-S) N/A 10/23/2012   Procedure: PERCUTANEOUS CORONARY STENT INTERVENTION (PCI-S);  Surgeon: Sinclair Grooms, MD;  Location: [facility] CATH LAB;  Service: Cardiovascular;  Laterality: N/A;  . PROSTATE BIOPSY  09/2015   Dr. Tresa [name]  . ROBOT ASSISTED LAPAROSCOPIC RADICAL PROSTATECTOMY N/A 11/24/2015   Procedure: XI ROBOTIC ASSISTED LAPAROSCOPIC RADICAL PROSTATECTOMY WITH INDOCYANINE GREEN DYE;  Surgeon: Alexis Frock, MD;  Location: [facility] ORS;  Service: Urology;  Laterality: N/A;  . VASECTOMY      FAMILY HISTORY: Family History  Problem Relation Age of Onset  . Dementia Mother   . Hypertension Mother   . Coronary artery disease Father   . Diabetes Mellitus II Father   . Lupus Sister   . Coronary  artery disease Brother   . Diabetes Brother   . Hypertension Brother   . Prostate cancer Brother   . Thyroid cancer Sister   . Heart failure Sister   . Colon cancer Neg Hx     SOCIAL HISTORY: Social History   Socioeconomic History  . Marital status: Married    Spouse name: Not on file  . Number of children: 1  . Years of education: Not on file  . Highest education level: Not on file  Occupational History  . Occupation: Actuary: [employer]  Social Needs  . Financial resource strain: Not on file  . Food insecurity    Worry: Not on file    Inability: Not on file  . Transportation needs    Medical: Not on file    Non-medical: Not on file  Tobacco Use  . Smoking status: Former Smoker    Quit date: 07/18/1990    Years since quitting: 29.0  . Smokeless tobacco: Former Systems developer  . Tobacco comment: 1991  Substance and Sexual Activity  . Alcohol use: Yes    Comment: weekly  . Drug use: Yes    Types: Marijuana  . Sexual activity: Not on file  Lifestyle  . Physical activity    Days per week: Not on file    Minutes per session: Not on file  . Stress: Not on file  Relationships  . Social Herbalist on phone: Not on file    Gets together: Not on file    Attends religious service: Not on file    Active member of club or organization: Not on file    Attends meetings of clubs or organizations: Not on file  Relationship status: Not on file  . Intimate partner violence    Fear of current or ex partner: Not on file    Emotionally abused: Not on file    Physically abused: Not on file    Forced sexual activity: Not on file  Other Topics Concern  . Not on file  Social History Narrative   Household-- pt and wife   Has an adult son      PHYSICAL EXAM  Vitals:   07/19/19 1010  BP: 131/78  Pulse: (!) 47  Temp: 97.8 F (36.6 C)  Weight: 235 lb (106.6 kg)  Height: 6' (1.829 m)   Body mass index is 31.87 kg/m.  Generalized: Well developed, in no  acute distress  Cardiology: normal rate and rhythm, no murmur noted Neurological examination  Mentation: Alert oriented to time, place, history taking. Follows all commands speech and language fluent Cranial nerve II-XII: Pupils were equal round reactive to light. Extraocular movements were full, visual field were full on confrontational test. Facial sensation and strength were normal. Uvula tongue midline. Head turning and shoulder shrug  were normal and symmetric. Motor: The motor testing reveals 5 over 5 strength of all 4 extremities. Good symmetric motor tone is noted throughout.  Sensory: Sensory testing is intact to soft touch on all 4 extremities. No evidence of extinction is noted. Pinprick normal with exception of bilateral feet, patient reports no feeling of either foot, sensation returns at ankle.  Coordination: Cerebellar testing reveals good finger-nose-finger and heel-to-shin bilaterally.  Gait and station: Gait is normal. Tandem gait is normal. Romberg is negative. No drift is seen.  Reflexes: Deep tendon reflexes are symmetric and normal bilaterally.   DIAGNOSTIC DATA (LABS, IMAGING, TESTING) - I reviewed patient records, labs, notes, testing and imaging myself where available.  No flowsheet data found.   Lab Results  Component Value Date   WBC 5.6 09/18/2018   HGB 15.2 09/18/2018   HCT 45.4 09/18/2018   MCV 83.3 09/18/2018   PLT 216.0 09/18/2018      Component Value Date/Time   NA 141 05/11/2019 0851   NA 140 08/27/2017 1259   K 3.2 (L) 05/11/2019 0851   K 3.8 08/27/2017 1259   CL 104 05/11/2019 0851   CO2 29 05/11/2019 0851   CO2 26 08/27/2017 1259   GLUCOSE 112 (H) 05/11/2019 0851   GLUCOSE 102 08/27/2017 1259   BUN 8 05/11/2019 0851   BUN 10.5 08/27/2017 1259   CREATININE 0.60 05/11/2019 0851   CREATININE 0.78 03/05/2018 0753   CREATININE 0.8 08/27/2017 1259   CALCIUM 9.4 05/11/2019 0851   CALCIUM 9.4 08/27/2017 1259   PROT 7.8 09/18/2018 1019   PROT 8.0  08/27/2017 1259   ALBUMIN 4.6 09/18/2018 1019   ALBUMIN 4.0 08/27/2017 1259   AST 22 09/18/2018 1019   AST 41 (H) 03/05/2018 0753   AST 16 08/27/2017 1259   ALT 37 09/18/2018 1019   ALT 58 (H) 03/05/2018 0753   ALT 23 08/27/2017 1259   ALKPHOS 101 09/18/2018 1019   ALKPHOS 91 08/27/2017 1259   BILITOT 0.8 09/18/2018 1019   BILITOT 0.6 03/05/2018 0753   BILITOT 0.35 08/27/2017 1259   GFRNONAA >60 09/16/2018 0552   GFRNONAA >60 03/05/2018 0753   GFRAA >60 09/16/2018 0552   GFRAA >60 03/05/2018 0753   Lab Results  Component Value Date   CHOL 149 12/15/2018   HDL 44.80 12/15/2018   LDLCALC 74 12/15/2018   LDLDIRECT 93.0 08/19/2018   TRIG  154.0 (H) 12/15/2018   CHOLHDL 3 12/15/2018   Lab Results  Component Value Date   HGBA1C 5.6 08/19/2018   No results found for: VITAMINB12 Lab Results  Component Value Date   TSH 3.02 05/11/2019       ASSESSMENT AND PLAN 63 y.o. year old male  has a past medical history of Anxiety, CAD (coronary artery disease) (10-2012), Elevated PSA, EtOH dependence ([place]), Family history of adverse reaction to anesthesia, Gross hematuria, Hepatic steatosis, Hyperlipidemia LDL goal < 70, Hypertension, Metastatic adenocarcinoma to prostate ([place]) (2017), Multiple sclerosis ([place]) (1991), Renal cyst, left, and Ulcerative colitis ([place]). here with     ICD-10-CM   1. Transient global amnesia  G45.4   2. Numbness  R20.0   3. Uncomplicated alcohol dependence ([place])  F10.20   4. Multiple falls  R29.6     Fortunately, TM has had no returning symptoms of amnesia.  He does continue to have concerns of numbness of bilateral feet and restless leg syndrome.  He has been working closely with his primary care provider who suggested he continue with plans to have NCS/EMG.  We will get patient scheduled today.  He was advised to continue close follow-up with primary care.  He was advised to consider physical therapy referral for concerns of falls.  Fall precautions given.   He will follow-up as needed pending results.  He and his wife both verbalized understanding and agreement with this plan.   No orders of the defined types were placed in this encounter.    No orders of the defined types were placed in this encounter.     Debbora Presto, FNP-C 07/19/2019, 12:11 PM [facility] [address], [place], [place] [address] [phone]

## 2019-07-28 ENCOUNTER — Ambulatory Visit (INDEPENDENT_AMBULATORY_CARE_PROVIDER_SITE_OTHER): Payer: Commercial Managed Care - PPO | Admitting: Neurology

## 2019-07-28 ENCOUNTER — Other Ambulatory Visit: Payer: Self-pay

## 2019-07-28 ENCOUNTER — Encounter: Payer: Self-pay | Admitting: Neurology

## 2019-07-28 DIAGNOSIS — R202 Paresthesia of skin: Secondary | ICD-10-CM

## 2019-07-28 DIAGNOSIS — G600 Hereditary motor and sensory neuropathy: Secondary | ICD-10-CM

## 2019-07-28 DIAGNOSIS — R200 Anesthesia of skin: Secondary | ICD-10-CM

## 2019-07-28 DIAGNOSIS — Z820 Family history of epilepsy and other diseases of the nervous system: Secondary | ICD-10-CM

## 2019-07-28 HISTORY — DX: Hereditary motor and sensory neuropathy: G60.0

## 2019-07-28 NOTE — Procedures (Signed)
     HISTORY:  Gary [name] is a 63 year old gentleman with a progressive gait disorder.  He has a very strong family history of Charcot-Marie-Tooth disease with his mother who is affected and his brother is also affected.  The patient is being evaluated for possible neuropathy as a source of his gait disturbance.  NERVE CONDUCTION STUDIES:  Nerve conduction studies were performed on the right upper extremity.  The distal motor latencies for the right median and ulnar nerves were significantly prolonged with low motor amplitudes for these nerves.  Significant slowing was seen for the median and ulnar nerves on the right.  The right radial and right ulnar sensory latency is unobtainable, prolongation for the right median sensory latency is seen.  The F-wave latency for the right ulnar nerve is prolonged.  Nerve conduction studies were performed on both lower extremities.  No response was seen for the peroneal or posterior tibial nerves bilaterally.  The sural and peroneal sensory latencies were unobtainable bilaterally.  The F-wave latencies for the posterior tibial nerves were unobtainable bilaterally.  EMG STUDIES:  EMG study was performed on the right lower extremity:  The tibialis anterior muscle reveals 2 to 5K motor units with markedly reduced recruitment. No fibrillations or positive waves were seen. The peroneus tertius muscle reveals 2 to 4K motor units with markedly reduced recruitment. No fibrillations or positive waves were seen. The medial gastrocnemius muscle reveals 1 to 5K motor units with markedly reduced recruitment. No fibrillations or positive waves were seen. The vastus lateralis muscle reveals 2 to 4K motor units with slightly reduced recruitment. No fibrillations or positive waves were seen. The iliopsoas muscle reveals 2 to 4K motor units with full recruitment. No fibrillations or positive waves were seen. The biceps femoris muscle (long head) reveals 2 to 4K motor  units with full recruitment. No fibrillations or positive waves were seen. The lumbosacral paraspinal muscles were tested at 3 levels, and revealed no abnormalities of insertional activity at all 3 levels tested. There was good relaxation.   IMPRESSION:  Nerve conduction studies done on the right upper extremity and both lower extremities shows evidence of a severe peripheral neuropathy which appears to have some demyelinating features.  Given the history of Charcot-Marie-Tooth disease, this gentleman likely has a similar affliction, likely a type I Charcot-Marie-Tooth disease.  Jill Alexanders MD 07/28/2019 3:23 PM  [facility] [address] [place], [place] [address]  Phone [phone] Fax [phone]

## 2019-07-28 NOTE — Progress Notes (Signed)
Please refer to EMG and nerve conduction procedure note.  

## 2019-07-29 ENCOUNTER — Ambulatory Visit (INDEPENDENT_AMBULATORY_CARE_PROVIDER_SITE_OTHER): Payer: Commercial Managed Care - PPO | Admitting: Internal Medicine

## 2019-07-29 ENCOUNTER — Telehealth: Payer: Self-pay | Admitting: *Deleted

## 2019-07-29 VITALS — BP 136/67 | HR 47

## 2019-07-29 DIAGNOSIS — I10 Essential (primary) hypertension: Secondary | ICD-10-CM | POA: Diagnosis not present

## 2019-07-29 NOTE — Telephone Encounter (Signed)
-----   Message from Britt Bottom, MD sent at 07/29/2019 12:32 PM EDT ----- Please let the patient know that the NCV/EMG was consistent with hereditary polyneuropathy (he has a family history).  That would explain the numbness, foot weakness and balance issues

## 2019-07-29 NOTE — Telephone Encounter (Signed)
Called, LVM for pt about results. Gave [facility] phone number if he has further questions/concerns.

## 2019-07-29 NOTE — Progress Notes (Signed)
[place]    Nerve / Sites Muscle Latency Ref. Amplitude Ref. Rel Amp Segments Distance Velocity Ref. Area    ms ms mV mV %  cm m/s m/s mVms  R Median - APB     Wrist APB 12.2 ?4.4 3.0 ?4.0 100 Wrist - APB 7   15.5     Upper arm APB 24.1  2.8  95.3 Upper arm - Wrist 26 22 ?49 14.2  R Ulnar - ADM     Wrist ADM 6.5 ?3.3 3.1 ?6.0 100 Wrist - ADM 7   11.9     B.Elbow ADM 17.3  2.6  86.1 B.Elbow - Wrist 23 21 ?49 8.2     A.Elbow ADM 22.0  2.8  106 A.Elbow - B.Elbow 10 21 ?49 10.3         A.Elbow - Wrist      R Peroneal - EDB     Ankle EDB NR ?6.5 NR ?2.0 NR Ankle - EDB 9   NR     Fib head EDB NR  NR  NR Fib head - Ankle 34 NR ?44 NR     Pop fossa EDB NR  NR  NR Pop fossa - Fib head 10 NR ?44 NR         Pop fossa - Ankle      L Peroneal - EDB     Ankle EDB NR ?6.5 NR ?2.0 NR Ankle - EDB 9   NR     Fib head EDB NR  NR  NR Fib head - Ankle 34 NR ?44 NR     Pop fossa EDB NR  NR  NR Pop fossa - Fib head 10 NR ?44 NR         Pop fossa - Ankle      R Tibial - AH     Ankle AH NR ?5.8 NR ?4.0 NR Ankle - AH 9   NR     Pop fossa AH NR  NR  NR Pop fossa - Ankle 42 NR ?41 NR  L Tibial - AH     Ankle AH NR ?5.8 NR ?4.0 NR Ankle - AH 9   NR     Pop fossa AH NR  NR  NR Pop fossa - Ankle 42 NR ?41 NR                  SNC    Nerve / Sites Rec. Site Peak Lat Ref.  Amp Ref. Segments Distance    ms ms V V  cm  R Radial - Anatomical snuff box (Forearm)     Forearm Wrist NR ?2.9 NR ?15 Forearm - Wrist 10  R Sural - Ankle (Calf)     Calf Ankle NR ?4.4 NR ?6 Calf - Ankle 14  L Sural - Ankle (Calf)     Calf Ankle NR ?4.4 NR ?6 Calf - Ankle 14  R Superficial peroneal - Ankle     Lat leg Ankle NR ?4.4 NR ?6 Lat leg - Ankle 14  L Superficial peroneal - Ankle     Lat leg Ankle NR ?4.4 NR ?6 Lat leg - Ankle 14  R Median - Orthodromic (Dig II, Mid palm)     Dig II Wrist 4.3 ?3.4 2 ?10 Dig II - Wrist 13  R Ulnar - Orthodromic, (Dig V, Mid palm)     Dig V Wrist NR ?3.1 NR ?5 Dig V - Wrist 11  F  Wave    Nerve F Lat Ref.   ms ms  R Tibial - AH NR ?56.0  L Tibial - AH NR ?56.0  R Ulnar - ADM 36.6 ?32.0

## 2019-07-29 NOTE — Progress Notes (Signed)
Pt here for Blood pressure check per Dr. Larose Kells  Pt currently takes: Coreg 61m bid, amlodipine 126m and lisinopril 4067m Pt reports compliance with medication.  BP today @ = 136/67 HR = 47  Pt advised per Dr. PazLarose Kellsood pressure looks great and to keep a check on heart rate.  Let us [place] if you feel any weakness and dizziness as this can be caused by the Coreg.  We will follow up at CPE in November.  Patient agreed with plan and will check blood pressure and pulse rate at home.  JosKathlene NovemberD

## 2019-08-04 ENCOUNTER — Other Ambulatory Visit: Payer: Self-pay | Admitting: Internal Medicine

## 2019-08-05 MED ORDER — CARVEDILOL 25 MG PO TABS: 25.0000 mg | ORAL_TABLET | Freq: Two times a day (BID) | ORAL | 0 refills | Status: DC

## 2019-08-10 ENCOUNTER — Encounter: Payer: Self-pay | Admitting: Internal Medicine

## 2019-08-10 ENCOUNTER — Other Ambulatory Visit: Payer: Self-pay

## 2019-08-11 ENCOUNTER — Ambulatory Visit: Payer: Commercial Managed Care - PPO | Admitting: Internal Medicine

## 2019-08-11 ENCOUNTER — Other Ambulatory Visit: Payer: Self-pay

## 2019-08-11 ENCOUNTER — Encounter: Payer: Self-pay | Admitting: Internal Medicine

## 2019-08-11 VITALS — BP 140/63 | HR 47 | Temp 96.4°F | Resp 16 | Ht 72.0 in | Wt 228.4 lb

## 2019-08-11 DIAGNOSIS — G600 Hereditary motor and sensory neuropathy: Secondary | ICD-10-CM

## 2019-08-11 DIAGNOSIS — E039 Hypothyroidism, unspecified: Secondary | ICD-10-CM

## 2019-08-11 DIAGNOSIS — Z0000 Encounter for general adult medical examination without abnormal findings: Secondary | ICD-10-CM

## 2019-08-11 DIAGNOSIS — E875 Hyperkalemia: Secondary | ICD-10-CM

## 2019-08-11 DIAGNOSIS — E785 Hyperlipidemia, unspecified: Secondary | ICD-10-CM | POA: Diagnosis not present

## 2019-08-11 DIAGNOSIS — I10 Essential (primary) hypertension: Secondary | ICD-10-CM

## 2019-08-11 DIAGNOSIS — R739 Hyperglycemia, unspecified: Secondary | ICD-10-CM | POA: Diagnosis not present

## 2019-08-11 LAB — COMPREHENSIVE METABOLIC PANEL
ALT: 23 U/L (ref 0–53)
AST: 19 U/L (ref 0–37)
Albumin: 4.5 g/dL (ref 3.5–5.2)
Alkaline Phosphatase: 99 U/L (ref 39–117)
BUN: 8 mg/dL (ref 6–23)
CO2: 27 mEq/L (ref 19–32)
Calcium: 9.2 mg/dL (ref 8.4–10.5)
Chloride: 105 mEq/L (ref 96–112)
Creatinine, Ser: 0.63 mg/dL (ref 0.40–1.50)
GFR: 156.73 mL/min (ref >60.00)
Glucose, Bld: 112 mg/dL — ABNORMAL HIGH (ref 70–99)
Potassium: 3.1 mEq/L — ABNORMAL LOW (ref 3.5–5.1)
Sodium: 141 mEq/L (ref 135–145)
Total Bilirubin: 1 mg/dL (ref 0.2–1.2)
Total Protein: 7.3 g/dL (ref 6.0–8.3)

## 2019-08-11 LAB — LIPID PANEL
Cholesterol: 135 mg/dL (ref 0–200)
HDL: 42.2 mg/dL (ref >39.00)
LDL Cholesterol: 72 mg/dL (ref 0–99)
NonHDL: 93.16
Total CHOL/HDL Ratio: 3
Triglycerides: 104 mg/dL (ref 0.0–149.0)
VLDL: 20.8 mg/dL (ref 0.0–40.0)

## 2019-08-11 LAB — CBC WITH DIFFERENTIAL/PLATELET
Basophils Absolute: 0.1 10*3/uL (ref 0.0–0.1)
Basophils Relative: 1.2 % (ref 0.0–3.0)
Eosinophils Absolute: 0.2 10*3/uL (ref 0.0–0.7)
Eosinophils Relative: 3.8 % (ref 0.0–5.0)
HCT: 40.2 % (ref 39.0–52.0)
Hemoglobin: 13.7 g/dL (ref 13.0–17.0)
Lymphocytes Relative: 32.1 % (ref 12.0–46.0)
Lymphs Abs: 1.7 10*3/uL (ref 0.7–4.0)
MCHC: 34.1 g/dL (ref 30.0–36.0)
MCV: 85.9 fl (ref 78.0–100.0)
Monocytes Absolute: 0.4 10*3/uL (ref 0.1–1.0)
Monocytes Relative: 7 % (ref 3.0–12.0)
Neutro Abs: 3 10*3/uL (ref 1.4–7.7)
Neutrophils Relative %: 55.9 % (ref 43.0–77.0)
Platelets: 186 10*3/uL (ref 150.0–400.0)
RBC: 4.68 Mil/uL (ref 4.22–5.81)
RDW: 14 % (ref 11.5–15.5)
WBC: 5.3 10*3/uL (ref 4.0–10.5)

## 2019-08-11 LAB — TSH: TSH: 3.11 u[IU]/mL (ref 0.35–4.50)

## 2019-08-11 NOTE — Progress Notes (Signed)
Subjective:    Patient ID: Gary [name], male    DOB: [birth year], 63 y.o.   MRN: [id number]  DOS:  08/11/2019 Type of visit - description: CPX In general feeling well. Currently not working Continue with neuropathy symptoms mostly lack of stability, he cannot safely go up or down the stairs. On Zoloft, symptoms controlled   Review of Systems Other than above, a 14 point review of systems is negative    Past Medical History:  Diagnosis Date  . Anxiety   . CAD (coronary artery disease) 10-2012   h/o stents   . Charcot-Marie disease 07/28/2019  . Elevated PSA    Prostate Bx w/ Dr. Tresa [name]  . EtOH dependence ([facility])   . Family history of adverse reaction to anesthesia    made father disoriented  . Gross hematuria   . Hepatic steatosis   . Hyperlipidemia LDL goal < 70   . Hypertension   . Metastatic adenocarcinoma to prostate ([place]) 2017   Gleason score 9, metastasis to R external Iliac node and large extraprostatic extension at prostatectomy  . Multiple sclerosis ([place]) 1991   Bilateral optic neuritis, treated and resolved but felt to be a manifestation of MS.  . Renal cyst, left    Bosniak 2K (indeterminant) hyperdense cyst CT 2014, Stable CT in 2016  . Ulcerative colitis [facility])     Past Surgical History:  Procedure Laterality Date  . FOOT SURGERY     R toe Fx (screws),  L dorsal FX (screws)  . LEFT HEART CATHETERIZATION WITH CORONARY ANGIOGRAM N/A 10/22/2012   Procedure: LEFT HEART CATHETERIZATION WITH CORONARY ANGIOGRAM;  Surgeon: Sinclair Grooms, MD;  Location: [facility] CATH LAB;  Service: Cardiovascular;  Laterality: N/A;  . LYMPHADENECTOMY Bilateral 11/24/2015   Procedure: BILATERAL PELVIC LYMPHADENECTOMY;  Surgeon: Alexis Frock, MD;  Location: [facility] ORS;  Service: Urology;  Laterality: Bilateral;  . PERCUTANEOUS CORONARY STENT INTERVENTION (PCI-S) N/A 10/23/2012   Procedure: PERCUTANEOUS CORONARY STENT INTERVENTION (PCI-S);  Surgeon: Sinclair Grooms, MD;  Location[facility] CATH  LAB;  Service: Cardiovascular;  Laterality: N/A;  . PROSTATE BIOPSY  09/2015   Dr. Tresa [name]  . ROBOT ASSISTED LAPAROSCOPIC RADICAL PROSTATECTOMY N/A 11/24/2015   Procedure: XI ROBOTIC ASSISTED LAPAROSCOPIC RADICAL PROSTATECTOMY WITH INDOCYANINE GREEN DYE;  Surgeon: Alexis Frock, MD;  Location: [facility] ORS;  Service: Urology;  Laterality: N/A;  . VASECTOMY     Family History  Problem Relation Age of Onset  . Dementia Mother   . Hypertension Mother   . Coronary artery disease Father   . Diabetes Mellitus II Father   . Lupus Sister   . Coronary artery disease Brother   . Diabetes Brother   . Hypertension Brother   . Prostate cancer Brother   . Thyroid cancer Sister   . Heart failure Sister   . Colon cancer Neg Hx     Social History   Socioeconomic History  . Marital status: Married    Spouse name: Not on file  . Number of children: 1  . Years of education: Not on file  . Highest education level: Not on file  Occupational History  . Occupation: unemployed   Social Needs  . Financial resource strain: Not on file  . Food insecurity    Worry: Not on file    Inability: Not on file  . Transportation needs    Medical: Not on file    Non-medical: Not on file  Tobacco Use  . Smoking status: Former Smoker  Quit date: 07/18/1990    Years since quitting: 29.1  . Smokeless tobacco: Former Systems developer  . Tobacco comment: 1991  Substance and Sexual Activity  . Alcohol use: Yes    Comment: weekly  . Drug use: Yes    Types: Marijuana  . Sexual activity: Not on file  Lifestyle  . Physical activity    Days per week: Not on file    Minutes per session: Not on file  . Stress: Not on file  Relationships  . Social Herbalist on phone: Not on file    Gets together: Not on file    Attends religious service: Not on file    Active member of club or organization: Not on file    Attends meetings of clubs or organizations: Not on file    Relationship status: Not on file  . Intimate  partner violence    Fear of current or ex partner: Not on file    Emotionally abused: Not on file    Physically abused: Not on file    Forced sexual activity: Not on file  Other Topics Concern  . Not on file  Social History Narrative   Household-- pt and wife   Has an adult son      Allergies as of 08/11/2019   No Known Allergies     Medication List       Accurate as of August 11, 2019 11:59 PM. If you have any questions, ask your nurse or doctor.        amLODipine 10 MG tablet Commonly known as: NORVASC Take 1 tablet (10 mg total) by mouth daily.   aspirin 81 MG tablet Take 81 mg by mouth daily.   atorvastatin 20 MG tablet Commonly known as: LIPITOR TAKE 1 TABLET BY MOUTH  DAILY   carvedilol 25 MG tablet Commonly known as: COREG Take 1 tablet (25 mg total) by mouth 2 (two) times daily with a meal.   levothyroxine 88 MCG tablet Commonly known as: SYNTHROID Take 1 tablet (88 mcg total) by mouth daily before breakfast.   lisinopril 40 MG tablet Commonly known as: ZESTRIL Take 1 tablet (40 mg total) by mouth daily.   mesalamine 1000 MG suppository Commonly known as: CANASA   Nasal Decongestant Spray 0.05 % nasal spray Generic drug: oxymetazoline Place 2 sprays into both nostrils 2 (two) times daily as needed for congestion. As directed   nitroGLYCERIN 0.4 MG SL tablet Commonly known as: NITROSTAT Place 1 tablet (0.4 mg total) under the tongue every 5 (five) minutes as needed for chest pain.   sertraline 100 MG tablet Commonly known as: ZOLOFT Take 1 tablet (100 mg total) by mouth daily.   sodium chloride 0.65 % Soln nasal spray Commonly known as: OCEAN Place 1 spray into both nostrils as needed for congestion.   Zytiga 250 MG tablet Generic drug: abiraterone acetate TAKE 4 TABLETS (1,000MG) BY MOUTH ONCE DAILY ON AN  EMPTY STOMACH 1 HOUR BEFORE OR 2 HOURS AFTER A MEAL           Objective:   Physical Exam BP 140/63 (BP Location: Left Arm,  Patient Position: Sitting, Cuff Size: Normal)   Pulse (!) 47   Temp (!) 96.4 F (35.8 C) (Temporal)   Resp 16   Ht 6' (1.829 m)   Wt 228 lb 6 oz (103.6 kg)   SpO2 97%   BMI 30.97 kg/m  General: Well developed, NAD, BMI noted Neck: No  thyromegaly  HEENT:  Normocephalic . Face symmetric, atraumatic Lungs:  CTA B Normal respiratory effort, no intercostal retractions, no accessory muscle use. Heart: RRR, soft systolic murmur.  No pretibial edema bilaterally  Abdomen:  Not distended, soft, non-tender. No rebound or rigidity.   Skin: Exposed areas without rash. Not pale. Not jaundice Neurologic:  alert & oriented X3.  Speech normal, gait  unassisted Strength symmetric and appropriate for age.  Psych: Cognition and judgment appear intact.  Cooperative with normal attention span and concentration.  Behavior appropriate. No anxious or depressed appearing.     Assessment    Assessment  Pre-diabetes HTN Hyperlipidemia Anxiety CAD s/p stents 2014  EtOH dependency Ulcerative colitis- last cscope 05-2015 GU: -Metastatic prostate cancer, DX 11-2015, radical prostatectomy 11-2015 --  continent as off  07-2016--on androgen depravation  -Hematuria, saw urology 01-2013 reviewed: CT show a renal cyst, cystoscopy negative, they recommended follow-up MRI for the renal cyst. NEURO: -MS  Dx 1991 (B neuritis, rx, resolved, felt to be d/t MS) -Bilateral thenar atrophy noted 06-2017 (apparently chronic) -TGA -Neuropathy: severe per  NCS 07-2019 +, likely Charcot Mary Tooth Nose bleed: R nostril artery cauterize 08-2017 TGA: DX 09/2018, MRI brain, EEG negative saw neurology in follow-up, carotid ultrasound ordered: 1 to 39% bilaterally.  PLAN Here for CPX Prediabetes: A1c stable over time HTN: Ambulatory BPs 120, 140.  Continue Amlodipine, carvedilol, lisinopril. EKG today: Sinus bradycardia, otherwise no acute changes Hyperlipidemia: Checking labs, on Lipitor EtOH: Drinks sporadically,  in moderation per patient Hypothyroidism: Check TSH, on Synthroid Severe neuropathy: Peripheral nerve conduction study 07-2019, see below, show neuropathy likely Charcot-Marie-Tooth disease.  Concerned about his prognosis, will mail him some information about it. NCS 07-2019 Nerve conduction studies done on the right upper extremity and both lower extremities shows evidence of a severe peripheral neuropathy which appears to have some demyelinating features.  Given the history of Charcot-Marie-Tooth disease, this gentleman likely has a similar affliction, likely a type I Charcot-Marie-Tooth disease. Anxiety: Despite all the challenges he remains okay with sertraline Disability?  Given the multitude of chronic medical problems I will support his disability. Heart murmur: Systolic, mild, echo?  Will be unable to afford it.  Observation RTC 4 months  Today, in addition to his physical exam, I spent more than 20   min with the patient: >50% of the time counseling regards his chronic medical issues including prediabetes, hypertension, hyperlipidemia, talking about possible disability.

## 2019-08-11 NOTE — Patient Instructions (Signed)
GO TO THE LAB : Get the blood work     GO TO THE FRONT DESK Schedule your next appointment   for a checkup in 4 months    STOP BY THE FIRST FLOOR:  get the XR     Check the  blood pressure every week BP GOAL is between 110/65 and  135/85. If it is consistently higher or lower, let me know

## 2019-08-11 NOTE — Progress Notes (Signed)
Pre visit review using our clinic review tool, if applicable. No additional management support is needed unless otherwise documented below in the visit note. 

## 2019-08-16 ENCOUNTER — Other Ambulatory Visit: Payer: Self-pay | Admitting: Internal Medicine

## 2019-08-18 ENCOUNTER — Encounter: Payer: Self-pay | Admitting: Internal Medicine

## 2019-08-18 NOTE — Assessment & Plan Note (Signed)
Td 2015, pnm 23-2015; prevnar--2017;  S/p Shingrix;  had a flu s-hot -CCS S/p colonoscopy (~ 2011?) showed adenomatous polyps Cscope 05-2015: mild erythema, no polyps Cscope 06-2019, Dr Cristina Gong, will get records  -H/o Prostate cancer , per urology, responding very well with current treatments . -Labs: CMP, FLP, CBC

## 2019-08-18 NOTE — Assessment & Plan Note (Signed)
Here for CPX Prediabetes: A1c stable over time HTN: Ambulatory BPs 120, 140.  Continue Amlodipine, carvedilol, lisinopril. EKG today: Sinus bradycardia, otherwise no acute changes Hyperlipidemia: Checking labs, on Lipitor EtOH: Drinks sporadically, in moderation per patient Hypothyroidism: Check TSH, on Synthroid Severe neuropathy: Peripheral nerve conduction study 07-2019, see below, show neuropathy likely Charcot-Marie-Tooth disease.  Concerned about his prognosis, will mail him some information about it. NCS 07-2019 Nerve conduction studies done on the right upper extremity and both lower extremities shows evidence of a severe peripheral neuropathy which appears to have some demyelinating features.  Given the history of Charcot-Marie-Tooth disease, this gentleman likely has a similar affliction, likely a type I Charcot-Marie-Tooth disease. Anxiety: Despite all the challenges he remains okay with sertraline Disability?  Given the multitude of chronic medical problems I will support his disability. Heart murmur: Systolic, mild, echo?  Will be unable to afford it.  Observation RTC 4 months

## 2019-08-23 ENCOUNTER — Encounter: Payer: Self-pay | Admitting: Internal Medicine

## 2019-09-09 ENCOUNTER — Encounter: Payer: Self-pay | Admitting: Internal Medicine

## 2019-09-09 ENCOUNTER — Ambulatory Visit: Payer: Commercial Managed Care - PPO | Admitting: Internal Medicine

## 2019-09-09 ENCOUNTER — Other Ambulatory Visit: Payer: Self-pay

## 2019-09-09 VITALS — BP 170/90 | HR 54 | Ht 72.0 in | Wt 235.0 lb

## 2019-09-09 DIAGNOSIS — I10 Essential (primary) hypertension: Secondary | ICD-10-CM

## 2019-09-09 LAB — POTASSIUM: Potassium: 3.5 mEq/L (ref 3.5–5.1)

## 2019-09-09 NOTE — Progress Notes (Addendum)
Patient ID: Gary [name], male   DOB: 02/11/1956, 63 y.o.   MRN: [id number]   HPI  Gary [name] is a 63 y.o.-year-old male, referred by his PCP, Dr. Larose Kells, for evaluation for uncontrolled hypertension, for evaluation for endocrine causes for HTN.  Patient describes that he was diagnosed with hypertension in ~2000.  Reviewed previous blood pressure levels: BP Readings from Last 3 Encounters:  08/11/19 140/63  07/29/19 136/67  07/19/19 131/78   At home, patient mentions that his blood pressure stays between 150s (170s)/70s-100s.    She is on the following antihypertensives regimen:  Amlodipine 10 mg daily  Carvedilol 25 mg 2x daily  Lisinopril 40 mg daily  She also has a history of off-and-on hypokalemia.  He is not on potassium supplements but trying to eat potassium rich foods.  Reviewed previous potassium levels: Lab Results  Component Value Date   K 3.1 (L) 08/11/2019   K 3.2 (L) 05/11/2019   K 3.9 09/18/2018   K 2.5 (LL) 09/16/2018   K 3.3 (L) 09/15/2018   K 3.9 08/19/2018   K 3.3 (L) 03/05/2018   K 4.1 12/04/2017   K 3.8 08/27/2017   K 3.3 (L) 08/16/2017   K 3.8 08/08/2017   K 3.2 (L) 08/01/2017   K 3.4 (L) 07/26/2017   K 3.5 07/24/2017   K 4.1 07/10/2017   K 3.8 05/29/2017   K 4.3 01/30/2017   K 3.7 01/30/2017   K 4.4 10/11/2016   K 3.7 08/07/2016   She denies hypertensive spells, headaches, pallor.  No history of hypo or hyperthyroidism.  TSH levels were normal: Lab Results  Component Value Date   TSH 3.11 08/11/2019   TSH 3.02 05/11/2019   TSH 1.52 08/19/2018   No diabetes or prediabetes: Lab Results  Component Value Date   HGBA1C 5.6 08/19/2018   HGBA1C 5.8 07/24/2017   HGBA1C 5.4 07/31/2016   HGBA1C 5.7 01/24/2016   HGBA1C 5.4 07/12/2015   HGBA1C 5.5 02/15/2014   HGBA1C 5.8 (H) 10/22/2012   No hyperparathyroidism or hypercalcemia: No results found for: PTH  Lab Results  Component Value Date   CALCIUM 9.2 08/11/2019   CALCIUM 9.4  05/11/2019   CALCIUM 9.9 09/18/2018   CALCIUM 9.2 09/16/2018   CALCIUM 9.3 09/15/2018   CALCIUM 9.5 08/19/2018   CALCIUM 9.4 03/05/2018   CALCIUM 9.8 12/04/2017   CALCIUM 9.4 08/27/2017   CALCIUM 8.6 (L) 08/16/2017   CALCIUM 9.7 08/08/2017   CALCIUM 8.7 (L) 08/01/2017   CALCIUM 9.1 07/26/2017   CALCIUM 9.5 07/24/2017   CALCIUM 9.6 07/10/2017   CALCIUM 9.7 05/29/2017   CALCIUM 9.7 01/30/2017   CALCIUM 9.7 01/30/2017   CALCIUM 10.1 10/11/2016   CALCIUM 9.5 08/07/2016   Pt. denies use of stimulants.   Denies street drug use.   Denies licorice use. Not on steroids. He was on Prednisone 5 mg daily but not in the last year.  He has a history of metastatic prostate cancer, CAD-history of 3 stent, EtOH dependence, marijuana use, MS, HL-on atorvastatin, ulcerative colitis, hepatic steatosis, Charcot Marie Tooth disease, hypothyroidism-latest TSH normal 08/2019, anxiety, hypogonadism.  Also, per review of the chart is a carrier of hereditary hemochromatosis mutation for analysis in 12/2011.  He has a history of HTN in mother and brothers, and possibly in 1 sister; CAD in father and brother; CHF and SLE in sister.  His brother needs a kidney transplant and he would be interested to donate one of his kidneys  ROS: Constitutional: no Weight gain, + weight loss (he feels he lost 30 pounds in the last 6 months), + fatigue, + hot flashes, + nocturia, + excessive urination Eyes: no blurry vision, no xerophthalmia ENT: no sore throat, no nodules palpated in throat, no dysphagia/odynophagia, no hoarseness, + tinnitus x1 Cardiovascular: no CP/SOB/palpitations/leg swelling Respiratory: no cough/SOB Gastrointestinal: no N/V/D/C Musculoskeletal: no muscle/joint aches Skin: no rashes Neurological: no tremors/numbness/tingling/dizziness Psychiatric: no depression/+ anxiety (improved on Zoloft) + Problems with erections, libido, enlarged breasts  Past Medical History:  Diagnosis Date  . Anxiety    . CAD (coronary artery disease) 10-2012   h/o stents   . Charcot-Marie disease 07/28/2019  . Elevated PSA    Prostate Bx w/ Dr. Tresa [name]  . EtOH dependence ([place])   . Family history of adverse reaction to anesthesia    made father disoriented  . Gross hematuria   . Hepatic steatosis   . Hyperlipidemia LDL goal < 70   . Hypertension   . Metastatic adenocarcinoma to prostate ([place]) 2017   Gleason score 9, metastasis to R external Iliac node and large extraprostatic extension at prostatectomy  . Multiple sclerosis ([place]) 1991   Bilateral optic neuritis, treated and resolved but felt to be a manifestation of MS.  . Renal cyst, left    Bosniak 2K (indeterminant) hyperdense cyst CT 2014, Stable CT in 2016  . Ulcerative colitis [facility])    Past Surgical History:  Procedure Laterality Date  . FOOT SURGERY     R toe Fx (screws),  L dorsal FX (screws)  . LEFT HEART CATHETERIZATION WITH CORONARY ANGIOGRAM N/A 10/22/2012   Procedure: LEFT HEART CATHETERIZATION WITH CORONARY ANGIOGRAM;  Surgeon: Sinclair Grooms, MD;  Location: [facility] CATH LAB;  Service: Cardiovascular;  Laterality: N/A;  . LYMPHADENECTOMY Bilateral 11/24/2015   Procedure: BILATERAL PELVIC LYMPHADENECTOMY;  Surgeon: Alexis Frock, MD;  Location: [facility] ORS;  Service: Urology;  Laterality: Bilateral;  . PERCUTANEOUS CORONARY STENT INTERVENTION (PCI-S) N/A 10/23/2012   Procedure: PERCUTANEOUS CORONARY STENT INTERVENTION (PCI-S);  Surgeon: Sinclair Grooms, MD;  Location: [facility] CATH LAB;  Service: Cardiovascular;  Laterality: N/A;  . PROSTATE BIOPSY  09/2015   Dr. Tresa [name]  . ROBOT ASSISTED LAPAROSCOPIC RADICAL PROSTATECTOMY N/A 11/24/2015   Procedure: XI ROBOTIC ASSISTED LAPAROSCOPIC RADICAL PROSTATECTOMY WITH INDOCYANINE GREEN DYE;  Surgeon: Alexis Frock, MD;  Location: [facility] ORS;  Service: Urology;  Laterality: N/A;  . VASECTOMY     Social History   Socioeconomic History  . Marital status: Married    Spouse name: Not on file  . Number of  children: 1  . Years of education: Not on file  . Highest education level: Not on file  Occupational History  . Occupation: unemployed   Social Needs  . Financial resource strain: Not on file  . Food insecurity    Worry: Not on file    Inability: Not on file  . Transportation needs    Medical: Not on file    Non-medical: Not on file  Tobacco Use  . Smoking status: Former Smoker    Quit date: 07/18/1990    Years since quitting: 29.1  . Smokeless tobacco: Former Systems developer  . Tobacco comment: 1991  Substance and Sexual Activity  . Alcohol use: Yes    Comment: weekly  . Drug use: Yes    Types: Marijuana  . Sexual activity: Not on file  Lifestyle  . Physical activity    Days per week: Not on file    Minutes  per session: Not on file  . Stress: Not on file  Relationships  . Social Herbalist on phone: Not on file    Gets together: Not on file    Attends religious service: Not on file    Active member of club or organization: Not on file    Attends meetings of clubs or organizations: Not on file    Relationship status: Not on file  . Intimate partner violence    Fear of current or ex partner: Not on file    Emotionally abused: Not on file    Physically abused: Not on file    Forced sexual activity: Not on file  Other Topics Concern  . Not on file  Social History Narrative   Household-- pt and wife   Has an adult son   Current Outpatient Medications on File Prior to Visit  Medication Sig Dispense Refill  . amLODipine (NORVASC) 10 MG tablet Take 1 tablet (10 mg total) by mouth daily. 90 tablet 1  . aspirin 81 MG tablet Take 81 mg by mouth daily.    Marland Kitchen atorvastatin (LIPITOR) 20 MG tablet TAKE 1 TABLET BY MOUTH  DAILY 90 tablet 1  . carvedilol (COREG) 25 MG tablet Take 1 tablet (25 mg total) by mouth 2 (two) times daily with a meal. 180 tablet 0  . levothyroxine (SYNTHROID) 88 MCG tablet Take 1 tablet (88 mcg total) by mouth daily before breakfast. 90 tablet 1  .  lisinopril (PRINIVIL,ZESTRIL) 40 MG tablet Take 1 tablet (40 mg total) by mouth daily. 90 tablet 1  . mesalamine (CANASA) 1000 MG suppository     . nitroGLYCERIN (NITROSTAT) 0.4 MG SL tablet Place 1 tablet (0.4 mg total) under the tongue every 5 (five) minutes as needed for chest pain. (Patient not taking: Reported on 08/11/2019) 25 tablet 3  . oxymetazoline (NASAL DECONGESTANT SPRAY) 0.05 % nasal spray Place 2 sprays into both nostrils 2 (two) times daily as needed for congestion. As directed    . sertraline (ZOLOFT) 100 MG tablet Take 1 tablet (100 mg total) by mouth daily. 90 tablet 2  . sodium chloride (OCEAN) 0.65 % SOLN nasal spray Place 1 spray into both nostrils as needed for congestion.    Marland Kitchen ZYTIGA 250 MG tablet TAKE 4 TABLETS (1,000MG) BY MOUTH ONCE DAILY ON AN  EMPTY STOMACH 1 HOUR BEFORE OR 2 HOURS AFTER A MEAL 120 tablet 1   No current facility-administered medications on file prior to visit.    No Known Allergies Family History  Problem Relation Age of Onset  . Dementia Mother   . Hypertension Mother   . Coronary artery disease Father   . Diabetes Mellitus II Father   . Lupus Sister   . Coronary artery disease Brother   . Diabetes Brother   . Hypertension Brother   . Prostate cancer Brother   . Thyroid cancer Sister   . Heart failure Sister   . Colon cancer Neg Hx    PE: BP (!) 170/90   Pulse (!) 54   Ht 6' (1.829 m)   Wt 235 lb (106.6 kg)   SpO2 99%   BMI 31.87 kg/m  Wt Readings from Last 3 Encounters:  09/09/19 235 lb (106.6 kg)  08/11/19 228 lb 6 oz (103.6 kg)  07/19/19 235 lb (106.6 kg)   Constitutional: overweight, in NAD Eyes: PERRLA, EOMI ENT: moist mucous membranes, no thyromegaly,  no cervical lymphadenopathy Cardiovascular: RRR, No MRG Respiratory: CTA B  Gastrointestinal: abdomen soft, NT, ND, BS+ Musculoskeletal: no deformities, strength intact in all 4 Skin: moist, warm, no rashes Neurological: no tremor with outstretched hands, DTR normal in  all 4  ASSESSMENT: 1.  Uncontrolled hypertension  PLAN: 1.  Uncontrolled hypertension -Patient with longstanding uncontrolled hypertension, on 3 different antihypertensives.   -he was referred to endocrinology to rule out possible endocrine causes for hypertension - most commonly:  Primary hyperaldosteronism -we will check him for this  Hypo or hyperthyroidism -he had a normal TSH recently, on replacement  Cushing's syndrome -no signs or symptoms of excess cortisol production  Pheochromocytoma -no hypertensive spells, HAs, but will need to check him for this  Primary hyperparathyroidism -no history of elevated calcium -At today's visit, we we will check his plasma metanephrines and catecholamines to screen him for pheochromocytoma and also check aldosterone and plasma renin activity to screen him for primary hyperaldosteronism.  We did discuss that if he does primary hyperaldosteronism, this can be unilateral or bilateral.  We will need to check a CT scan and possibly even an adrenal venous sampling test.  1. If he has unilateral adrenal hyperaldosteronism (Conn syndrome), he would benefit from surgery >> he agrees with this 2. If he has bilateral adrenal hyperaldosteronism, he would benefit from spironolactone. - If the investigation is entirely negative, would consider checking a CT angio for FMD (fibromuscular dysplasia) - We will keep in touch and discuss about the results as soon as they return  - I will see him back in 3 months   Component     Latest Ref Rng & Units 09/09/2019          Epinephrine     pg/mL 55  Norepinephrine     pg/mL 603  Dopamine     pg/mL 20 (H)  Total Catecholamines     pg/mL 678  Metanephrine, Pl     <=57 pg/mL 39  Normetanephrine, Pl     <=148 pg/mL 97  Total Metanephrines-Plasma     <=205 pg/mL 136  ALDOSTERONE     ng/dL 6  Renin Activity     0.25 - 5.82 ng/mL/h 0.06 (L)  ALDO / PRA Ratio     0.9 - 28.9 Ratio 100.0 (H)  Potassium      3.5 - 5.1 mEq/L 3.5   Work-up is negative for pheochromocytoma, however, he does have a suppressed renin activity (this can be due to high salt diet) and normal aldosterone.  Since beta-blockers and ACE inhibitors can suppress the aldosterone and renin levels, I will advise him to switch to the following regimen for 2 weeks and then return for recheck: - Doxazosin 4 mg per day  - Verapamil SR 120 daily (can increase if needed).   - Amlodipine 10 mg daily -we can continue this  Component     Latest Ref Rng & Units 10/04/2019  ALDOSTERONE     ** ng/dL 2  Renin Activity     0.25 - 5.82 ng/mL/h 0.15 (L)  ALDO / PRA Ratio     0.9 - 28.9 Ratio 13.3  Potassium     3.5 - 5.1 mEq/L 3.0 (L)   Msg sent: Dear Gary [name], The results are back and they show that the aldosterone is now quite low and the renin activity is slightly higher than before, but still suppressed. Unfortunately, your potassium level was also low at the time of the labs and this can suppress the aldosterone. My suggestion would be to take a potassium  supplement before coming back to repeat these labs. I would like to send a prescription to your pharmacy. You can call and reschedule these labs at your convenience, but make sure you take the potassium the morning before the labs. Also, try to stay on the same blood pressure medicines, if possible. Please call our main office number [phone]) to schedule a lab appointment.  Sincerely, Philemon Kingdom MD  Component     Latest Ref Rng & Units 10/20/2019  ALDOSTERONE     ** ng/dL 4  Renin Activity     0.25 - 5.82 ng/mL/h 0.10 (L)  ALDO / PRA Ratio     0.9 - 28.9 Ratio 40.0 (H)  Potassium     3.5 - 5.1 mEq/L 3.6   Msg sent: Dear Gary [name], The aldosterone is still low (<5) while potassium is now normal. The renin is still suppressed. In this case, I do not see clear evidence of an aldosterone-producing tumor so I feel that we ruled out endocrine causes for  hypertension. In this case, I would suggest to follow with Dr. Larose Kells (we can cancel out next appt.).  One suggestion, since the renin activity is low, is to add a medication called Spironolactone to treat your hypertension. Please let me know if you have any questions. Sincerely, Philemon Kingdom MD  Philemon Kingdom, MD PhD [facility] Endocrinology

## 2019-09-09 NOTE — Patient Instructions (Signed)
Please stop at the lab.  Please come back for a follow-up appointment in 3 months.

## 2019-09-17 LAB — METANEPHRINES, PLASMA
Metanephrine, Free: 39 pg/mL (ref <=57)
Normetanephrine, Free: 97 pg/mL (ref <=148)
Total Metanephrines-Plasma: 136 pg/mL (ref <=205)

## 2019-09-17 LAB — CATECHOLAMINES, FRACTIONATED, PLASMA
Dopamine: 20 pg/mL — ABNORMAL HIGH
Epinephrine: 55 pg/mL
Norepinephrine: 603 pg/mL
Total Catecholamines: 678 pg/mL

## 2019-09-17 LAB — ALDOSTERONE + RENIN ACTIVITY W/ RATIO
ALDO / PRA Ratio: 100 Ratio — ABNORMAL HIGH (ref 0.9–28.9)
Aldosterone: 6 ng/dL
Renin Activity: 0.06 ng/mL/h — ABNORMAL LOW (ref 0.25–5.82)

## 2019-09-21 ENCOUNTER — Encounter: Payer: Self-pay | Admitting: Internal Medicine

## 2019-09-21 MED ORDER — VERAPAMIL HCL ER 120 MG PO TBCR: 120.0000 mg | EXTENDED_RELEASE_TABLET | Freq: Every day | ORAL | 1 refills | Status: DC

## 2019-09-21 MED ORDER — DOXAZOSIN MESYLATE 4 MG PO TABS: 4.0000 mg | ORAL_TABLET | Freq: Every day | ORAL | 1 refills | Status: DC

## 2019-09-22 ENCOUNTER — Other Ambulatory Visit: Payer: Self-pay

## 2019-09-22 MED ORDER — VERAPAMIL HCL ER 120 MG PO TBCR: 120.0000 mg | EXTENDED_RELEASE_TABLET | Freq: Every day | ORAL | 1 refills | Status: DC

## 2019-09-22 NOTE — Telephone Encounter (Signed)
See additional mychart message from 12/20/18.

## 2019-09-24 ENCOUNTER — Other Ambulatory Visit: Payer: Self-pay | Admitting: Internal Medicine

## 2019-09-25 ENCOUNTER — Other Ambulatory Visit: Payer: Self-pay | Admitting: Internal Medicine

## 2019-09-27 ENCOUNTER — Other Ambulatory Visit: Payer: Self-pay

## 2019-09-27 MED ORDER — VERAPAMIL HCL ER 120 MG PO TBCR: 120.0000 mg | EXTENDED_RELEASE_TABLET | Freq: Every day | ORAL | 1 refills | Status: DC

## 2019-09-27 MED ORDER — DOXAZOSIN MESYLATE 4 MG PO TABS: 4.0000 mg | ORAL_TABLET | Freq: Every day | ORAL | 1 refills | Status: DC

## 2019-10-04 ENCOUNTER — Telehealth: Payer: Self-pay

## 2019-10-04 ENCOUNTER — Other Ambulatory Visit (INDEPENDENT_AMBULATORY_CARE_PROVIDER_SITE_OTHER): Payer: Commercial Managed Care - PPO

## 2019-10-04 ENCOUNTER — Other Ambulatory Visit: Payer: Self-pay

## 2019-10-04 DIAGNOSIS — I10 Essential (primary) hypertension: Secondary | ICD-10-CM

## 2019-10-04 LAB — POTASSIUM: Potassium: 3 mEq/L — ABNORMAL LOW (ref 3.5–5.1)

## 2019-10-04 NOTE — Telephone Encounter (Signed)
Patient called in wanting to know if he should cancel his lab appt for today being that the medication Dr prescribed he wasn't able to to get both only one. Wants to know if appt needs to be pushed out     Please call and advise

## 2019-10-04 NOTE — Telephone Encounter (Signed)
No, he should still come!

## 2019-10-07 ENCOUNTER — Other Ambulatory Visit: Payer: Self-pay | Admitting: Oncology

## 2019-10-07 DIAGNOSIS — C61 Malignant neoplasm of prostate: Secondary | ICD-10-CM

## 2019-10-11 LAB — ALDOSTERONE + RENIN ACTIVITY W/ RATIO
ALDO / PRA Ratio: 13.3 Ratio (ref 0.9–28.9)
Aldosterone: 2 ng/dL
Renin Activity: 0.15 ng/mL/h — ABNORMAL LOW (ref 0.25–5.82)

## 2019-10-12 ENCOUNTER — Other Ambulatory Visit: Payer: Self-pay | Admitting: Internal Medicine

## 2019-10-12 DIAGNOSIS — I10 Essential (primary) hypertension: Secondary | ICD-10-CM

## 2019-10-12 MED ORDER — POTASSIUM CHLORIDE ER 10 MEQ PO TBCR: 30.0000 meq | EXTENDED_RELEASE_TABLET | Freq: Once | ORAL | 1 refills | Status: DC

## 2019-10-15 ENCOUNTER — Other Ambulatory Visit: Payer: Self-pay | Admitting: Internal Medicine

## 2019-10-19 ENCOUNTER — Ambulatory Visit: Payer: Commercial Managed Care - PPO | Admitting: Internal Medicine

## 2019-10-20 ENCOUNTER — Other Ambulatory Visit: Payer: Self-pay

## 2019-10-20 ENCOUNTER — Other Ambulatory Visit (INDEPENDENT_AMBULATORY_CARE_PROVIDER_SITE_OTHER): Payer: 59

## 2019-10-20 DIAGNOSIS — I10 Essential (primary) hypertension: Secondary | ICD-10-CM

## 2019-10-20 LAB — POTASSIUM: Potassium: 3.6 mEq/L (ref 3.5–5.1)

## 2019-10-22 ENCOUNTER — Other Ambulatory Visit: Payer: Self-pay | Admitting: Internal Medicine

## 2019-10-28 ENCOUNTER — Telehealth: Payer: Self-pay | Admitting: Internal Medicine

## 2019-10-28 MED ORDER — SERTRALINE HCL 100 MG PO TABS: 100.0000 mg | ORAL_TABLET | Freq: Every day | ORAL | 1 refills | Status: DC

## 2019-10-28 MED ORDER — CARVEDILOL 25 MG PO TABS: 25.0000 mg | ORAL_TABLET | Freq: Two times a day (BID) | ORAL | 1 refills | Status: DC

## 2019-10-28 MED ORDER — LEVOTHYROXINE SODIUM 88 MCG PO TABS: 88.0000 ug | ORAL_TABLET | Freq: Every day | ORAL | 1 refills | Status: DC

## 2019-10-28 MED ORDER — LISINOPRIL 40 MG PO TABS: 40.0000 mg | ORAL_TABLET | Freq: Every day | ORAL | 1 refills | Status: DC

## 2019-10-28 MED ORDER — AMLODIPINE BESYLATE 10 MG PO TABS: 10.0000 mg | ORAL_TABLET | Freq: Every day | ORAL | 1 refills | Status: DC

## 2019-10-28 MED ORDER — ATORVASTATIN CALCIUM 20 MG PO TABS: 20.0000 mg | ORAL_TABLET | Freq: Every day | ORAL | 1 refills | Status: DC

## 2019-10-28 NOTE — Telephone Encounter (Signed)
Requested Rx's except Zytiga sent. Dr. Alen Blew will need to send Zytiga as Dr. Larose Kells doesn't prescribe this.

## 2019-10-28 NOTE — Telephone Encounter (Signed)
Pt has changed insurance and will need his routine medications sent to the new mail order pharmacy in 3 month supply.  atorvastatin (LIPITOR) 20 MG tablet  (this was sent to [facility] and needs to be cancelled if possible)  ZYTIGA 250 MG tablet  amLODipine (NORVASC) 10 MG tablet  sertraline (ZOLOFT) 100 MG tablet levothyroxine (SYNTHROID) 88 MCG tablet  lisinopril (PRINIVIL,ZESTRIL) 40 MG tablet   Herbalist ([place], [place] Phone:  [phone]  Fax:  [phone]

## 2019-10-29 LAB — ALDOSTERONE + RENIN ACTIVITY W/ RATIO
ALDO / PRA Ratio: 40 Ratio — ABNORMAL HIGH (ref 0.9–28.9)
Aldosterone: 4 ng/dL
Renin Activity: 0.1 ng/mL/h — ABNORMAL LOW (ref 0.25–5.82)

## 2019-11-12 ENCOUNTER — Encounter: Payer: Self-pay | Admitting: Internal Medicine

## 2019-11-12 DIAGNOSIS — I10 Essential (primary) hypertension: Secondary | ICD-10-CM

## 2019-11-16 ENCOUNTER — Other Ambulatory Visit: Payer: Self-pay | Admitting: Internal Medicine

## 2019-11-17 ENCOUNTER — Encounter: Payer: Self-pay | Admitting: Internal Medicine

## 2019-11-17 MED ORDER — SERTRALINE HCL 100 MG PO TABS: 100.0000 mg | ORAL_TABLET | Freq: Every day | ORAL | 0 refills | Status: DC

## 2019-12-01 ENCOUNTER — Telehealth: Payer: Self-pay | Admitting: Oncology

## 2019-12-01 ENCOUNTER — Telehealth: Payer: Self-pay

## 2019-12-01 NOTE — Telephone Encounter (Signed)
Scheduled appt per 2/24 sch message - unable to reach pt- left message with appt date and time

## 2019-12-01 NOTE — Telephone Encounter (Signed)
Called patient to make him aware that he needs to be seen in the office before a Zytiga refill can be sent. No answer. Left a message making patient aware to expect a call from scheduling to set up a lab and MD visit and the reason this is needed and to call the office with any questions or concerns.

## 2019-12-01 NOTE — Telephone Encounter (Signed)
-----   Message from Wyatt Portela, MD sent at 12/01/2019  9:54 AM EST ----- No refill till he is seen again here. Lab + MD in the next 4 weeks is fine. Thanks ----- Message ----- From: Tami Lin, RN Sent: 12/01/2019   9:37 AM EST To: Wyatt Portela, MD  Received a call from [facility] requesting a new prescription for Zytiga. Last refill was sent 10/07/19. Patient does not any appointments scheduled. Ok to refill? Lanelle Bal

## 2019-12-03 ENCOUNTER — Other Ambulatory Visit: Payer: Self-pay

## 2019-12-03 ENCOUNTER — Telehealth: Payer: Self-pay

## 2019-12-03 ENCOUNTER — Other Ambulatory Visit (INDEPENDENT_AMBULATORY_CARE_PROVIDER_SITE_OTHER): Payer: 59

## 2019-12-03 DIAGNOSIS — C61 Malignant neoplasm of prostate: Secondary | ICD-10-CM

## 2019-12-03 DIAGNOSIS — I10 Essential (primary) hypertension: Secondary | ICD-10-CM

## 2019-12-03 LAB — BASIC METABOLIC PANEL
BUN: 12 mg/dL (ref 6–23)
CO2: 27 mEq/L (ref 19–32)
Calcium: 9.4 mg/dL (ref 8.4–10.5)
Chloride: 105 mEq/L (ref 96–112)
Creatinine, Ser: 0.64 mg/dL (ref 0.40–1.50)
GFR: 153.75 mL/min (ref >60.00)
Glucose, Bld: 112 mg/dL — ABNORMAL HIGH (ref 70–99)
Potassium: 3.9 mEq/L (ref 3.5–5.1)
Sodium: 141 mEq/L (ref 135–145)

## 2019-12-03 MED ORDER — ABIRATERONE ACETATE 250 MG PO TABS: ORAL_TABLET | ORAL | 1 refills | Status: DC

## 2019-12-03 NOTE — Telephone Encounter (Signed)
See message below. Refill request submitted to [facility].

## 2019-12-03 NOTE — Telephone Encounter (Signed)
-----   Message from Wyatt Portela, MD sent at 12/03/2019  8:59 AM EST ----- Regarding: RE: [place] to refill now. Thanks ----- Message ----- From: Teodoro Spray, RN Sent: 12/03/2019   8:48 AM EST To: Wyatt Portela, MD Subject: Refill Request                                 I received a refill request for Zytiga from Patient's pharmacy.  I see that Lanelle Bal had talked to you earlier this week in regards to refilling this request. Patient is now scheduled to see you on 12/14/19.  Would you like me to refill his prescription or still wait until you see him at his next appointment?   Please advise.   Thank you.

## 2019-12-07 ENCOUNTER — Telehealth: Payer: Self-pay

## 2019-12-07 NOTE — Telephone Encounter (Signed)
Oral Oncology Patient Advocate Encounter  Prior Authorization for [name] has been approved.    PA# [id number] Effective dates: 12/06/19 through 12/05/20   Oral Oncology Clinic will continue to follow.   [place] Patient [place] Phone [phone] Fax [phone] 12/07/2019 8:12 AM

## 2019-12-07 NOTE — Telephone Encounter (Signed)
Oral Oncology Patient Advocate Encounter  Received notification from [employer] that prior authorization for [name] is required.  PA submitted on CoverMyMeds Key [id number] Status is pending  Oral Oncology Clinic will continue to follow.  [place] Patient [place] Phone [phone] Fax [phone] 12/07/2019 8:11 AM

## 2019-12-08 ENCOUNTER — Telehealth: Payer: Self-pay

## 2019-12-08 NOTE — Telephone Encounter (Signed)
Called and spoke to Cetronia at [employer] and let her know that Dr. Alen Blew has been made aware and stated it is ok to refill Zytiga without Prednisone.

## 2019-12-08 NOTE — Telephone Encounter (Signed)
-----   Message from Wyatt Portela, MD sent at 12/08/2019  2:44 PM EST ----- Noted. Ok to refill Zytiga without prednisone.  Thanks ----- Message ----- From: Tami Lin, RN Sent: 12/08/2019   2:32 PM EST To: Wyatt Portela, MD  A pharmacist from [facility] called and wanted to make sure you are aware the patient is not and has not been taking Prednisone. She said she needs to make you aware of this before she fills the [place] prescription. Lanelle Bal

## 2019-12-13 ENCOUNTER — Other Ambulatory Visit: Payer: Self-pay

## 2019-12-13 DIAGNOSIS — C61 Malignant neoplasm of prostate: Secondary | ICD-10-CM

## 2019-12-14 ENCOUNTER — Other Ambulatory Visit: Payer: Self-pay

## 2019-12-14 ENCOUNTER — Telehealth: Payer: Self-pay | Admitting: Oncology

## 2019-12-14 ENCOUNTER — Inpatient Hospital Stay: Payer: 59

## 2019-12-14 ENCOUNTER — Inpatient Hospital Stay: Payer: 59 | Attending: Oncology | Admitting: Oncology

## 2019-12-14 VITALS — BP 143/71 | HR 51 | Temp 97.8°F | Resp 18 | Wt 236.6 lb

## 2019-12-14 DIAGNOSIS — E876 Hypokalemia: Secondary | ICD-10-CM | POA: Insufficient documentation

## 2019-12-14 DIAGNOSIS — Z79899 Other long term (current) drug therapy: Secondary | ICD-10-CM | POA: Insufficient documentation

## 2019-12-14 DIAGNOSIS — C61 Malignant neoplasm of prostate: Secondary | ICD-10-CM

## 2019-12-14 DIAGNOSIS — I10 Essential (primary) hypertension: Secondary | ICD-10-CM | POA: Diagnosis not present

## 2019-12-14 DIAGNOSIS — Z7982 Long term (current) use of aspirin: Secondary | ICD-10-CM | POA: Diagnosis not present

## 2019-12-14 LAB — CMP (CANCER CENTER ONLY)
ALT: 24 U/L (ref 0–44)
AST: 17 U/L (ref 15–41)
Albumin: 4.1 g/dL (ref 3.5–5.0)
Alkaline Phosphatase: 108 U/L (ref 38–126)
Anion gap: 8 (ref 5–15)
BUN: 12 mg/dL (ref 8–23)
CO2: 24 mmol/L (ref 22–32)
Calcium: 9.2 mg/dL (ref 8.9–10.3)
Chloride: 108 mmol/L (ref 98–111)
Creatinine: 0.79 mg/dL (ref 0.61–1.24)
GFR, Est AFR Am: >60 mL/min (ref >60)
GFR, Estimated: >60 mL/min (ref >60)
Glucose, Bld: 113 mg/dL — ABNORMAL HIGH (ref 70–99)
Potassium: 3.6 mmol/L (ref 3.5–5.1)
Sodium: 140 mmol/L (ref 135–145)
Total Bilirubin: 1.1 mg/dL (ref 0.3–1.2)
Total Protein: 7.8 g/dL (ref 6.5–8.1)

## 2019-12-14 LAB — CBC WITH DIFFERENTIAL (CANCER CENTER ONLY)
Abs Immature Granulocytes: 0.01 10*3/uL (ref 0.00–0.07)
Basophils Absolute: 0.1 10*3/uL (ref 0.0–0.1)
Basophils Relative: 1 %
Eosinophils Absolute: 0.2 10*3/uL (ref 0.0–0.5)
Eosinophils Relative: 4 %
HCT: 41.6 % (ref 39.0–52.0)
Hemoglobin: 14.3 g/dL (ref 13.0–17.0)
Immature Granulocytes: 0 %
Lymphocytes Relative: 40 %
Lymphs Abs: 2.1 10*3/uL (ref 0.7–4.0)
MCH: 28.4 pg (ref 26.0–34.0)
MCHC: 34.4 g/dL (ref 30.0–36.0)
MCV: 82.5 fL (ref 80.0–100.0)
Monocytes Absolute: 0.4 10*3/uL (ref 0.1–1.0)
Monocytes Relative: 7 %
Neutro Abs: 2.5 10*3/uL (ref 1.7–7.7)
Neutrophils Relative %: 48 %
Platelet Count: 178 10*3/uL (ref 150–400)
RBC: 5.04 MIL/uL (ref 4.22–5.81)
RDW: 13.2 % (ref 11.5–15.5)
WBC Count: 5.2 10*3/uL (ref 4.0–10.5)
nRBC: 0 % (ref 0.0–0.2)

## 2019-12-14 NOTE — Progress Notes (Signed)
Hematology and Oncology Follow Up Visit  Gary [name] [id number] 28-Apr-1956 64 y.o. 12/14/2019 10:10 AM Colon Branch, MDPaz, [name], MD   Principle Diagnosis:  64 year old man with biochemically recurrent prostate cancer diagnosed in 2016.  He has castration-sensitive with PSA of 60.98 and a Gleason score 4+5 = 9 at the time of diagnosis.    Prior Therapy:   He is status post robotic-assisted laparoscopic radical prostatectomy and bilateral pelvic lymphadenectomy on 11/24/2015. One positive lymph node was identified out of a 9 examined. The final pathological staging was T3b N1.  His follow-up PSA remained elevated with a PSA on 03/07/2016 was 3.88.  Current therapy:  Androgen deprivation under the care of Dr. Tresa [name]. Zytiga 1000 mg daily with prednisone 5 mg daily started in July 2017.  Interim History: Gary [name] returns today for a repeat evaluation.  Since the last visit, he reports no major changes in his health.  He continues to tolerate Zytiga without any major complaints.  Denies any nausea vomiting or abdominal pain.  Denies any recent hospitalizations or illnesses.  He denies any bone pain or pathological fractures.   Medications: Updated on review. Current Outpatient Medications  Medication Sig Dispense Refill  . abiraterone acetate (ZYTIGA) 250 MG tablet TAKE 4 TABLETS (1,000MG) BY MOUTH ONCE DAILY ON AN  EMPTY STOMACH 1 HOUR BEFORE OR 2 HOURS AFTER A MEAL 120 tablet 1  . amLODipine (NORVASC) 10 MG tablet Take 1 tablet (10 mg total) by mouth daily. 90 tablet 1  . aspirin 81 MG tablet Take 81 mg by mouth daily.    Marland Kitchen atorvastatin (LIPITOR) 20 MG tablet Take 1 tablet (20 mg total) by mouth daily. 90 tablet 1  . carvedilol (COREG) 25 MG tablet Take 1 tablet (25 mg total) by mouth 2 (two) times daily with a meal. 180 tablet 1  . levothyroxine (SYNTHROID) 88 MCG tablet Take 1 tablet (88 mcg total) by mouth daily before breakfast. 90 tablet 1  . lisinopril (ZESTRIL) 40 MG tablet  Take 1 tablet (40 mg total) by mouth daily. 90 tablet 1  . mesalamine (CANASA) 1000 MG suppository     . nitroGLYCERIN (NITROSTAT) 0.4 MG SL tablet Place 1 tablet (0.4 mg total) under the tongue every 5 (five) minutes as needed for chest pain. (Patient not taking: Reported on 08/11/2019) 25 tablet 3  . oxymetazoline (NASAL DECONGESTANT SPRAY) 0.05 % nasal spray Place 2 sprays into both nostrils 2 (two) times daily as needed for congestion. As directed    . sertraline (ZOLOFT) 100 MG tablet Take 1 tablet (100 mg total) by mouth daily. 15 tablet 0  . sodium chloride (OCEAN) 0.65 % SOLN nasal spray Place 1 spray into both nostrils as needed for congestion.     No current facility-administered medications for this visit.     Allergies: No Known Allergies     Physical Exam:  Blood pressure (!) 143/71, pulse (!) 51, temperature 97.8 F (36.6 C), temperature source Temporal, resp. rate 18, weight 236 lb 9.6 oz (107.3 kg), SpO2 98 %.    ECOG: 0    General appearance: Comfortable appearing without any discomfort Head: Normocephalic without any trauma Oropharynx: Mucous membranes are moist and pink without any thrush or ulcers. Eyes: Pupils are equal and round reactive to light. Lymph nodes: No cervical, supraclavicular, inguinal or axillary lymphadenopathy.   Heart:regular rate and rhythm.  S1 and S2 without leg edema. Lung: Clear without any rhonchi or wheezes.  No dullness to percussion.  Abdomin: Soft, nontender, nondistended with good bowel sounds.  No hepatosplenomegaly. Musculoskeletal: No joint deformity or effusion.  Full range of motion noted. Neurological: No deficits noted on motor, sensory and deep tendon reflex exam. Skin: No petechial rash or dryness.  Appeared moist.    Lab Results: Lab Results  Component Value Date   WBC 5.3 08/11/2019   HGB 13.7 08/11/2019   HCT 40.2 08/11/2019   MCV 85.9 08/11/2019   PLT 186.0 08/11/2019     Chemistry      Component Value  Date/Time   NA 141 12/03/2019 0919   NA 140 08/27/2017 1259   K 3.9 12/03/2019 0919   K 3.8 08/27/2017 1259   CL 105 12/03/2019 0919   CO2 27 12/03/2019 0919   CO2 26 08/27/2017 1259   BUN 12 12/03/2019 0919   BUN 10.5 08/27/2017 1259   CREATININE 0.64 12/03/2019 0919   CREATININE 0.78 03/05/2018 0753   CREATININE 0.8 08/27/2017 1259      Component Value Date/Time   CALCIUM 9.4 12/03/2019 0919   CALCIUM 9.4 08/27/2017 1259   ALKPHOS 99 08/11/2019 0855   ALKPHOS 91 08/27/2017 1259   AST 19 08/11/2019 0855   AST 41 (H) 03/05/2018 0753   AST 16 08/27/2017 1259   ALT 23 08/11/2019 0855   ALT 58 (H) 03/05/2018 0753   ALT 23 08/27/2017 1259   BILITOT 1.0 08/11/2019 0855   BILITOT 0.6 03/05/2018 0753   BILITOT 0.35 08/27/2017 1259       Results for KALMAN, NYLEN "TIM" (MRN [id number]) as of 12/14/2019 10:11  Ref. Range 03/05/2018 07:53  Prostate Specific Ag, Serum Latest Ref Range: 0.0 - 4.0 ng/mL <0.1       Impression and Plan:  64 year old man with:  1.  Advanced prostate cancer with biochemical relapse that is currently castration-sensitive since 2017.  He remains on Zytiga without any major complications although have not been following up regularly.  His PSA in 2019 was undetectable at that time.  Risks and benefits of continuing Zytiga long-term was reviewed at this time.  Complications including hypertension, adrenal insufficiency and hyperglycemia were reviewed.  Alternative treatment options would include discontinuation of Zytiga with androgen deprivation alone.  Systemic chemotherapy will be deferred if he developed castration -resistant disease.  For the time being, he opted to continue with Zytiga and we will continue to monitor.   2. Androgen depravation: He continues to receive that under the care of [facility].  I recommended continuing that indefinitely.  3. Hypertension: Blood pressure seems to be under reasonable control at this time.  4.  Hypokalemia: Electrolytes including potassium will be rechecked today and repleted as needed.  5. LFT monitoring: Last laboratory test in November 2020 showed normal liver function test at that time.  6. Follow-up: We will be in 4 to 6 months for repeat evaluation.  30  minutes were dedicated to this visit. The time was spent on reviewing laboratory data, discussing treatment options, and coordinating future plan of care.     Zola Button, MD 3/9/202110:10 AM

## 2019-12-14 NOTE — Telephone Encounter (Signed)
Scheduled appt per 3/9 los.

## 2019-12-15 ENCOUNTER — Ambulatory Visit: Payer: 59 | Admitting: Internal Medicine

## 2019-12-15 ENCOUNTER — Ambulatory Visit (INDEPENDENT_AMBULATORY_CARE_PROVIDER_SITE_OTHER): Payer: 59 | Admitting: Internal Medicine

## 2019-12-15 ENCOUNTER — Encounter: Payer: Self-pay | Admitting: Internal Medicine

## 2019-12-15 ENCOUNTER — Telehealth: Payer: Self-pay

## 2019-12-15 ENCOUNTER — Other Ambulatory Visit: Payer: Self-pay

## 2019-12-15 VITALS — BP 153/72 | HR 48 | Temp 95.9°F | Resp 18 | Ht 72.0 in | Wt 236.4 lb

## 2019-12-15 DIAGNOSIS — I10 Essential (primary) hypertension: Secondary | ICD-10-CM | POA: Diagnosis not present

## 2019-12-15 DIAGNOSIS — R739 Hyperglycemia, unspecified: Secondary | ICD-10-CM | POA: Diagnosis not present

## 2019-12-15 DIAGNOSIS — N529 Male erectile dysfunction, unspecified: Secondary | ICD-10-CM

## 2019-12-15 LAB — PROSTATE-SPECIFIC AG, SERUM (LABCORP): Prostate Specific Ag, Serum: <0.1 ng/mL (ref 0.0–4.0)

## 2019-12-15 MED ORDER — SILDENAFIL CITRATE 20 MG PO TABS: 60.0000 mg | ORAL_TABLET | Freq: Every evening | ORAL | 3 refills | Status: DC | PRN

## 2019-12-15 NOTE — Progress Notes (Signed)
Pre visit review using our clinic review tool, if applicable. No additional management support is needed unless otherwise documented below in the visit note. 

## 2019-12-15 NOTE — Patient Instructions (Addendum)
   GO TO THE FRONT DESK Come back for a check up in 4 months  , please make an appointment    Check the  blood pressure   weekly   BP GOAL is between 110/65 and  135/85. If it is consistently higher or lower, let me know  Do not mix sildenafil (viagra) with nitroglycerine

## 2019-12-15 NOTE — Telephone Encounter (Signed)
Called patient and made him aware of PSA results.

## 2019-12-15 NOTE — Progress Notes (Signed)
Subjective:    Patient ID: Gary [name], male    DOB: [birth year], 64 y.o.   MRN: [id number]  DOS:  12/15/2019 Type of visit - description: Routine follow-up We talk about prostate cancer, hypertension, request treatment for erectile dysfunction.   Review of Systems Overall feels well, anxiety depression controlled.  No major concerns  Past Medical History:  Diagnosis Date  . Anxiety   . CAD (coronary artery disease) 10-2012   h/o stents   . Charcot-Marie disease 07/28/2019  . Elevated PSA    Prostate Bx w/ Dr. Tresa [name]  . EtOH dependence ([place])   . Family history of adverse reaction to anesthesia    made father disoriented  . Gross hematuria   . Hepatic steatosis   . Hyperlipidemia LDL goal < 70   . Hypertension   . Metastatic adenocarcinoma to prostate ([place]) 2017   Gleason score 9, metastasis to R external Iliac node and large extraprostatic extension at prostatectomy  . Multiple sclerosis ([place]) 1991   Bilateral optic neuritis, treated and resolved but felt to be a manifestation of MS.  . Renal cyst, left    Bosniak 2K (indeterminant) hyperdense cyst CT 2014, Stable CT in 2016  . Ulcerative colitis [facility])     Past Surgical History:  Procedure Laterality Date  . FOOT SURGERY     R toe Fx (screws),  L dorsal FX (screws)  . LEFT HEART CATHETERIZATION WITH CORONARY ANGIOGRAM N/A 10/22/2012   Procedure: LEFT HEART CATHETERIZATION WITH CORONARY ANGIOGRAM;  Surgeon: Sinclair Grooms, MD;  Location: [facility] CATH LAB;  Service: Cardiovascular;  Laterality: N/A;  . LYMPHADENECTOMY Bilateral 11/24/2015   Procedure: BILATERAL PELVIC LYMPHADENECTOMY;  Surgeon: Alexis Frock, MD;  Location: [facility] ORS;  Service: Urology;  Laterality: Bilateral;  . PERCUTANEOUS CORONARY STENT INTERVENTION (PCI-S) N/A 10/23/2012   Procedure: PERCUTANEOUS CORONARY STENT INTERVENTION (PCI-S);  Surgeon: Sinclair Grooms, MD;  Location: [facility] CATH LAB;  Service: Cardiovascular;  Laterality: N/A;  . PROSTATE BIOPSY   09/2015   Dr. Tresa [name]  . ROBOT ASSISTED LAPAROSCOPIC RADICAL PROSTATECTOMY N/A 11/24/2015   Procedure: XI ROBOTIC ASSISTED LAPAROSCOPIC RADICAL PROSTATECTOMY WITH INDOCYANINE GREEN DYE;  Surgeon: Alexis Frock, MD;  Location: [facility] ORS;  Service: Urology;  Laterality: N/A;  . VASECTOMY      Allergies as of 12/15/2019   No Known Allergies     Medication List       Accurate as of December 15, 2019 11:59 PM. If you have any questions, ask your nurse or doctor.        abiraterone acetate 250 MG tablet Commonly known as: Zytiga TAKE 4 TABLETS (1,000MG) BY MOUTH ONCE DAILY ON AN  EMPTY STOMACH 1 HOUR BEFORE OR 2 HOURS AFTER A MEAL   amLODipine 10 MG tablet Commonly known as: NORVASC Take 1 tablet (10 mg total) by mouth daily.   aspirin 81 MG tablet Take 81 mg by mouth daily.   atorvastatin 20 MG tablet Commonly known as: LIPITOR Take 1 tablet (20 mg total) by mouth daily.   carvedilol 25 MG tablet Commonly known as: COREG Take 1 tablet (25 mg total) by mouth 2 (two) times daily with a meal.   levothyroxine 88 MCG tablet Commonly known as: SYNTHROID Take 1 tablet (88 mcg total) by mouth daily before breakfast.   lisinopril 40 MG tablet Commonly known as: ZESTRIL Take 1 tablet (40 mg total) by mouth daily.   mesalamine 1000 MG suppository Commonly known as: CANASA   Nasal  Decongestant Spray 0.05 % nasal spray Generic drug: oxymetazoline Place 2 sprays into both nostrils 2 (two) times daily as needed for congestion. As directed   nitroGLYCERIN 0.4 MG SL tablet Commonly known as: NITROSTAT Place 1 tablet (0.4 mg total) under the tongue every 5 (five) minutes as needed for chest pain.   sertraline 100 MG tablet Commonly known as: ZOLOFT Take 1 tablet (100 mg total) by mouth daily.   sildenafil 20 MG tablet Commonly known as: REVATIO Take 3-4 tablets (60-80 mg total) by mouth at bedtime as needed. Started by: [name], MD   sodium chloride 0.65 % Soln nasal spray Commonly  known as: OCEAN Place 1 spray into both nostrils as needed for congestion.          Objective:   Physical Exam BP (!) 153/72 (BP Location: Left Arm, Patient Position: Sitting, Cuff Size: Normal)   Pulse (!) 48   Temp (!) 95.9 F (35.5 C) (Temporal)   Resp 18   Ht 6' (1.829 m)   Wt 236 lb 6 oz (107.2 kg)   SpO2 98%   BMI 32.06 kg/m  General:   Well developed, NAD, BMI noted. HEENT:  Normocephalic . Face symmetric, atraumatic Lungs:  CTA B Normal respiratory effort, no intercostal retractions, no accessory muscle use. Heart: RRR, soft systolic murmur.  Lower extremities: no pretibial edema bilaterally  Skin: Not pale. Not jaundice Neurologic:  alert & oriented X3.  Speech normal, gait appropriate for age and unassisted Psych--  Cognition and judgment appear intact.  Cooperative with normal attention span and concentration.  Behavior appropriate. No anxious or depressed appearing.      Assessment     Assessment  Pre-diabetes HTN Hypokalemia: Saw endo 09/09/2019, w/u:no clear evidence of aldosterone producing tumor.  Spironolactone would be a good addition to her BP regimen Hyperlipidemia Anxiety CAD s/p stents 2014  EtOH dependency Ulcerative colitis- last cscope 05-2015 GU: -Metastatic prostate cancer, DX 11-2015, radical prostatectomy 11-2015 --  continent as off  07-2016--on androgen depravation  -Hematuria, saw urology 01-2013 reviewed: CT show a renal cyst, cystoscopy negative, they recommended follow-up MRI for the renal cyst. NEURO: -MS  Dx 1991 (B neuritis, rx, resolved, felt to be d/t MS) -Bilateral thenar atrophy noted 06-2017 (apparently chronic) -Neuropathy: severe per  [facility] 07-2019 +, likely Charcot Mary Tooth Nose bleed: R nostril artery cauterize 08-2017 -TGA: DX 09/2018, MRI brain, EEG negative saw neurology in follow-up, carotid ultrasound ordered: 1 to 39% bilaterally.  PLAN Prediabetes: We will check A1c on RTC HTN: Currently on amlodipine,  carvedilol, lisinopril.  BP today slightly elevated, yesterday at oncology 143/71.  Last BMP satisfactory, no change. In the future we might consider spironolactone if not well controlled CAD: Asymptomatic Prostate cancer: Last PSA undetectable, hematology note reviewed, they decided to continue Zytiga. Erectile dysfunction: Request treatment, sildenafil prescribed, what to expect, how to use and cost discussed with the patient. RTC 4 months   This visit occurred during the SARS-CoV-2 public health emergency.  Safety protocols were in place, including screening questions prior to the visit, additional usage of staff PPE, and extensive cleaning of exam room while observing appropriate contact time as indicated for disinfecting solutions.

## 2019-12-15 NOTE — Telephone Encounter (Signed)
-----   Message from Wyatt Portela, MD sent at 12/15/2019  8:18 AM EST ----- Please let him know his PSA is low

## 2019-12-16 DIAGNOSIS — Z0289 Encounter for other administrative examinations: Secondary | ICD-10-CM

## 2019-12-16 NOTE — Assessment & Plan Note (Signed)
Prediabetes: We will check A1c on RTC HTN: Currently on amlodipine, carvedilol, lisinopril.  BP today slightly elevated, yesterday at oncology 143/71.  Last BMP satisfactory, no change. In the future we might consider spironolactone if not well controlled CAD: Asymptomatic Prostate cancer: Last PSA undetectable, hematology note reviewed, they decided to continue Zytiga. Erectile dysfunction: Request treatment, sildenafil prescribed, what to expect, how to use and cost discussed with the patient. RTC 4 months

## 2020-01-03 ENCOUNTER — Other Ambulatory Visit: Payer: Self-pay | Admitting: Oncology

## 2020-01-03 DIAGNOSIS — C61 Malignant neoplasm of prostate: Secondary | ICD-10-CM

## 2020-02-03 ENCOUNTER — Encounter: Payer: Self-pay | Admitting: Internal Medicine

## 2020-02-03 DIAGNOSIS — H00019 Hordeolum externum unspecified eye, unspecified eyelid: Secondary | ICD-10-CM

## 2020-03-10 ENCOUNTER — Other Ambulatory Visit: Payer: Self-pay

## 2020-03-10 MED ORDER — CARVEDILOL 25 MG PO TABS: 25.0000 mg | ORAL_TABLET | Freq: Two times a day (BID) | ORAL | 1 refills | Status: DC

## 2020-03-10 MED ORDER — LISINOPRIL 40 MG PO TABS: 40.0000 mg | ORAL_TABLET | Freq: Every day | ORAL | 1 refills | Status: DC

## 2020-03-10 MED ORDER — SERTRALINE HCL 100 MG PO TABS: 100.0000 mg | ORAL_TABLET | Freq: Every day | ORAL | 1 refills | Status: DC

## 2020-03-10 MED ORDER — AMLODIPINE BESYLATE 10 MG PO TABS: 10.0000 mg | ORAL_TABLET | Freq: Every day | ORAL | 1 refills | Status: DC

## 2020-03-10 MED ORDER — LEVOTHYROXINE SODIUM 88 MCG PO TABS: 88.0000 ug | ORAL_TABLET | Freq: Every day | ORAL | 1 refills | Status: DC

## 2020-03-10 MED ORDER — ATORVASTATIN CALCIUM 20 MG PO TABS: 20.0000 mg | ORAL_TABLET | Freq: Every day | ORAL | 1 refills | Status: DC

## 2020-03-14 LAB — PSA: PSA: <0.015

## 2020-04-03 ENCOUNTER — Encounter: Payer: Self-pay | Admitting: Internal Medicine

## 2020-04-04 ENCOUNTER — Other Ambulatory Visit: Payer: Self-pay | Admitting: Oncology

## 2020-04-04 DIAGNOSIS — C61 Malignant neoplasm of prostate: Secondary | ICD-10-CM

## 2020-05-16 ENCOUNTER — Ambulatory Visit (INDEPENDENT_AMBULATORY_CARE_PROVIDER_SITE_OTHER): Payer: 59 | Admitting: Internal Medicine

## 2020-05-16 ENCOUNTER — Other Ambulatory Visit: Payer: Self-pay

## 2020-05-16 VITALS — BP 157/70 | HR 49 | Temp 98.2°F | Resp 12 | Ht 72.0 in | Wt 247.2 lb

## 2020-05-16 DIAGNOSIS — R739 Hyperglycemia, unspecified: Secondary | ICD-10-CM | POA: Diagnosis not present

## 2020-05-16 DIAGNOSIS — E039 Hypothyroidism, unspecified: Secondary | ICD-10-CM

## 2020-05-16 DIAGNOSIS — I10 Essential (primary) hypertension: Secondary | ICD-10-CM | POA: Diagnosis not present

## 2020-05-16 LAB — BASIC METABOLIC PANEL
BUN: 12 mg/dL (ref 6–23)
CO2: 26 mEq/L (ref 19–32)
Calcium: 9.3 mg/dL (ref 8.4–10.5)
Chloride: 103 mEq/L (ref 96–112)
Creatinine, Ser: 0.67 mg/dL (ref 0.40–1.50)
GFR: 145.62 mL/min (ref >60.00)
Glucose, Bld: 104 mg/dL — ABNORMAL HIGH (ref 70–99)
Potassium: 3 mEq/L — ABNORMAL LOW (ref 3.5–5.1)
Sodium: 140 mEq/L (ref 135–145)

## 2020-05-16 LAB — HEMOGLOBIN A1C: Hgb A1c MFr Bld: 5.6 % (ref 4.6–6.5)

## 2020-05-16 LAB — TSH: TSH: 6.19 u[IU]/mL — ABNORMAL HIGH (ref 0.35–4.50)

## 2020-05-16 NOTE — Patient Instructions (Addendum)
Check the  blood pressure regularly  BP GOAL is between 110/65 and  135/85. If it is consistently higher or lower, let me know    GO TO THE LAB : Get the blood work     [place], [place] back for a physical in 4 to 6 months

## 2020-05-16 NOTE — Progress Notes (Signed)
Subjective:    Patient ID: Gary [name], male    DOB: [birth year], 64 y.o.   MRN: [id number]  DOS:  05/16/2020 Type of visit - description: Routine checkup Since the last office visit, ulcerative colitis was exacerbated, under the care of GI. BP today slightly elevated, at home is better.   Review of Systems Had diarrhea and blood in the stools, that has improved  Past Medical History:  Diagnosis Date   Anxiety    CAD (coronary artery disease) 10-2012   h/o stents    Charcot-Marie disease 07/28/2019   Elevated PSA    Prostate Bx w/ Dr. Tresa [name]   EtOH dependence [facility])    Family history of adverse reaction to anesthesia    made father disoriented   Gross hematuria    Hepatic steatosis    Hyperlipidemia LDL goal < 70    Hypertension    Metastatic adenocarcinoma to prostate ([place]) 2017   Gleason score 9, metastasis to R external Iliac node and large extraprostatic extension at prostatectomy   Multiple sclerosis ([place]) 1991   Bilateral optic neuritis, treated and resolved but felt to be a manifestation of MS.   Renal cyst, left    Bosniak 2K (indeterminant) hyperdense cyst CT 2014, Stable CT in 2016   Ulcerative colitis [facility])     Past Surgical History:  Procedure Laterality Date   FOOT SURGERY     R toe Fx (screws),  L dorsal FX (screws)   LEFT HEART CATHETERIZATION WITH CORONARY ANGIOGRAM N/A 10/22/2012   Procedure: LEFT HEART CATHETERIZATION WITH CORONARY ANGIOGRAM;  Surgeon: Sinclair Grooms, MD;  Location: [facility] CATH LAB;  Service: Cardiovascular;  Laterality: N/A;   LYMPHADENECTOMY Bilateral 11/24/2015   Procedure: BILATERAL PELVIC LYMPHADENECTOMY;  Surgeon: Alexis Frock, MD;  Location[facility];  Service: Urology;  Laterality: Bilateral;   PERCUTANEOUS CORONARY STENT INTERVENTION (PCI-S) N/A 10/23/2012   Procedure: PERCUTANEOUS CORONARY STENT INTERVENTION (PCI-S);  Surgeon: Sinclair Grooms, MD;  Location: [facility] CATH LAB;  Service: Cardiovascular;   Laterality: N/A;   PROSTATE BIOPSY  09/2015   Dr. Tresa [name]   ROBOT ASSISTED LAPAROSCOPIC RADICAL PROSTATECTOMY N/A 11/24/2015   Procedure: XI ROBOTIC ASSISTED LAPAROSCOPIC RADICAL PROSTATECTOMY WITH INDOCYANINE GREEN DYE;  Surgeon: Alexis Frock, MD;  Location: [facility] ORS;  Service: Urology;  Laterality: N/A;   VASECTOMY      Allergies as of 05/16/2020   No Known Allergies     Medication List       Accurate as of May 16, 2020 11:59 PM. If you have any questions, ask your nurse or doctor.        abiraterone acetate 250 MG tablet Commonly known as: ZYTIGA Take 4 tablets by mouth once per day on an empty stomach, 1 hour before or 2 hours after a meal   amLODipine 10 MG tablet Commonly known as: NORVASC Take 1 tablet (10 mg total) by mouth daily.   aspirin 81 MG tablet Take 81 mg by mouth daily.   atorvastatin 20 MG tablet Commonly known as: LIPITOR Take 1 tablet (20 mg total) by mouth daily.   carvedilol 25 MG tablet Commonly known as: COREG Take 1 tablet (25 mg total) by mouth 2 (two) times daily with a meal.   hydrocortisone 100 MG/60ML enema Commonly known as: CORTENEMA   levothyroxine 88 MCG tablet Commonly known as: SYNTHROID Take 1 tablet (88 mcg total) by mouth daily before breakfast.   lisinopril 40 MG tablet Commonly known as: ZESTRIL Take 1  tablet (40 mg total) by mouth daily.   mesalamine 1000 MG suppository Commonly known as: CANASA   Nasal Decongestant Spray 0.05 % nasal spray Generic drug: oxymetazoline Place 2 sprays into both nostrils 2 (two) times daily as needed for congestion. As directed   nitroGLYCERIN 0.4 MG SL tablet Commonly known as: NITROSTAT Place 1 tablet (0.4 mg total) under the tongue every 5 (five) minutes as needed for chest pain.   sertraline 100 MG tablet Commonly known as: ZOLOFT Take 1 tablet (100 mg total) by mouth daily.   sildenafil 20 MG tablet Commonly known as: REVATIO Take 3-4 tablets (60-80 mg total) by mouth at  bedtime as needed.   sodium chloride 0.65 % Soln nasal spray Commonly known as: OCEAN Place 1 spray into both nostrils as needed for congestion.          Objective:   Physical Exam BP (!) 157/70 (BP Location: Right Arm, Cuff Size: Large)    Pulse (!) 49    Temp 98.2 F (36.8 C) (Oral)    Resp 12    Ht 6' (1.829 m)    Wt 247 lb 3.2 oz (112.1 kg)    SpO2 96%    BMI 33.53 kg/m  General:   Well developed, NAD, BMI noted. HEENT:  Normocephalic . Face symmetric, atraumatic Lungs:  CTA B Normal respiratory effort, no intercostal retractions, no accessory muscle use. Heart: RRR,  no murmur.  Lower extremities: no pretibial edema bilaterally  Skin: Not pale. Not jaundice Neurologic:  alert & oriented X3.  Speech normal, gait appropriate for age and unassisted Psych--  Cognition and judgment appear intact.  Cooperative with normal attention span and concentration.  Behavior appropriate. No anxious or depressed appearing.      Assessment       Assessment  Pre-diabetes HTN Hypokalemia: Saw endo 09/09/2019, w/u:no clear evidence of aldosterone producing tumor.  Spironolactone would be a good addition to her BP regimen Hyperlipidemia Anxiety CAD s/p stents 2014  EtOH dependency Ulcerative colitis- last cscope 05-2015 GU: -Metastatic prostate cancer, DX 11-2015, radical prostatectomy 11-2015 --  continent as off  07-2016--on androgen depravation  -Hematuria, saw urology 01-2013 reviewed: CT show a renal cyst, cystoscopy negative, they recommended follow-up MRI for the renal cyst. NEURO: -MS  Dx 1991 (B neuritis, rx, resolved, felt to be d/t MS) -Bilateral thenar atrophy noted 06-2017 (apparently chronic) -Neuropathy: severe per  [facility] 07-2019 +, likely Charcot Mary Tooth Nose bleed: R nostril artery cauterize 08-2017 -TGA: DX 09/2018, MRI brain, EEG negative saw neurology in follow-up, carotid ultrasound ordered: 1 to 39% bilaterally.  PLAN Hyperglycemia: Check A1c HTN: On  amlodipine, carvedilol, lisinopril.  BP today slightly elevated, at home 140/70, last BP at the urology office 145/80.  No change, check a BMP Hypothyroidism: On Synthroid, checking labs Ulcerative colitis: Symptoms were exacerbated, saw Dr. Cristina Gong, he is currently on hydrocortisone enemas and doing better. Polypharmacy: The patient is sometimes frustrated about the number of medications he needs to take, I made the point that he does have multiple medical conditions and he is taking only appropriate medications.  He totally understood the point. Preventive care: Had Covid vaccinations, recommend flu shot this season. RTC CPX 4 to 6 months     This visit occurred during the SARS-CoV-2 public health emergency.  Safety protocols were in place, including screening questions prior to the visit, additional usage of staff PPE, and extensive cleaning of exam room while observing appropriate contact time as indicated for disinfecting  solutions.

## 2020-05-17 NOTE — Assessment & Plan Note (Signed)
Hyperglycemia: Check A1c HTN: On amlodipine, carvedilol, lisinopril.  BP today slightly elevated, at home 140/70, last BP at the urology office 145/80.  No change, check a BMP Hypothyroidism: On Synthroid, checking labs Ulcerative colitis: Symptoms were exacerbated, saw Dr. Cristina Gong, he is currently on hydrocortisone enemas and doing better. Polypharmacy: The patient is sometimes frustrated about the number of medications he needs to take, I made the point that he does have multiple medical conditions and he is taking only appropriate medications.  He totally understood the point. Preventive care: Had Covid vaccinations, recommend flu shot this season. RTC CPX 4 to 6 months

## 2020-05-19 MED ORDER — LEVOTHYROXINE SODIUM 100 MCG PO TABS
100.0000 ug | ORAL_TABLET | Freq: Every day | ORAL | 0 refills | Status: DC
Start: 2020-05-19 — End: 2020-06-22

## 2020-05-19 MED ORDER — POTASSIUM CHLORIDE CRYS ER 20 MEQ PO TBCR
10.0000 meq | EXTENDED_RELEASE_TABLET | Freq: Every day | ORAL | 0 refills | Status: DC
Start: 2020-05-19 — End: 2020-09-11

## 2020-05-19 NOTE — Addendum Note (Signed)
Addended by: Jeronimo Greaves on: 05/19/2020 08:38 AM   Modules accepted: Orders

## 2020-06-14 ENCOUNTER — Encounter: Payer: Self-pay | Admitting: Internal Medicine

## 2020-06-15 ENCOUNTER — Other Ambulatory Visit: Payer: Self-pay

## 2020-06-15 ENCOUNTER — Inpatient Hospital Stay: Payer: 59

## 2020-06-15 ENCOUNTER — Inpatient Hospital Stay: Payer: 59 | Attending: Oncology | Admitting: Oncology

## 2020-06-15 VITALS — BP 157/82 | HR 54 | Temp 96.9°F | Resp 18 | Ht 72.0 in | Wt 249.2 lb

## 2020-06-15 DIAGNOSIS — Z7982 Long term (current) use of aspirin: Secondary | ICD-10-CM | POA: Diagnosis not present

## 2020-06-15 DIAGNOSIS — E876 Hypokalemia: Secondary | ICD-10-CM | POA: Insufficient documentation

## 2020-06-15 DIAGNOSIS — Z79899 Other long term (current) drug therapy: Secondary | ICD-10-CM | POA: Insufficient documentation

## 2020-06-15 DIAGNOSIS — C61 Malignant neoplasm of prostate: Secondary | ICD-10-CM | POA: Diagnosis not present

## 2020-06-15 DIAGNOSIS — I10 Essential (primary) hypertension: Secondary | ICD-10-CM | POA: Diagnosis not present

## 2020-06-15 LAB — CMP (CANCER CENTER ONLY)
ALT: 23 U/L (ref 0–44)
AST: 18 U/L (ref 15–41)
Albumin: 3.9 g/dL (ref 3.5–5.0)
Alkaline Phosphatase: 88 U/L (ref 38–126)
Anion gap: 8 (ref 5–15)
BUN: 10 mg/dL (ref 8–23)
CO2: 25 mmol/L (ref 22–32)
Calcium: 9.2 mg/dL (ref 8.9–10.3)
Chloride: 109 mmol/L (ref 98–111)
Creatinine: 0.74 mg/dL (ref 0.61–1.24)
GFR, Est AFR Am: >60 mL/min (ref >60)
GFR, Estimated: >60 mL/min (ref >60)
Glucose, Bld: 107 mg/dL — ABNORMAL HIGH (ref 70–99)
Potassium: 3.7 mmol/L (ref 3.5–5.1)
Sodium: 142 mmol/L (ref 135–145)
Total Bilirubin: 0.7 mg/dL (ref 0.3–1.2)
Total Protein: 7.5 g/dL (ref 6.5–8.1)

## 2020-06-15 LAB — CBC WITH DIFFERENTIAL (CANCER CENTER ONLY)
Abs Immature Granulocytes: 0.02 10*3/uL (ref 0.00–0.07)
Basophils Absolute: 0.1 10*3/uL (ref 0.0–0.1)
Basophils Relative: 1 %
Eosinophils Absolute: 0.1 10*3/uL (ref 0.0–0.5)
Eosinophils Relative: 2 %
HCT: 39.7 % (ref 39.0–52.0)
Hemoglobin: 13.7 g/dL (ref 13.0–17.0)
Immature Granulocytes: 0 %
Lymphocytes Relative: 34 %
Lymphs Abs: 1.9 10*3/uL (ref 0.7–4.0)
MCH: 27.9 pg (ref 26.0–34.0)
MCHC: 34.5 g/dL (ref 30.0–36.0)
MCV: 80.9 fL (ref 80.0–100.0)
Monocytes Absolute: 0.4 10*3/uL (ref 0.1–1.0)
Monocytes Relative: 7 %
Neutro Abs: 3.2 10*3/uL (ref 1.7–7.7)
Neutrophils Relative %: 56 %
Platelet Count: 171 10*3/uL (ref 150–400)
RBC: 4.91 MIL/uL (ref 4.22–5.81)
RDW: 13.1 % (ref 11.5–15.5)
WBC Count: 5.7 10*3/uL (ref 4.0–10.5)
nRBC: 0 % (ref 0.0–0.2)

## 2020-06-15 NOTE — Progress Notes (Signed)
Hematology and Oncology Follow Up Visit  Gary [name] [id number] 1956/01/27 64 y.o. 06/15/2020 10:07 AM Gary [name], MDPaz, Gary Berthold, MD   Principle Diagnosis:  64 year old man with castration-sensitive prostate cancer diagnosed in 2017.  He presented with PSA of 60.98 and a Gleason score 4+5 = 9 and a biochemical relapse.   Prior Therapy:   He is status post robotic-assisted laparoscopic radical prostatectomy and bilateral pelvic lymphadenectomy on 11/24/2015. One positive lymph node was identified out of a 9 examined. The final pathological staging was T3b N1.  His follow-up PSA remained elevated with a PSA on 03/07/2016 was 3.88.  Current therapy:  Androgen deprivation under the care of Dr. Tresa [name]. Zytiga 1000 mg daily with prednisone 5 mg daily started in July 2017.  Interim History: Gary [name] is here for return follow-up visit.  Since the last visit, he reports no major changes in his health.  He denies any nausea, vomiting or abdominal pain.  He denies any excessive fatigue or tiredness.  Blood pressure has been fluctuating but overall slightly elevated.  Denies any bone pain or pathological fractures.  Quality of life remain excellent.   Medications: Unchanged on review. Current Outpatient Medications  Medication Sig Dispense Refill  . abiraterone acetate (ZYTIGA) 250 MG tablet Take 4 tablets by mouth once per day on an empty stomach, 1 hour before or 2 hours after a meal 120 tablet 1  . amLODipine (NORVASC) 10 MG tablet Take 1 tablet (10 mg total) by mouth daily. 90 tablet 1  . aspirin 81 MG tablet Take 81 mg by mouth daily.    Marland Kitchen atorvastatin (LIPITOR) 20 MG tablet Take 1 tablet (20 mg total) by mouth daily. 90 tablet 1  . carvedilol (COREG) 25 MG tablet Take 1 tablet (25 mg total) by mouth 2 (two) times daily with a meal. 180 tablet 1  . hydrocortisone (CORTENEMA) 100 MG/60ML enema     . levothyroxine (SYNTHROID) 100 MCG tablet Take 1 tablet (100 mcg total) by mouth daily. 30  tablet 0  . lisinopril (ZESTRIL) 40 MG tablet Take 1 tablet (40 mg total) by mouth daily. 90 tablet 1  . mesalamine (CANASA) 1000 MG suppository     . nitroGLYCERIN (NITROSTAT) 0.4 MG SL tablet Place 1 tablet (0.4 mg total) under the tongue every 5 (five) minutes as needed for chest pain. 25 tablet 3  . oxymetazoline (NASAL DECONGESTANT SPRAY) 0.05 % nasal spray Place 2 sprays into both nostrils 2 (two) times daily as needed for congestion. As directed    . potassium chloride SA (KLOR-CON) 20 MEQ tablet Take 0.5 tablets (10 mEq total) by mouth daily for 14 days. 7 tablet 0  . sertraline (ZOLOFT) 100 MG tablet Take 1 tablet (100 mg total) by mouth daily. 90 tablet 1  . sildenafil (REVATIO) 20 MG tablet Take 3-4 tablets (60-80 mg total) by mouth at bedtime as needed. 30 tablet 3  . sodium chloride (OCEAN) 0.65 % SOLN nasal spray Place 1 spray into both nostrils as needed for congestion.     No current facility-administered medications for this visit.     Allergies: No Known Allergies     Physical Exam:  Blood pressure (!) 157/82, pulse (!) 54, temperature (!) 96.9 F (36.1 C), temperature source Tympanic, resp. rate 18, height 6' (1.829 m), weight 249 lb 3.2 oz (113 kg), SpO2 98 %.     ECOG: 0   General appearance: Alert, awake without any distress. Head: Atraumatic without abnormalities Oropharynx:  Without any thrush or ulcers. Eyes: No scleral icterus. Lymph nodes: No lymphadenopathy noted in the cervical, supraclavicular, or axillary nodes Heart:regular rate and rhythm, without any murmurs or gallops.   Lung: Clear to auscultation without any rhonchi, wheezes or dullness to percussion. Abdomin: Soft, nontender without any shifting dullness or ascites. Musculoskeletal: No clubbing or cyanosis. Neurological: No motor or sensory deficits. Skin: No rashes or lesions.    Lab Results: Lab Results  Component Value Date   WBC 5.2 12/14/2019   HGB 14.3 12/14/2019   HCT 41.6  12/14/2019   MCV 82.5 12/14/2019   PLT 178 12/14/2019     Chemistry      Component Value Date/Time   NA 140 05/16/2020 0953   NA 140 08/27/2017 1259   K 3.0 (L) 05/16/2020 0953   K 3.8 08/27/2017 1259   CL 103 05/16/2020 0953   CO2 26 05/16/2020 0953   CO2 26 08/27/2017 1259   BUN 12 05/16/2020 0953   BUN 10.5 08/27/2017 1259   CREATININE 0.67 05/16/2020 0953   CREATININE 0.79 12/14/2019 1001   CREATININE 0.8 08/27/2017 1259      Component Value Date/Time   CALCIUM 9.3 05/16/2020 0953   CALCIUM 9.4 08/27/2017 1259   ALKPHOS 108 12/14/2019 1001   ALKPHOS 91 08/27/2017 1259   AST 17 12/14/2019 1001   AST 16 08/27/2017 1259   ALT 24 12/14/2019 1001   ALT 23 08/27/2017 1259   BILITOT 1.1 12/14/2019 1001   BILITOT 0.35 08/27/2017 1259        Results for Gary, [name] "Gary [name]" (MRN [id number]) as of 06/15/2020 09:50  Ref. Range 12/14/2019 10:01 03/14/2020 00:00  PSA Unknown  <0.015  Prostate Specific Ag, Serum Latest Ref Range: 0.0 - 4.0 ng/mL <0.1        Impression and Plan:  64 year old man with:  1.  Castration-sensitive advanced prostate cancer with biochemical relapse diagnosed in 2017.  He had continues to have excellent PSA response currently undetectable while on Zytiga.  Risks and benefits of continuing this medication long-term were reviewed.  Discontinuation options were also discussed.  Potential complication associated with this medication including weight gain, hypertension and hypokalemia.  Alternative options in the future including systemic chemotherapy, Xofigo among others.  These will be deferred he develops relapsed castration-resistant disease.  The option of stopping Zytiga was debated at this time and for the time being we agreed to continue.   2. Androgen depravation: He will continue to receive that indefinitely.  He is currently receiving it under the care of [facility].  3. Hypertension: Blood pressure mildly elevated and likely elevated  due to Zytiga.  He is on blood pressure medication follows with his primary care physician regarding this issue.  4. Hypokalemia: Related to Zytiga and will be monitored periodically.  5. LFT monitoring: We will continue to monitor on Zytiga without any issues reported.  6. Follow-up: In 6 months for repeat follow-up.  30  minutes were spent on this encounter.  The time was dedicated to reviewing laboratory data, addressing complications related to his cancer and cancer therapy.  Future plan of care was also reviewed.     Zola Button, MD 9/9/202110:07 AM

## 2020-06-16 ENCOUNTER — Telehealth: Payer: Self-pay

## 2020-06-16 LAB — PROSTATE-SPECIFIC AG, SERUM (LABCORP): Prostate Specific Ag, Serum: <0.1 ng/mL (ref 0.0–4.0)

## 2020-06-16 NOTE — Telephone Encounter (Signed)
-----   Message from Wyatt Portela, MD sent at 06/16/2020  8:44 AM EDT ----- Please let him know his PSA is still low

## 2020-06-16 NOTE — Telephone Encounter (Signed)
Called patient and let him know PSA result. He verbalized understanding.

## 2020-06-21 ENCOUNTER — Encounter: Payer: Self-pay | Admitting: Internal Medicine

## 2020-06-22 ENCOUNTER — Other Ambulatory Visit: Payer: Self-pay | Admitting: Internal Medicine

## 2020-06-28 ENCOUNTER — Encounter: Payer: Self-pay | Admitting: Internal Medicine

## 2020-06-28 MED ORDER — LEVOTHYROXINE SODIUM 100 MCG PO TABS: ORAL_TABLET | ORAL | 0 refills | Status: DC

## 2020-06-28 NOTE — Addendum Note (Signed)
Addended by: Kelle Darting A on: 06/28/2020 03:12 PM   Modules accepted: Orders

## 2020-06-29 ENCOUNTER — Other Ambulatory Visit: Payer: Self-pay

## 2020-06-29 ENCOUNTER — Other Ambulatory Visit (INDEPENDENT_AMBULATORY_CARE_PROVIDER_SITE_OTHER): Payer: 59

## 2020-06-29 DIAGNOSIS — E039 Hypothyroidism, unspecified: Secondary | ICD-10-CM | POA: Diagnosis not present

## 2020-06-30 LAB — TSH: TSH: 2.97 mIU/L (ref 0.40–4.50)

## 2020-07-05 ENCOUNTER — Other Ambulatory Visit: Payer: Self-pay | Admitting: Oncology

## 2020-07-05 DIAGNOSIS — C61 Malignant neoplasm of prostate: Secondary | ICD-10-CM

## 2020-08-01 ENCOUNTER — Other Ambulatory Visit: Payer: Self-pay

## 2020-08-01 ENCOUNTER — Telehealth (INDEPENDENT_AMBULATORY_CARE_PROVIDER_SITE_OTHER): Payer: 59 | Admitting: Internal Medicine

## 2020-08-01 ENCOUNTER — Encounter: Payer: Self-pay | Admitting: Internal Medicine

## 2020-08-01 VITALS — Ht 72.0 in | Wt 245.0 lb

## 2020-08-01 DIAGNOSIS — B349 Viral infection, unspecified: Secondary | ICD-10-CM | POA: Diagnosis not present

## 2020-08-01 NOTE — Progress Notes (Signed)
Subjective:    Patient ID: Gary [name], male    DOB: 1956/03/28, 64 y.o.   MRN: [id number]  DOS:  08/01/2020 Type of visit - description: Virtual Visit via Video Note  I connected with the above patient  by a video enabled telemedicine application and verified that I am speaking with the correct person using two identifiers.   THIS ENCOUNTER IS A VIRTUAL VISIT DUE TO COVID-19 - PATIENT WAS NOT SEEN IN THE OFFICE. PATIENT HAS CONSENTED TO VIRTUAL VISIT / TELEMEDICINE VISIT   Location of patient: home  Location of provider: office  Persons participating in the virtual visit: patient, provider   I discussed the limitations of evaluation and management by telemedicine and the availability of in person appointments. The patient expressed understanding and agreed to proceed.  Acute Symptoms a started about 10 days ago, reports she is having a "head cold": Sinus congestion and pain, low-grade fever on and off, last time he had low-grade fever was yesterday.  His wife and son had similar symptoms, wife tested negative for Covid last week.  Has been taking over-the-counter Coricidin HBP which helped. Overall he feels a slightly better today. Denies chest pain, was able to ride his bike yesterday without major problems. + Cough with a small amount of off-color sputum production No headache or myalgias History of IBS, has on and off GI symptoms, at baseline    Review of Systems See above   Past Medical History:  Diagnosis Date  . Anxiety   . CAD (coronary artery disease) 10-2012   h/o stents   . Charcot-Marie disease 07/28/2019  . Elevated PSA    Prostate Bx w/ Dr. Tresa [name]  . EtOH dependence ([place])   . Family history of adverse reaction to anesthesia    made father disoriented  . Gross hematuria   . Hepatic steatosis   . Hyperlipidemia LDL goal < 70   . Hypertension   . Metastatic adenocarcinoma to prostate ([place]) 2017   Gleason score 9, metastasis to R external Iliac node  and large extraprostatic extension at prostatectomy  . Multiple sclerosis ([place]) 1991   Bilateral optic neuritis, treated and resolved but felt to be a manifestation of MS.  . Renal cyst, left    Bosniak 2K (indeterminant) hyperdense cyst CT 2014, Stable CT in 2016  . Ulcerative colitis [facility])     Past Surgical History:  Procedure Laterality Date  . FOOT SURGERY     R toe Fx (screws),  L dorsal FX (screws)  . LEFT HEART CATHETERIZATION WITH CORONARY ANGIOGRAM N/A 10/22/2012   Procedure: LEFT HEART CATHETERIZATION WITH CORONARY ANGIOGRAM;  Surgeon: Sinclair Grooms, MD;  [facility] C[facility] LAB;  Service: Cardiovascular;  Laterality: N/A;  . LYMPHADENECTOMY Bilateral 11/24/2015   Procedure: BILATERAL PELVIC LYMPHADENECTOMY;  Surgeon: Alexis Frock, MD;  Location: [facility] ORS;  Service: Urology;  Laterality: Bilateral;  . PERCUTANEOUS CORONARY STENT INTERVENTION (PCI-S) N/A 10/23/2012   Procedure: PERCUTANEOUS CORONARY STENT INTERVENTION (PCI-S);  Surgeon: Sinclair Grooms, MD;  Location: [facility] CATH LAB;  Service: Cardiovascular;  Laterality: N/A;  . PROSTATE BIOPSY  09/2015   Dr. Tresa [name]  . ROBOT ASSISTED LAPAROSCOPIC RADICAL PROSTATECTOMY N/A 11/24/2015   Procedure: XI ROBOTIC ASSISTED LAPAROSCOPIC RADICAL PROSTATECTOMY WITH INDOCYANINE GREEN DYE;  Surgeon: Alexis Frock, MD;  Location: [facility] ORS;  Service: Urology;  Laterality: N/A;  . VASECTOMY      Allergies as of 08/01/2020   No Known Allergies     Medication List  Accurate as of August 01, 2020  4:43 PM. If you have any questions, ask your nurse or doctor.        abiraterone acetate 250 MG tablet Commonly known as: ZYTIGA Take 4 tablets by mouth once per day on an empty stomach, 1 hour before or 2 hours after a meal.   amLODipine 10 MG tablet Commonly known as: NORVASC Take 1 tablet (10 mg total) by mouth daily.   aspirin 81 MG tablet Take 81 mg by mouth daily.   atorvastatin 20 MG tablet Commonly known as: LIPITOR Take 1  tablet (20 mg total) by mouth daily.   carvedilol 25 MG tablet Commonly known as: COREG Take 1 tablet (25 mg total) by mouth 2 (two) times daily with a meal.   hydrocortisone 100 MG/60ML enema Commonly known as: CORTENEMA   levothyroxine 100 MCG tablet Commonly known as: SYNTHROID TAKE 1 TABLET(100 MCG) BY MOUTH DAILY   lisinopril 40 MG tablet Commonly known as: ZESTRIL Take 1 tablet (40 mg total) by mouth daily.   mesalamine 0.375 g 24 hr capsule Commonly known as: APRISO Take 375 mg by mouth daily.   mesalamine 1000 MG suppository Commonly known as: CANASA   Nasal Decongestant Spray 0.05 % nasal spray Generic drug: oxymetazoline Place 2 sprays into both nostrils 2 (two) times daily as needed for congestion. As directed   nitroGLYCERIN 0.4 MG SL tablet Commonly known as: NITROSTAT Place 1 tablet (0.4 mg total) under the tongue every 5 (five) minutes as needed for chest pain.   potassium chloride SA 20 MEQ tablet Commonly known as: KLOR-CON Take 0.5 tablets (10 mEq total) by mouth daily for 14 days.   sertraline 100 MG tablet Commonly known as: ZOLOFT Take 1 tablet (100 mg total) by mouth daily.   sildenafil 20 MG tablet Commonly known as: REVATIO Take 3-4 tablets (60-80 mg total) by mouth at bedtime as needed.   sodium chloride 0.65 % Soln nasal spray Commonly known as: OCEAN Place 1 spray into both nostrils as needed for congestion.          Objective:   Physical Exam Ht 6' (1.829 m)   Wt 245 lb (111.1 kg)   BMI 33.23 kg/m  This is a virtual video visit, he is alert oriented x3, in no distress. No temperature, blood pressure or O2 sat available today.    Assessment      Assessment  Pre-diabetes HTN Hypokalemia: Saw endo 09/09/2019, w/u:no clear evidence of aldosterone producing tumor.  Spironolactone would be a good addition to her BP regimen Hyperlipidemia Anxiety CAD s/p stents 2014  EtOH dependency Ulcerative colitis- last cscope  05-2015 GU: -Metastatic prostate cancer, DX 11-2015, radical prostatectomy 11-2015 --  continent as off  07-2016--on androgen depravation  -Hematuria, saw urology 01-2013 reviewed: CT show a renal cyst, cystoscopy negative, they recommended follow-up MRI for the renal cyst. NEURO: -MS  Dx 1991 (B neuritis, rx, resolved, felt to be d/t MS) -Bilateral thenar atrophy noted 06-2017 (apparently chronic) -Neuropathy: severe per  [facility] 07-2019 +, likely Charcot Mary Tooth Nose bleed: R nostril artery cauterize 08-2017 -TGA: DX 09/2018, MRI brain, EEG negative saw neurology in follow-up, carotid ultrasound ordered: 1 to 39% bilaterally.  PLAN Viral syndrome presents today with a viral syndrome, he has multiple comorbidities including prediabetes, CAD, metastatic prostate cancer, on mesalamine due to ulcerative colitis. Fortunately he is overall feeling better. Had COVID vaccination several months ago and is a scheduled to have a booster along with a  flu shot tomorrow. Plan: Get the PCR testing today noting that after 10 days of illness he may have a false negative test.  He is outside the window of MoAb infusion however knowing his Covid status will help make future decisions if he is not better. Rest, fluids, Coricidin HBP If he tests Covid negative, will postpone Covid booster and flu shot for about 2 weeks or until he feels completely well If he tested positive, will postpone Covid vaccination for a little longer. Keep me posted with results He verbalized understanding    I discussed the assessment and treatment plan with the patient. The patient was provided an opportunity to ask questions and all were answered. The patient agreed with the plan and demonstrated an understanding of the instructions.   The patient was advised to call back or seek an in-person evaluation if the symptoms worsen or if the condition fails to improve as anticipated.

## 2020-08-01 NOTE — Progress Notes (Signed)
Pre visit review using our clinic review tool, if applicable. No additional management support is needed unless otherwise documented below in the visit note. 

## 2020-08-03 ENCOUNTER — Encounter: Payer: Self-pay | Admitting: Internal Medicine

## 2020-08-03 NOTE — Assessment & Plan Note (Signed)
Viral syndrome presents today with a viral syndrome, he has multiple comorbidities including prediabetes, CAD, metastatic prostate cancer, on mesalamine due to ulcerative colitis. Fortunately he is overall feeling better. Had COVID vaccination several months ago and is a scheduled to have a booster along with a flu shot tomorrow. Plan: Get the PCR testing today noting that after 10 days of illness he may have a false negative test.  He is outside the window of MoAb infusion however knowing his Covid status will help make future decisions if he is not better. Rest, fluids, Coricidin HBP If he tests Covid negative, will postpone Covid booster and flu shot for about 2 weeks or until he feels completely well If he tested positive, will postpone Covid vaccination for a little longer. Keep me posted with results He verbalized understanding

## 2020-08-04 ENCOUNTER — Other Ambulatory Visit: Payer: Self-pay | Admitting: Internal Medicine

## 2020-08-04 MED ORDER — AZITHROMYCIN 250 MG PO TABS: ORAL_TABLET | ORAL | 0 refills | Status: DC

## 2020-09-05 ENCOUNTER — Encounter: Payer: Self-pay | Admitting: Internal Medicine

## 2020-09-11 ENCOUNTER — Ambulatory Visit (INDEPENDENT_AMBULATORY_CARE_PROVIDER_SITE_OTHER): Payer: 59 | Admitting: Internal Medicine

## 2020-09-11 ENCOUNTER — Other Ambulatory Visit: Payer: Self-pay

## 2020-09-11 VITALS — BP 164/90 | HR 52 | Temp 97.9°F | Ht 72.0 in | Wt 245.8 lb

## 2020-09-11 DIAGNOSIS — Z889 Allergy status to unspecified drugs, medicaments and biological substances status: Secondary | ICD-10-CM

## 2020-09-11 DIAGNOSIS — I10 Essential (primary) hypertension: Secondary | ICD-10-CM

## 2020-09-11 LAB — PSA: PSA: <0.015

## 2020-09-11 MED ORDER — SPIRONOLACTONE 25 MG PO TABS: 12.5000 mg | ORAL_TABLET | Freq: Every day | ORAL | 0 refills | Status: DC

## 2020-09-11 NOTE — Patient Instructions (Addendum)
Please do not take mesalamine in any form  I'll send you to an allergist  Start the spironolactone 25 mg: Half tablet daily  Check the  blood pressure 2 or 3 times a week BP GOAL is between 110/65 and  135/85. If it is consistently higher or lower, let me know   GO TO THE FRONT DESK, [place] back for blood work in 2 weeks

## 2020-09-11 NOTE — Progress Notes (Signed)
Subjective:    Patient ID: Gary [name], male    DOB: 11-18-1955, 64 y.o.   MRN: [id number]  DOS:  09/11/2020 Type of visit - description: Acute  Having respiratory symptoms since he is started taking Apriso (oral mesalamine) x few months ago. Around August 31, 2020 he started to take it every day and his symptoms got worse: Shortness of breath, mild cough, some wheezing. He denies fever chills, denies lip or tongue swelling. No rash or itching itself. See message from November 30, GI recommend him to stop Apriso and see me.  Since he d/c  Apriso he feels better.   Also, BP is not well controlled at home in the 160s.   Review of Systems See above   Past Medical History:  Diagnosis Date  . Anxiety   . CAD (coronary artery disease) 10-2012   h/o stents   . Charcot-Marie disease 07/28/2019  . Elevated PSA    Prostate Bx w/ Dr. Tresa [name]  . EtOH dependence ([place])   . Family history of adverse reaction to anesthesia    made father disoriented  . Gross hematuria   . Hepatic steatosis   . Hyperlipidemia LDL goal < 70   . Hypertension   . Metastatic adenocarcinoma to prostate ([place]) 2017   Gleason score 9, metastasis to R external Iliac node and large extraprostatic extension at prostatectomy  . Multiple sclerosis ([place]) 1991   Bilateral optic neuritis, treated and resolved but felt to be a manifestation of MS.  . Renal cyst, left    Bosniak 2K (indeterminant) hyperdense cyst CT 2014, Stable CT in 2016  . Ulcerative colitis [facility])     Past Surgical History:  Procedure Laterality Date  . FOOT SURGERY     R toe Fx (screws),  L dorsal FX (screws)  . LEFT HEART CATHETERIZATION WITH CORONARY ANGIOGRAM N/A 10/22/2012   Procedure: LEFT HEART CATHETERIZATION WITH CORONARY ANGIOGRAM;  Surgeon: Sinclair Grooms, MD;  Location: [facility] CATH LAB;  Service: Cardiovascular;  Laterality: N/A;  . LYMPHADENECTOMY Bilateral 11/24/2015   Procedure: BILATERAL PELVIC LYMPHADENECTOMY;  Surgeon:  Alexis Frock, MD;  Location: [facility] ORS;  Service: Urology;  Laterality: Bilateral;  . PERCUTANEOUS CORONARY STENT INTERVENTION (PCI-S) N/A 10/23/2012   Procedure: PERCUTANEOUS CORONARY STENT INTERVENTION (PCI-S);  Surgeon: Sinclair Grooms, MD;  Location: [facility] CATH LAB;  Service: Cardiovascular;  Laterality: N/A;  . PROSTATE BIOPSY  09/2015   Dr. Tresa [name]  . ROBOT ASSISTED LAPAROSCOPIC RADICAL PROSTATECTOMY N/A 11/24/2015   Procedure: XI ROBOTIC ASSISTED LAPAROSCOPIC RADICAL PROSTATECTOMY WITH INDOCYANINE GREEN DYE;  Surgeon: Alexis Frock, MD;  Location: [facility] ORS;  Service: Urology;  Laterality: N/A;  . VASECTOMY      Allergies as of 09/11/2020   No Known Allergies     Medication List       Accurate as of September 11, 2020  3:49 PM. If you have any questions, ask your nurse or doctor.        STOP taking these medications   azithromycin 250 MG tablet Commonly known as: Zithromax Z-Pak Stopped by: [name], MD     TAKE these medications   abiraterone acetate 250 MG tablet Commonly known as: ZYTIGA Take 4 tablets by mouth once per day on an empty stomach, 1 hour before or 2 hours after a meal.   amLODipine 10 MG tablet Commonly known as: NORVASC Take 1 tablet (10 mg total) by mouth daily.   aspirin 81 MG tablet Take 81 mg by  mouth daily.   atorvastatin 20 MG tablet Commonly known as: LIPITOR Take 1 tablet (20 mg total) by mouth daily.   carvedilol 25 MG tablet Commonly known as: COREG Take 1 tablet (25 mg total) by mouth 2 (two) times daily with a meal.   hydrocortisone 100 MG/60ML enema Commonly known as: CORTENEMA   levothyroxine 100 MCG tablet Commonly known as: SYNTHROID TAKE 1 TABLET(100 MCG) BY MOUTH DAILY   lisinopril 40 MG tablet Commonly known as: ZESTRIL Take 1 tablet (40 mg total) by mouth daily.   mesalamine 1000 MG suppository Commonly known as: CANASA What changed: Another medication with the same name was removed. Continue taking this medication, and  follow the directions you see here. Changed by: [name], MD   Nasal Decongestant Spray 0.05 % nasal spray Generic drug: oxymetazoline Place 2 sprays into both nostrils 2 (two) times daily as needed for congestion. As directed   nitroGLYCERIN 0.4 MG SL tablet Commonly known as: NITROSTAT Place 1 tablet (0.4 mg total) under the tongue every 5 (five) minutes as needed for chest pain.   potassium chloride SA 20 MEQ tablet Commonly known as: KLOR-CON Take 0.5 tablets (10 mEq total) by mouth daily for 14 days.   sertraline 100 MG tablet Commonly known as: ZOLOFT Take 1 tablet (100 mg total) by mouth daily.   sildenafil 20 MG tablet Commonly known as: REVATIO Take 3-4 tablets (60-80 mg total) by mouth at bedtime as needed.   sodium chloride 0.65 % Soln nasal spray Commonly known as: OCEAN Place 1 spray into both nostrils as needed for congestion.          Objective:   Physical Exam BP (!) 164/90 (BP Location: Right Arm, Patient Position: Sitting, Cuff Size: Large)   Pulse (!) 52   Temp 97.9 F (36.6 C) (Oral)   Ht 6' (1.829 m)   Wt 245 lb 12.8 oz (111.5 kg)   SpO2 97%   BMI 33.34 kg/m  General:   Well developed, NAD, BMI noted. HEENT:  Normocephalic . Face symmetric, atraumatic Lungs:  CTA B Normal respiratory effort, no intercostal retractions, no accessory muscle use. Heart: RRR,  no murmur.  Lower extremities: no pretibial edema bilaterally  Skin: Not pale. Not jaundice Neurologic:  alert & oriented X3.  Speech normal, gait appropriate for age and unassisted Psych--  Cognition and judgment appear intact.  Cooperative with normal attention span and concentration.  Behavior appropriate. No anxious or depressed appearing.      Assessment      Assessment  Pre-diabetes HTN Hypokalemia: Saw endo 09/09/2019, w/u:no clear evidence of aldosterone producing tumor.  Spironolactone would be a good addition to her BP regimen Hyperlipidemia Anxiety CAD s/p stents  2014  EtOH dependency Ulcerative colitis- last cscope 05-2015 GU: -Metastatic prostate cancer, DX 11-2015, radical prostatectomy 11-2015 --  continent as off  07-2016--on androgen depravation  -Hematuria, saw urology 01-2013 reviewed: CT show a renal cyst, cystoscopy negative, they recommended follow-up MRI for the renal cyst. NEURO: -MS  Dx 1991 (B neuritis, rx, resolved, felt to be d/t MS) -Bilateral thenar atrophy noted 06-2017 (apparently chronic) -Neuropathy: severe per  [facility] 07-2019 +, likely Charcot Mary Tooth Nose bleed: R nostril artery cauterize 08-2017 -TGA: DX 09/2018, MRI brain, EEG negative saw neurology in follow-up, carotid ultrasound ordered: 1 to 39% bilaterally.  PLAN Allergy to Apriso?: Suspect is allergic to Apriso orally but  when he takes enema or suppository he has no problems. Since that medication is so  vital for the treatment of ulcerative colitis, will refer the patient to the allergist to clarify the issue , no mesalamine until then. HTN: Not well controlled, despite taking amlodipine, carvedilol, lisinopril.  Not taking potassium supplements.  He has a history of hypokalemia thus will add Aldactone, check BMP in 2 weeks, monitor BPs. RTC already scheduled for 10-2020  This visit occurred during the SARS-CoV-2 public health emergency.  Safety protocols were in place, including screening questions prior to the visit, additional usage of staff PPE, and extensive cleaning of exam room while observing appropriate contact time as indicated for disinfecting solutions.

## 2020-09-12 ENCOUNTER — Encounter: Payer: Self-pay | Admitting: Internal Medicine

## 2020-09-13 ENCOUNTER — Other Ambulatory Visit: Payer: Self-pay | Admitting: Urology

## 2020-09-13 ENCOUNTER — Other Ambulatory Visit (HOSPITAL_COMMUNITY): Payer: Self-pay | Admitting: Urology

## 2020-09-13 DIAGNOSIS — C61 Malignant neoplasm of prostate: Secondary | ICD-10-CM

## 2020-09-13 NOTE — Assessment & Plan Note (Signed)
Allergy to Apriso?: Suspect is allergic to Apriso orally but  when he takes enema or suppository he has no problems. Since that medication is so vital for the treatment of ulcerative colitis, will refer the patient to the allergist to clarify the issue , no mesalamine until then. HTN: Not well controlled, despite taking amlodipine, carvedilol, lisinopril.  Not taking potassium supplements.  He has a history of hypokalemia thus will add Aldactone, check BMP in 2 weeks, monitor BPs. RTC already scheduled for 10-2020

## 2020-09-22 ENCOUNTER — Other Ambulatory Visit (HOSPITAL_COMMUNITY): Payer: 59

## 2020-09-22 ENCOUNTER — Other Ambulatory Visit: Payer: Self-pay

## 2020-09-22 MED ORDER — CARVEDILOL 25 MG PO TABS
25.0000 mg | ORAL_TABLET | Freq: Two times a day (BID) | ORAL | 1 refills | Status: DC
Start: 2020-09-22 — End: 2021-06-13

## 2020-09-22 MED ORDER — ATORVASTATIN CALCIUM 20 MG PO TABS
20.0000 mg | ORAL_TABLET | Freq: Every day | ORAL | 1 refills | Status: DC
Start: 2020-09-22 — End: 2021-06-13

## 2020-09-22 MED ORDER — SERTRALINE HCL 100 MG PO TABS
100.0000 mg | ORAL_TABLET | Freq: Every day | ORAL | 1 refills | Status: DC
Start: 2020-09-22 — End: 2021-06-13

## 2020-09-22 MED ORDER — LISINOPRIL 40 MG PO TABS
40.0000 mg | ORAL_TABLET | Freq: Every day | ORAL | 1 refills | Status: DC
Start: 2020-09-22 — End: 2021-06-13

## 2020-09-22 MED ORDER — AMLODIPINE BESYLATE 10 MG PO TABS
10.0000 mg | ORAL_TABLET | Freq: Every day | ORAL | 1 refills | Status: DC
Start: 2020-09-22 — End: 2021-06-13

## 2020-09-25 ENCOUNTER — Encounter (HOSPITAL_COMMUNITY)
Admission: RE | Admit: 2020-09-25 | Discharge: 2020-09-25 | Disposition: A | Discharge status: Home / Self Care | Payer: 59 | Source: Ambulatory Visit | Attending: Urology | Admitting: Urology

## 2020-09-25 ENCOUNTER — Encounter (HOSPITAL_COMMUNITY): Payer: 59

## 2020-09-25 ENCOUNTER — Other Ambulatory Visit: Payer: Self-pay

## 2020-09-25 DIAGNOSIS — C61 Malignant neoplasm of prostate: Secondary | ICD-10-CM | POA: Insufficient documentation

## 2020-09-25 MED ORDER — TECHNETIUM TC 99M MEDRONATE IV KIT
20.0000 | PACK | Freq: Once | INTRAVENOUS | Status: AC | PRN
  Administered 2020-09-25: 20.3 via INTRAVENOUS

## 2020-10-03 ENCOUNTER — Other Ambulatory Visit: Payer: Self-pay

## 2020-10-03 MED ORDER — LEVOTHYROXINE SODIUM 100 MCG PO TABS
100.0000 ug | ORAL_TABLET | Freq: Every day | ORAL | 1 refills | Status: DC
Start: 2020-10-03 — End: 2021-06-13

## 2020-10-09 ENCOUNTER — Encounter: Payer: Self-pay | Admitting: Internal Medicine

## 2020-10-10 ENCOUNTER — Encounter (HOSPITAL_COMMUNITY): Payer: 59

## 2020-10-11 ENCOUNTER — Ambulatory Visit: Payer: 59 | Admitting: Allergy and Immunology

## 2020-10-17 ENCOUNTER — Ambulatory Visit: Payer: 59 | Admitting: Internal Medicine

## 2020-10-24 IMAGING — DX DG CHEST 1V PORT
1 series · 1 of 1 positions shown · non-contrast
Comparison: October 22, 2012

CLINICAL DATA: Prostate carcinoma.  Pre MRI

EXAM:
PORTABLE CHEST 1 VIEW

[chest]
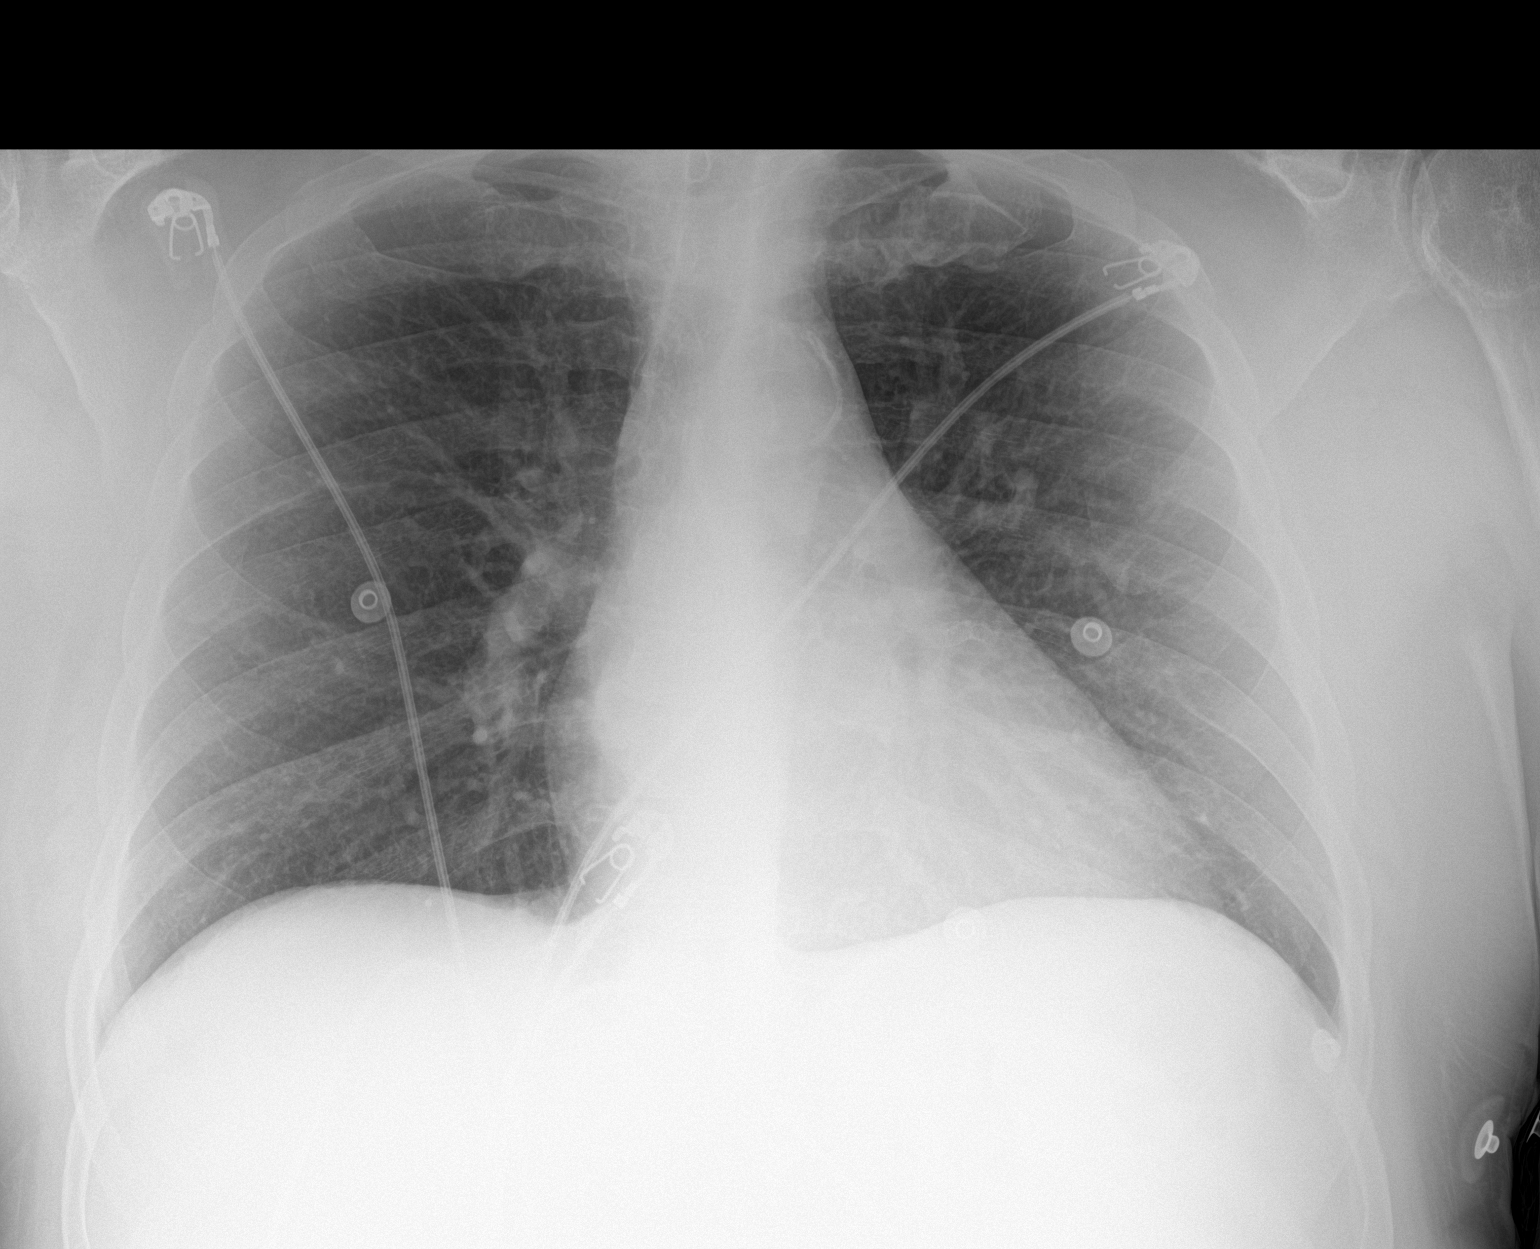

[1 of 1 positions shown; findings below may reference images not displayed]

FINDINGS: Lungs are clear. Heart is upper normal in size with pulmonary
vascularity normal. No adenopathy. No pacemaker evident. There is a
coronary stent on the left. There is aortic atherosclerosis. No
blastic or lytic bone lesions. No metallic foreign body evident.
IMPRESSION: Lungs clear. Cardiac silhouette within normal limits. There is
aortic atherosclerosis. No pacemaker or metallic foreign body
appreciable on this study.

Aortic Atherosclerosis ([id number]).

## 2020-10-24 IMAGING — MR MR HEAD W/O CM
10 of 11 series · 43 of 48 positions shown · non-contrast
Comparison: CT HEAD September 15, 2018

CLINICAL DATA: Altered mental status. Hypertensive. Acute onset
memory loss and confusion. History of metastatic prostate cancer,
alcohol dependence, hypertension, hyperlipidemia.

EXAM:
MRI HEAD WITHOUT CONTRAST
TECHNIQUE: Multiplanar, multiecho pulse sequences of the brain and surrounding
structures were obtained without intravenous contrast.

[Series 5: DWI · axial · 3.0mm · 0.88mm/px · z∈[-40,+106]mm · 10 of 100 slices shown (1 of 4)]
[im 1/100]
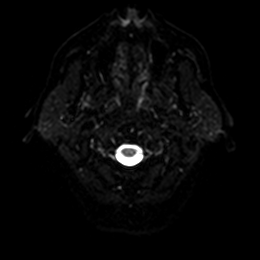
[im 12/100]
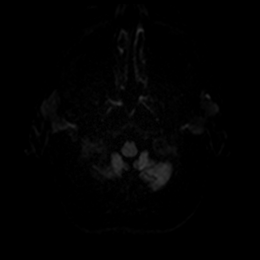
[im 23/100]
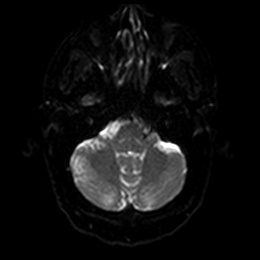
[im 34/100]
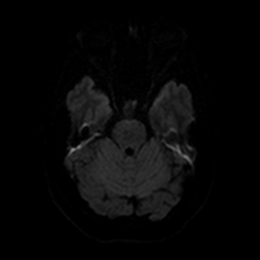
[im 45/100]
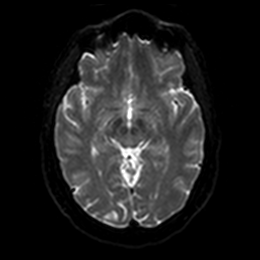
[im 56/100]
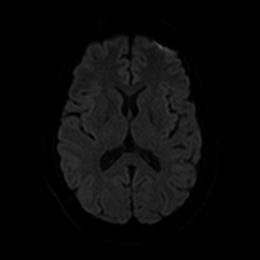
[im 67/100]
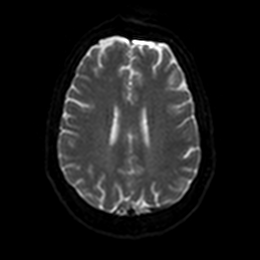
[im 78/100]
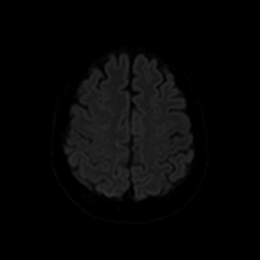
[im 89/100]
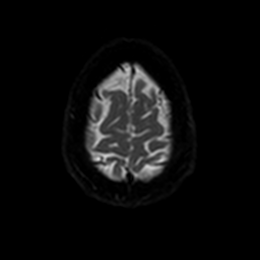
[im 100/100]
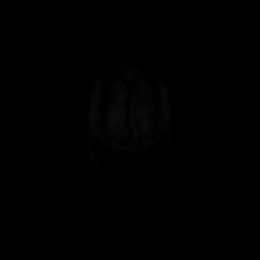

[Series 6: DWI · axial · 3.0mm · 0.88mm/px · z∈[-40,+106]mm · 5 of 50 slices shown (2 of 4)]
[im 1/50]
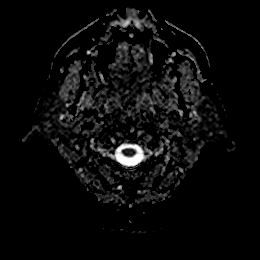
[im 13/50]
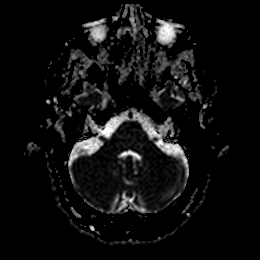
[im 25/50]
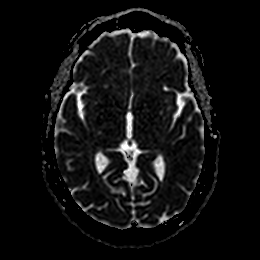
[im 37/50]
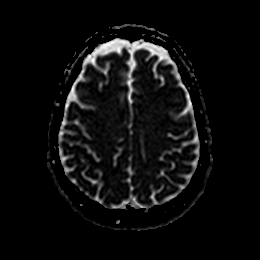
[im 50/50]
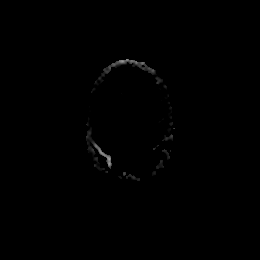

[Series 7: DWI · coronal · 4.0mm · 0.88mm/px · 6 of 70 slices shown (3 of 4)]
[im 1/70]
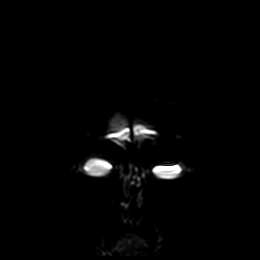
[im 14/70]
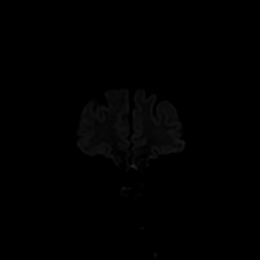
[im 28/70]
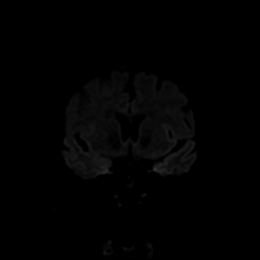
[im 42/70]
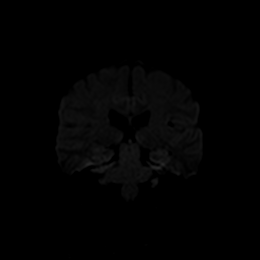
[im 56/70]
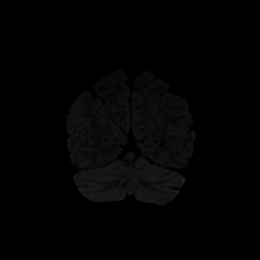
[im 70/70]
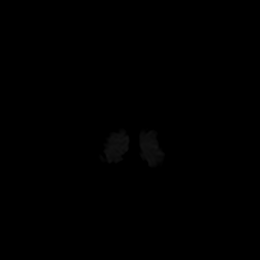

[Series 8: DWI · coronal · 4.0mm · 0.88mm/px · 3 of 35 slices shown (4 of 4)]
[im 1/35]
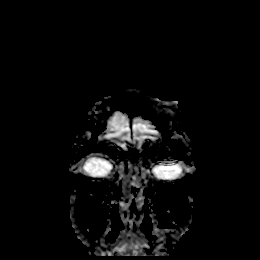
[im 18/35]
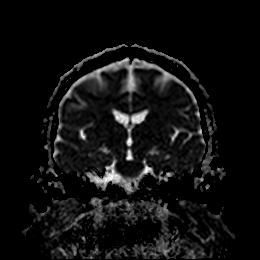
[im 35/35]
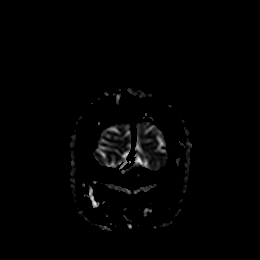

[Series 9: T1 · sagittal · 5.0mm · 0.75mm/px · 2 of 23 slices shown]
[im 1/23]
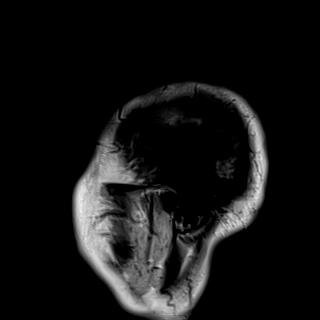
[im 23/23]
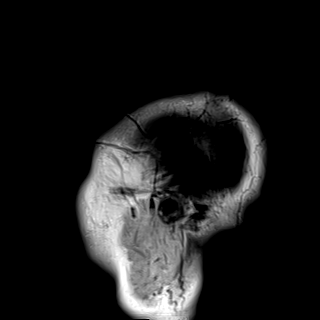

[Series 10: T2 · axial · 5.0mm · 0.72mm/px · z∈[-36,+107]mm · 2 of 25 slices shown]
[im 1/25]
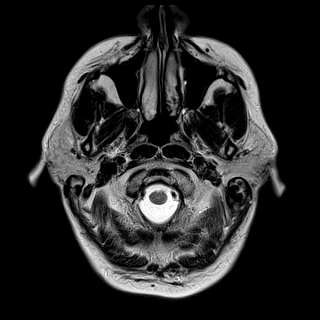
[im 25/25]
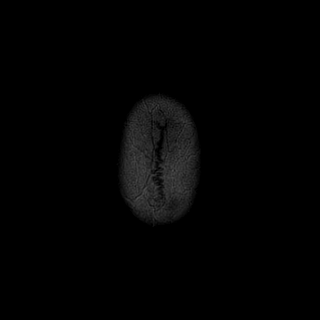

[Series 11: FLAIR · axial · 5.0mm · 0.45mm/px · z∈[-36,+107]mm · 2 of 25 slices shown]
[im 1/25]
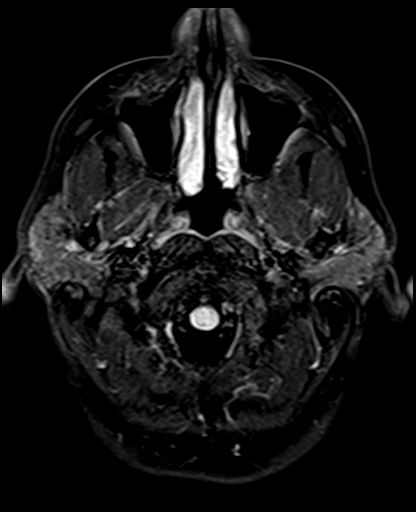
[im 25/25]
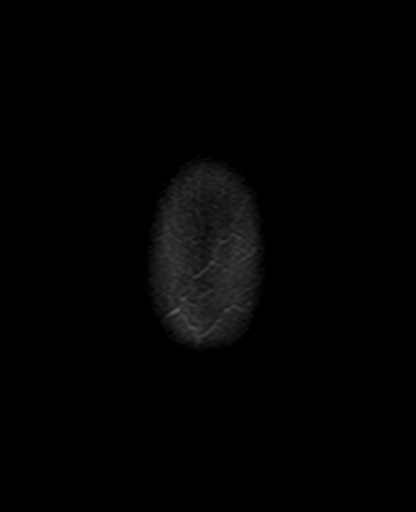

[Series 12: swi_images · axial · 3.0mm · 0.90mm/px · z∈[-47,+129]mm · 5 of 60 slices shown]
[im 1/60]
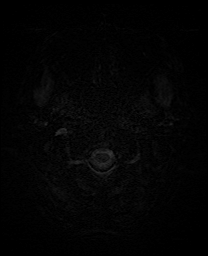
[im 15/60]
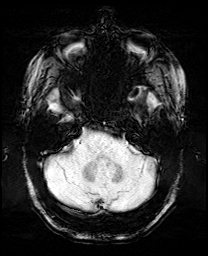
[im 30/60]
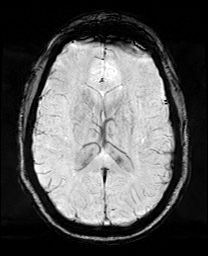
[im 45/60]
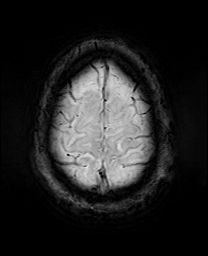
[im 60/60]
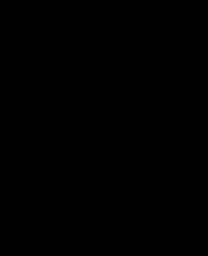

[Series 13: mip_images(sw) · axial · 24.0mm · 0.90mm/px · z∈[-37,+119]mm · 5 of 53 slices shown]
[im 1/53]
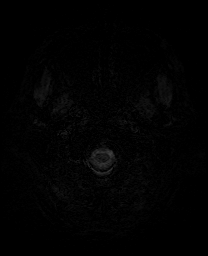
[im 14/53]
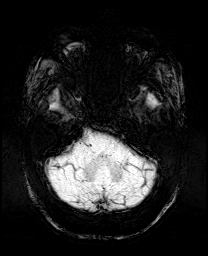
[im 27/53]
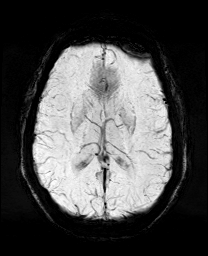
[im 40/53]
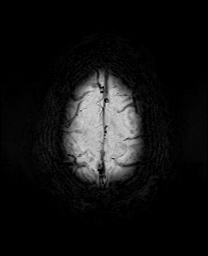
[im 53/53]
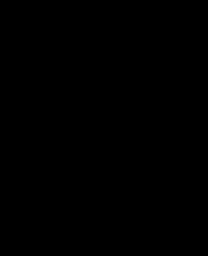

[Series 15: T2 post-contrast · coronal · 5.0mm · 0.72mm/px · 3 of 28 slices shown]
[im 1/28]
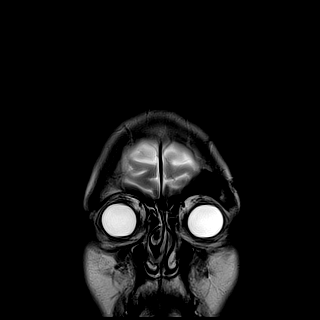
[im 14/28]
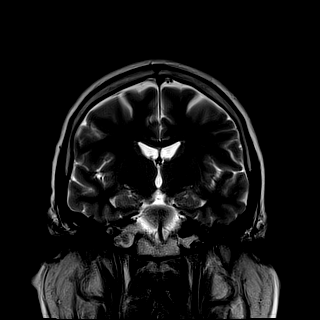
[im 28/28]
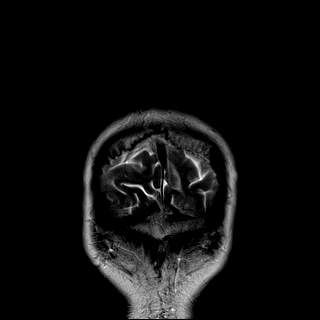

[43 of 48 positions shown; findings below may reference images not displayed]

FINDINGS: INTRACRANIAL CONTENTS: No reduced diffusion to suggest acute
ischemia. No susceptibility artifact to suggest hemorrhage. The
ventricles and sulci are normal for patient's age. A few nonspecific
punctate supratentorial white matter FLAIR T2 hyperintensities are
normal for age. No suspicious parenchymal signal, masses, mass
effect. No abnormal extra-axial fluid collections. No extra-axial
masses.

VASCULAR: Normal major intracranial vascular flow voids present at
skull base.

SKULL AND UPPER CERVICAL SPINE: No abnormal sellar expansion. No
suspicious calvarial bone marrow signal. Craniocervical junction
maintained.

SINUSES/ORBITS: The mastoid air-cells and included paranasal sinuses
are well-aerated.The included ocular globes and orbital contents are
non-suspicious.

OTHER: None.
IMPRESSION: Normal MRI head without contrast for age.

## 2020-11-06 ENCOUNTER — Encounter: Payer: Self-pay | Admitting: Allergy and Immunology

## 2020-11-06 ENCOUNTER — Other Ambulatory Visit: Payer: Self-pay

## 2020-11-06 ENCOUNTER — Ambulatory Visit (INDEPENDENT_AMBULATORY_CARE_PROVIDER_SITE_OTHER): Payer: 59 | Admitting: Allergy and Immunology

## 2020-11-06 VITALS — BP 142/72 | HR 52 | Resp 14 | Ht 71.5 in | Wt 252.0 lb

## 2020-11-06 DIAGNOSIS — J4520 Mild intermittent asthma, uncomplicated: Secondary | ICD-10-CM | POA: Diagnosis not present

## 2020-11-06 MED ORDER — VENTOLIN HFA 108 (90 BASE) MCG/ACT IN AERS: INHALATION_SPRAY | RESPIRATORY_TRACT | 1 refills | Status: DC

## 2020-11-06 NOTE — Progress Notes (Signed)
[place] - [place] - [place] - [place] - [place]   Dear Dr. Larose Kells,  Thank you for referring Gary [name] to the [facility] on 11/06/2020.   Below is a summation of this patient's evaluation and recommendations.  Thank you for your referral. I will keep you informed about this patient's response to treatment.   If you have any questions please do not hesitate to contact me.   Sincerely,  Jiles Prows, MD Allergy / Immunology [place]   ______________________________________________________________________    NEW PATIENT NOTE  Referring Provider: Colon Branch, MD Primary Provider: Colon Branch, MD Date of office visit: 11/06/2020    Subjective:   Chief Complaint:  Gary [name] (DOB: [birth year]) is a 65 y.o. male who presents to the clinic on 11/06/2020 with a chief complaint of Medication Reaction .     HPI: Gary [name] presents to this clinic in evaluation of wheezing.  Apparently in September 2021 he started wheezing that was inspiratory and expiratory and was associated with some shortness of breath.  He did not have any additional respiratory tract symptoms.  He did not have a cough or sputum production or chest pain.  He did have some nasal congestion which has been a longstanding issue that he treats with nasal saline.  His ability to exercise / walk was unaltered, he did not have any nocturnal symptoms that interfered with his sleep, and he did not require any therapy.  Interestingly, he stopped mesalamine in November 2021 and almost all of his wheezing and shortness of breath resolved.  This improvement appears to occur within several weeks of discontinuing this agent.  He was using mesalamine since July 2021 as part of a therapy for his ulcerative colitis which apparently required a fair amount of medical intervention during 2021 but is currently under complete control  without any GI symptoms.  It should be noted that he can consume ibuprofen and naproxen with no problem at all.  He never develops any respiratory tract symptoms while using these nonsteroidal anti-inflammatory drugs.  He does not really have an atopic history in general.  He does not really remember a history of childhood asthma.  He does not have any classic reflux symptoms.  He has received 3 Pfizer COVID vaccines and a flu vaccine this year.  Past Medical History:  Diagnosis Date   Anxiety    CAD (coronary artery disease) 10-2012   h/o stents    Charcot-Marie disease 07/28/2019   Elevated PSA    Prostate Bx w/ Dr. Tresa [name]   EtOH dependence [facility])    Family history of adverse reaction to anesthesia    made father disoriented   Gross hematuria    Hepatic steatosis    Hyperlipidemia LDL goal < 70    Hypertension    Metastatic adenocarcinoma to prostate ([place]) 2017   Gleason score 9, metastasis to R external Iliac node and large extraprostatic extension at prostatectomy   Multiple sclerosis ([place]) 1991   Bilateral optic neuritis, treated and resolved but felt to be a manifestation of MS.   Renal cyst, left    Bosniak 2K (indeterminant) hyperdense cyst CT 2014, Stable CT in 2016   Ulcerative colitis ([place])     Past Surgical History:  Procedure Laterality Date   FOOT SURGERY     R toe Fx (screws),  L dorsal FX (screws)   LEFT HEART CATHETERIZATION WITH  CORONARY ANGIOGRAM N/A 10/22/2012   Procedure: LEFT HEART CATHETERIZATION WITH CORONARY ANGIOGRAM;  Surgeon: Sinclair Grooms, MD;  Location: [facility] CATH LAB;  Service: Cardiovascular;  Laterality: N/A;   LYMPHADENECTOMY Bilateral 11/24/2015   Procedure: BILATERAL PELVIC LYMPHADENECTOMY;  Surgeon: Alexis Frock, MD;  Location: [facility] ORS;  Service: Urology;  Laterality: Bilateral;   PERCUTANEOUS CORONARY STENT INTERVENTION (PCI-S) N/A 10/23/2012   Procedure: PERCUTANEOUS CORONARY STENT INTERVENTION (PCI-S);  Surgeon: Sinclair Grooms, MD;  Location: [facility] CATH LAB;  Service: Cardiovascular;  Laterality: N/A;   PROSTATE BIOPSY  09/2015   Dr. Tresa [name]   ROBOT ASSISTED LAPAROSCOPIC RADICAL PROSTATECTOMY N/A 11/24/2015   Procedure: XI ROBOTIC ASSISTED LAPAROSCOPIC RADICAL PROSTATECTOMY WITH INDOCYANINE GREEN DYE;  Surgeon: Alexis Frock, MD;  Location: [facility] ORS;  Service: Urology;  Laterality: N/A;   VASECTOMY      Allergies as of 11/06/2020   No Known Allergies     Medication List       abiraterone acetate 250 MG tablet Commonly known as: ZYTIGA Take 4 tablets by mouth once per day on an empty stomach, 1 hour before or 2 hours after a meal.   amLODipine 10 MG tablet Commonly known as: NORVASC Take 1 tablet (10 mg total) by mouth daily.   aspirin 81 MG tablet Take 81 mg by mouth daily.   atorvastatin 20 MG tablet Commonly known as: LIPITOR Take 1 tablet (20 mg total) by mouth daily.   carvedilol 25 MG tablet Commonly known as: COREG Take 1 tablet (25 mg total) by mouth 2 (two) times daily with a meal.   hydrocortisone 100 MG/60ML enema Commonly known as: CORTENEMA   levothyroxine 100 MCG tablet Commonly known as: SYNTHROID Take 1 tablet (100 mcg total) by mouth daily before breakfast.   lisinopril 40 MG tablet Commonly known as: ZESTRIL Take 1 tablet (40 mg total) by mouth daily.   LUPRON IJ every 6 (six) months.   nitroGLYCERIN 0.4 MG SL tablet Commonly known as: NITROSTAT Place 1 tablet (0.4 mg total) under the tongue every 5 (five) minutes as needed for chest pain.   oxymetazoline 0.05 % nasal spray Commonly known as: AFRIN Place 2 sprays into both nostrils 2 (two) times daily as needed for congestion. As directed   sertraline 100 MG tablet Commonly known as: ZOLOFT Take 1 tablet (100 mg total) by mouth daily.   sildenafil 20 MG tablet Commonly known as: REVATIO Take 3-4 tablets (60-80 mg total) by mouth at bedtime as needed.   sodium chloride 0.65 % Soln nasal spray Commonly  known as: OCEAN Place 1 spray into both nostrils as needed for congestion.   spironolactone 25 MG tablet Commonly known as: Aldactone Take 0.5 tablets (12.5 mg total) by mouth daily.       Review of systems negative except as noted in HPI / PMHx or noted below:  Review of Systems  Constitutional: Negative.   HENT: Negative.   Eyes: Negative.   Respiratory: Negative.   Cardiovascular: Negative.   Gastrointestinal: Negative.   Genitourinary: Negative.   Musculoskeletal: Negative.   Skin: Negative.   Neurological: Negative.   Endo/Heme/Allergies: Negative.   Psychiatric/Behavioral: Negative.     Family History  Problem Relation Age of Onset   Dementia Mother    Hypertension Mother    Coronary artery disease Father    Diabetes Mellitus II Father    Lupus Sister    Coronary artery disease Brother    Diabetes Brother    Hypertension Brother  Prostate cancer Brother    Thyroid cancer Sister    Heart failure Sister    Colon cancer Neg Hx     Social History   Socioeconomic History   Marital status: Married    Spouse name: Not on file   Number of children: 1   Years of education: Not on file   Highest education level: Not on file  Occupational History   Occupation: unemployed   Tobacco Use   Smoking status: Former Smoker    Quit date: 07/18/1990    Years since quitting: 30.3   Smokeless tobacco: Former Systems developer   Tobacco comment: 1991  Substance and Sexual Activity   Alcohol use: Yes    Comment: weekly   Drug use: Yes    Types: Marijuana   Sexual activity: Not on file  Other Topics Concern   Not on file  Social History Narrative   Household-- pt and wife   Has an adult son   Environmental and Social history  Lives in a house with a dry environment, no animals located inside the household, would in the bedroom floor, plastic on the bed, no plastic on the pillow, no smoking ongoing with inside the household.  Objective:   Vitals:    11/06/20 1422  BP: (!) 142/72  Pulse: (!) 52  Resp: 14  SpO2: 96%   Height: 5' 11.5" (181.6 cm) Weight: 252 lb (114.3 kg)  Physical Exam  Diagnostics: Allergy skin tests were performed.  He demonstrated very slight hypersensitivity against mold on intradermal skin testing.  Spirometry was performed and demonstrated an FEV1 of 3.01 @ 95 % of predicted. FEV1/FVC = 0.84  Results of blood tests obtained 15 June 2020 identified WBC 5.7, absolute eosinophil 100, absolute lymphocyte 1900, hemoglobin 13.7, platelet 171.  Results of a chest x-ray obtained 15 September 2018 identified the following:  Lungs are clear. Heart is upper normal in size with pulmonary vascularity normal. No adenopathy. No pacemaker evident. There is a coronary stent on the left. There is aortic atherosclerosis. No blastic or lytic bone lesions. No metallic foreign body evident.   Assessment and Plan:    1. Asthma, mild intermittent, well-controlled     1.  Allergen avoidance measures?  2.  Continues albuterol HFA -2 inhalations every 4-6 hours if needed  3.  Contact this clinic if redeveloping problems with wheezing and shortness of breath  4.  At this point no limitation on medication use including mesalamine  I am not really sure why Tim had an episode of wheezing and some shortness of breath last year but certainly this does appear to have resolved at this point and this resolution does have a temporal relationship with discontinuing his mesalamine.  Whether or not mesalamine use was contributing to this issue is an open question and given the fact that he was using this agent for months before developing any respiratory tract symptoms I doubt that that was actually the case.  He does not have a history of having an adverse drug response directed against nonsteroidal anti-inflammatory drugs and he can take ibuprofen and naproxen with no problem.  For now are just going to have him use a short acting  bronchodilator as needed and I am not going to place any restriction on his use of medications including mesalamine.  If he redevelops problems with wheezing or shortness of breath in the future, with or without mesalamine use, he can contact me and we will work through this issue in more  detail.  Jiles Prows, MD Allergy / Immunology [facility] [place]

## 2020-11-06 NOTE — Patient Instructions (Addendum)
  1.  Allergen avoidance measures?  2.  Continues albuterol HFA -2 inhalations every 4-6 hours if needed  3.  Contact this clinic if redeveloping problems with wheezing and shortness of breath  4.  At this point no limitation on medication use including mesalamine

## 2020-11-07 ENCOUNTER — Encounter: Payer: Self-pay | Admitting: Allergy and Immunology

## 2020-11-14 ENCOUNTER — Encounter: Payer: Self-pay | Admitting: Internal Medicine

## 2020-11-14 ENCOUNTER — Ambulatory Visit (INDEPENDENT_AMBULATORY_CARE_PROVIDER_SITE_OTHER): Payer: 59 | Admitting: Internal Medicine

## 2020-11-14 ENCOUNTER — Other Ambulatory Visit: Payer: Self-pay

## 2020-11-14 VITALS — BP 142/93 | HR 52 | Temp 97.8°F | Resp 16 | Ht 72.0 in | Wt 253.0 lb

## 2020-11-14 DIAGNOSIS — E039 Hypothyroidism, unspecified: Secondary | ICD-10-CM | POA: Diagnosis not present

## 2020-11-14 DIAGNOSIS — F419 Anxiety disorder, unspecified: Secondary | ICD-10-CM

## 2020-11-14 DIAGNOSIS — I10 Essential (primary) hypertension: Secondary | ICD-10-CM

## 2020-11-14 DIAGNOSIS — F32A Depression, unspecified: Secondary | ICD-10-CM

## 2020-11-14 DIAGNOSIS — F1020 Alcohol dependence, uncomplicated: Secondary | ICD-10-CM | POA: Diagnosis not present

## 2020-11-14 DIAGNOSIS — E785 Hyperlipidemia, unspecified: Secondary | ICD-10-CM

## 2020-11-14 LAB — COMPREHENSIVE METABOLIC PANEL
ALT: 49 U/L (ref 0–53)
AST: 35 U/L (ref 0–37)
Albumin: 4.3 g/dL (ref 3.5–5.2)
Alkaline Phosphatase: 95 U/L (ref 39–117)
BUN: 11 mg/dL (ref 6–23)
CO2: 28 mEq/L (ref 19–32)
Calcium: 9.2 mg/dL (ref 8.4–10.5)
Chloride: 103 mEq/L (ref 96–112)
Creatinine, Ser: 0.65 mg/dL (ref 0.40–1.50)
GFR: 101.26 mL/min (ref >60.00)
Glucose, Bld: 104 mg/dL — ABNORMAL HIGH (ref 70–99)
Potassium: 4 mEq/L (ref 3.5–5.1)
Sodium: 139 mEq/L (ref 135–145)
Total Bilirubin: 0.6 mg/dL (ref 0.2–1.2)
Total Protein: 7.4 g/dL (ref 6.0–8.3)

## 2020-11-14 LAB — LIPID PANEL
Cholesterol: 162 mg/dL (ref 0–200)
HDL: 41.3 mg/dL (ref >39.00)
LDL Cholesterol: 87 mg/dL (ref 0–99)
NonHDL: 121.16
Total CHOL/HDL Ratio: 4
Triglycerides: 172 mg/dL — ABNORMAL HIGH (ref 0.0–149.0)
VLDL: 34.4 mg/dL (ref 0.0–40.0)

## 2020-11-14 LAB — TSH: TSH: 2.97 u[IU]/mL (ref 0.35–4.50)

## 2020-11-14 NOTE — Progress Notes (Unsigned)
Pre visit review using our clinic review tool, if applicable. No additional management support is needed unless otherwise documented below in the visit note. 

## 2020-11-14 NOTE — Patient Instructions (Addendum)
Continue your medications as you are doing including aspirin Call for refills when you need them.  Today your blood pressure is a little high at 142/93, check your blood pressure twice a week  BP GOAL is between 110/65 and  135/85. If it is consistently higher or lower, let me know       GO TO THE LAB : Get the blood work     [place], [place] back for a physical exam in 3 months

## 2020-11-14 NOTE — Progress Notes (Signed)
Subjective:    Patient ID: Gary [name], male    DOB: 08-Dec-1955, 65 y.o.   MRN: [id number]  DOS:  11/14/2020 Type of visit - description: Follow-up  Since the last visit, patient's wife passed away from [place].   Overall he is handling the situation okay.  Did drink heavily a couple of times but not anymore. No recent ambulatory BPs   Review of Systems Denies chest pain or difficulty breathing No lower extremity edema  Past Medical History:  Diagnosis Date  . Anxiety   . CAD (coronary artery disease) 10-2012   h/o stents   . Charcot-Marie disease 07/28/2019  . Elevated PSA    Prostate Bx w/ Dr. Tresa [name]  . EtOH dependence ([place])   . Family history of adverse reaction to anesthesia    made father disoriented  . Gross hematuria   . Hepatic steatosis   . Hyperlipidemia LDL goal < 70   . Hypertension   . Metastatic adenocarcinoma to prostate ([place]) 2017   Gleason score 9, metastasis to R external Iliac node and large extraprostatic extension at prostatectomy  . Multiple sclerosis ([place]) 1991   Bilateral optic neuritis, treated and resolved but felt to be a manifestation of MS.  . Renal cyst, left    Bosniak 2K (indeterminant) hyperdense cyst CT 2014, Stable CT in 2016  . Ulcerative colitis [facility])     Past Surgical History:  Procedure Laterality Date  . FOOT SURGERY     R toe Fx (screws),  L dorsal FX (screws)  . LEFT HEART CATHETERIZATION WITH CORONARY ANGIOGRAM N/A 10/22/2012   Procedure: LEFT HEART CATHETERIZATION WITH CORONARY ANGIOGRAM;  Surgeon: Sinclair Grooms, MD;  Location: [facility] CATH LAB;  Service: Cardiovascular;  Laterality: N/A;  . LYMPHADENECTOMY Bilateral 11/24/2015   Procedure: BILATERAL PELVIC LYMPHADENECTOMY;  Surgeon: Alexis Frock, MD;  [facility];  Service: Urology;  Laterality: Bilateral;  . PERCUTANEOUS CORONARY STENT INTERVENTION (PCI-S) N/A 10/23/2012   Procedure: PERCUTANEOUS CORONARY STENT INTERVENTION (PCI-S);  Surgeon: Sinclair Grooms, MD;   Location[facility] C[facility] LAB;  Service: Cardiovascular;  Laterality: N/A;  . PROSTATE BIOPSY  09/2015   Dr. Tresa [name]  . ROBOT ASSISTED LAPAROSCOPIC RADICAL PROSTATECTOMY N/A 11/24/2015   Procedure: XI ROBOTIC ASSISTED LAPAROSCOPIC RADICAL PROSTATECTOMY WITH INDOCYANINE GREEN DYE;  Surgeon: Alexis Frock, MD;  [facility];  Service: Urology;  Laterality: N/A;  . VASECTOMY      Allergies as of 11/14/2020   No Known Allergies     Medication List       Accurate as of November 14, 2020 11:59 PM. If you have any questions, ask your nurse or doctor.        STOP taking these medications   oxymetazoline 0.05 % nasal spray Commonly known as: AFRIN Stopped by: [name], MD     TAKE these medications   abiraterone acetate 250 MG tablet Commonly known as: ZYTIGA Take 4 tablets by mouth once per day on an empty stomach, 1 hour before or 2 hours after a meal.   amLODipine 10 MG tablet Commonly known as: NORVASC Take 1 tablet (10 mg total) by mouth daily.   aspirin 81 MG tablet Take 81 mg by mouth daily.   atorvastatin 20 MG tablet Commonly known as: LIPITOR Take 1 tablet (20 mg total) by mouth daily.   carvedilol 25 MG tablet Commonly known as: COREG Take 1 tablet (25 mg total) by mouth 2 (two) times daily with a meal.  hydrocortisone 100 MG/60ML enema Commonly known as: CORTENEMA   levothyroxine 100 MCG tablet Commonly known as: SYNTHROID Take 1 tablet (100 mcg total) by mouth daily before breakfast.   lisinopril 40 MG tablet Commonly known as: ZESTRIL Take 1 tablet (40 mg total) by mouth daily.   LUPRON IJ every 6 (six) months.   nitroGLYCERIN 0.4 MG SL tablet Commonly known as: NITROSTAT Place 1 tablet (0.4 mg total) under the tongue every 5 (five) minutes as needed for chest pain.   sertraline 100 MG tablet Commonly known as: ZOLOFT Take 1 tablet (100 mg total) by mouth daily.   sildenafil 20 MG tablet Commonly known as: REVATIO Take 3-4 tablets (60-80 mg total) by  mouth at bedtime as needed.   sodium chloride 0.65 % Soln nasal spray Commonly known as: OCEAN Place 1 spray into both nostrils as needed for congestion.   spironolactone 25 MG tablet Commonly known as: Aldactone Take 0.5 tablets (12.5 mg total) by mouth daily.   Ventolin HFA 108 (90 Base) MCG/ACT inhaler Generic drug: albuterol Can inhale two puffs every four to six hours as needed for cough or wheeze.          Objective:   Physical Exam BP (!) 142/93 (BP Location: Left Arm, Patient Position: Sitting, Cuff Size: Gary)   Pulse (!) 52   Temp 97.8 F (36.6 C) (Oral)   Resp 16   Ht 6' (1.829 m)   Wt 253 lb (114.8 kg)   SpO2 96%   BMI 34.31 kg/m  General:   Well developed, NAD, BMI noted. HEENT:  Normocephalic . Face symmetric, atraumatic Lungs:  CTA B Gary respiratory effort, no intercostal retractions, no accessory muscle use. Heart: RRR,  no murmur.  Lower extremities: no pretibial edema bilaterally  Skin: Not pale. Not jaundice Neurologic:  alert & oriented X3.  Speech Gary, gait appropriate for age and unassisted Psych--  Cognition and judgment appear intact.  Cooperative with Gary attention span and concentration.  Behavior appropriate. He seemed appropriately sad for the recent loss of his wife     Assessment     Assessment  Pre-diabetes HTN Hypokalemia: Saw endo 09/09/2019, w/u:no clear evidence of aldosterone producing tumor.  Spironolactone would be a good addition to her BP regimen Hyperlipidemia Anxiety CAD s/p stents 2014  EtOH dependency Ulcerative colitis- last cscope 05-2015 GU: -Metastatic prostate cancer, DX 11-2015, radical prostatectomy 11-2015 --  continent as off  07-2016--on androgen depravation  -Hematuria, saw urology 01-2013 reviewed: CT show a renal cyst, cystoscopy negative, they recommended follow-up MRI for the renal cyst. NEURO: -MS  Dx 1991 (B neuritis, rx, resolved, felt to be d/t MS) -Bilateral thenar atrophy noted  06-2017 (apparently chronic) -Neuropathy: severe per  [facility] 07-2019 +, likely Charcot Mary Tooth Nose bleed: R nostril artery cauterize 08-2017 -TGA: DX 09/2018, MRI brain, EEG negative saw neurology in follow-up, carotid ultrasound ordered: 1 to 39% bilaterally.  PLAN HTN, currently on amlodipine 10 mg, carvedilol 25 mg twice daily, lisinopril 40 mg.  Started, Aldactone 25 mg  09/11/2020,DNKA for f/u BMP. BP today slightly elevated, no recent ambulatory BPs. Plan: CMP, continue present care, monitor BPs at home, see AVS. Hyperlipidemia:Continue Lipitor, checking labs Hypothyroidism: Check TSH Anxiety: Since the last visit, lost his wife to [place] on 11/01/2020, he seems to be appropriately sad, he has a history of EtOH and drank heavily a couple of times. I expressed my sympathy, he knows to call me if he feels he needs more medication, also  to reach out for help if he continue drinking heavily. He has the option to see a counselor for hospice, rec to take advantage of that. Family support includes his son, has 2 brothers out of the states and his wife's family. Continue sertraline Allergy to Apriso? Saw allergy 11/06/2020, please see detailed note was recommended to proceed with Apriso as no obvious allergies to NSAIDs were noted RTC CPX 3 months   This visit occurred during the SARS-CoV-2 public health emergency.  Safety protocols were in place, including screening questions prior to the visit, additional usage of staff PPE, and extensive cleaning of exam room while observing appropriate contact time as indicated for disinfecting solutions.

## 2020-11-15 NOTE — Assessment & Plan Note (Signed)
HTN, currently on amlodipine 10 mg, carvedilol 25 mg twice daily, lisinopril 40 mg.  Started, Aldactone 25 mg  09/11/2020,DNKA for f/u BMP. BP today slightly elevated, no recent ambulatory BPs. Plan: CMP, continue present care, monitor BPs at home, see AVS. Hyperlipidemia:Continue Lipitor, checking labs Hypothyroidism: Check TSH Anxiety: Since the last visit, lost his wife to [place] on 11/01/2020, he seems to be appropriately sad, he has a history of EtOH and drank heavily a couple of times. I expressed my sympathy, he knows to call me if he feels he needs more medication, also to reach out for help if he continue drinking heavily. He has the option to see a counselor for hospice, rec to take advantage of that. Family support includes his son, has 2 brothers out of the states and his wife's family. Continue sertraline Allergy to Apriso? Saw allergy 11/06/2020, please see detailed note was recommended to proceed with Apriso as no obvious allergies to NSAIDs were noted RTC CPX 3 months

## 2020-12-01 ENCOUNTER — Other Ambulatory Visit: Payer: Self-pay | Admitting: Internal Medicine

## 2020-12-13 ENCOUNTER — Inpatient Hospital Stay (HOSPITAL_BASED_OUTPATIENT_CLINIC_OR_DEPARTMENT_OTHER): Payer: 59 | Admitting: Oncology

## 2020-12-13 ENCOUNTER — Other Ambulatory Visit: Payer: Self-pay

## 2020-12-13 ENCOUNTER — Inpatient Hospital Stay: Payer: 59 | Attending: Oncology

## 2020-12-13 VITALS — BP 133/73 | HR 54 | Temp 97.0°F | Resp 18 | Ht 72.0 in | Wt 254.0 lb

## 2020-12-13 DIAGNOSIS — C61 Malignant neoplasm of prostate: Secondary | ICD-10-CM | POA: Diagnosis not present

## 2020-12-13 DIAGNOSIS — Z79899 Other long term (current) drug therapy: Secondary | ICD-10-CM | POA: Insufficient documentation

## 2020-12-13 DIAGNOSIS — I10 Essential (primary) hypertension: Secondary | ICD-10-CM | POA: Diagnosis not present

## 2020-12-13 DIAGNOSIS — Z7982 Long term (current) use of aspirin: Secondary | ICD-10-CM | POA: Insufficient documentation

## 2020-12-13 LAB — CBC WITH DIFFERENTIAL (CANCER CENTER ONLY)
Abs Immature Granulocytes: 0.03 10*3/uL (ref 0.00–0.07)
Basophils Absolute: 0.1 10*3/uL (ref 0.0–0.1)
Basophils Relative: 1 %
Eosinophils Absolute: 0.2 10*3/uL (ref 0.0–0.5)
Eosinophils Relative: 3 %
HCT: 40.3 % (ref 39.0–52.0)
Hemoglobin: 13.8 g/dL (ref 13.0–17.0)
Immature Granulocytes: 0 %
Lymphocytes Relative: 32 %
Lymphs Abs: 2.1 10*3/uL (ref 0.7–4.0)
MCH: 28.2 pg (ref 26.0–34.0)
MCHC: 34.2 g/dL (ref 30.0–36.0)
MCV: 82.2 fL (ref 80.0–100.0)
Monocytes Absolute: 0.4 10*3/uL (ref 0.1–1.0)
Monocytes Relative: 6 %
Neutro Abs: 3.9 10*3/uL (ref 1.7–7.7)
Neutrophils Relative %: 58 %
Platelet Count: 195 10*3/uL (ref 150–400)
RBC: 4.9 MIL/uL (ref 4.22–5.81)
RDW: 13.6 % (ref 11.5–15.5)
WBC Count: 6.7 10*3/uL (ref 4.0–10.5)
nRBC: 0 % (ref 0.0–0.2)

## 2020-12-13 LAB — CMP (CANCER CENTER ONLY)
ALT: 66 U/L — ABNORMAL HIGH (ref 0–44)
AST: 33 U/L (ref 15–41)
Albumin: 4.1 g/dL (ref 3.5–5.0)
Alkaline Phosphatase: 100 U/L (ref 38–126)
Anion gap: 9 (ref 5–15)
BUN: 15 mg/dL (ref 8–23)
CO2: 21 mmol/L — ABNORMAL LOW (ref 22–32)
Calcium: 9.2 mg/dL (ref 8.9–10.3)
Chloride: 108 mmol/L (ref 98–111)
Creatinine: 0.79 mg/dL (ref 0.61–1.24)
GFR, Estimated: >60 mL/min (ref >60)
Glucose, Bld: 110 mg/dL — ABNORMAL HIGH (ref 70–99)
Potassium: 4.3 mmol/L (ref 3.5–5.1)
Sodium: 138 mmol/L (ref 135–145)
Total Bilirubin: 0.4 mg/dL (ref 0.3–1.2)
Total Protein: 8.2 g/dL — ABNORMAL HIGH (ref 6.5–8.1)

## 2020-12-13 NOTE — Progress Notes (Signed)
Hematology and Oncology Follow Up Visit  Gary [name] [id number] [birth year] 65 y.o. 12/13/2020 10:02 AM Gary [name], MDPaz, Gary Berthold, MD   Principle Diagnosis:  65 year old man with prostate cancer diagnosed in 2017.  He presented with PSA of 60.98 and a Gleason score 4+5 = 9 and subsequently developed biochemical relapse.   Prior Therapy:   He is status post robotic-assisted laparoscopic radical prostatectomy and bilateral pelvic lymphadenectomy on 11/24/2015. One positive lymph node was identified out of a 9 examined. The final pathological staging was T3b N1.  His follow-up PSA remained elevated with a PSA on 03/07/2016 was 3.88.  Zytiga 1000 mg daily with prednisone 5 mg daily started in July 2017.  Therapy had discontinued in 2021.   Current therapy:  Androgen deprivation under the care of Dr. Tresa [name]. Zytiga 1000 mg weekly started in September 2021.  Interim History: Gary [name] presents today for repeat evaluation.  Since the last visit, he reports no major changes in his health.  He denies any recent hospitalization or illnesses.  He is taken as Zytiga intermittently at this time without any new complications.  He denies any nausea, vomiting or fatigue.  He denies any edema or fluid retention.   Medications: Updated on review. Current Outpatient Medications  Medication Sig Dispense Refill  . spironolactone (ALDACTONE) 25 MG tablet Take 0.5 tablets (12.5 mg total) by mouth daily. 45 tablet 1  . abiraterone acetate (ZYTIGA) 250 MG tablet Take 4 tablets by mouth once per day on an empty stomach, 1 hour before or 2 hours after a meal. 120 tablet 1  . amLODipine (NORVASC) 10 MG tablet Take 1 tablet (10 mg total) by mouth daily. 90 tablet 1  . aspirin 81 MG tablet Take 81 mg by mouth daily.    Marland Kitchen atorvastatin (LIPITOR) 20 MG tablet Take 1 tablet (20 mg total) by mouth daily. 90 tablet 1  . carvedilol (COREG) 25 MG tablet Take 1 tablet (25 mg total) by mouth 2 (two) times daily with a  meal. 180 tablet 1  . hydrocortisone (CORTENEMA) 100 MG/60ML enema  (Patient not taking: No sig reported)    . Leuprolide Acetate (LUPRON IJ) every 6 (six) months.    . levothyroxine (SYNTHROID) 100 MCG tablet Take 1 tablet (100 mcg total) by mouth daily before breakfast. 90 tablet 1  . lisinopril (ZESTRIL) 40 MG tablet Take 1 tablet (40 mg total) by mouth daily. 90 tablet 1  . nitroGLYCERIN (NITROSTAT) 0.4 MG SL tablet Place 1 tablet (0.4 mg total) under the tongue every 5 (five) minutes as needed for chest pain. (Patient not taking: Reported on 11/14/2020) 25 tablet 3  . sertraline (ZOLOFT) 100 MG tablet Take 1 tablet (100 mg total) by mouth daily. 90 tablet 1  . sildenafil (REVATIO) 20 MG tablet Take 3-4 tablets (60-80 mg total) by mouth at bedtime as needed. 30 tablet 3  . sodium chloride (OCEAN) 0.65 % SOLN nasal spray Place 1 spray into both nostrils as needed for congestion.    . VENTOLIN HFA 108 (90 Base) MCG/ACT inhaler Can inhale two puffs every four to six hours as needed for cough or wheeze. 18 g 1   No current facility-administered medications for this visit.     Allergies: No Known Allergies     Physical Exam:    Blood pressure 133/73, pulse (!) 54, temperature (!) 97 F (36.1 C), temperature source Tympanic, resp. rate 18, height 6' (1.829 m), weight 254 lb (115.2 kg), SpO2 98 %.  ECOG: 0    General appearance: Comfortable appearing without any discomfort Head: Normocephalic without any trauma Oropharynx: Mucous membranes are moist and pink without any thrush or ulcers. Eyes: Pupils are equal and round reactive to light. Lymph nodes: No cervical, supraclavicular, inguinal or axillary lymphadenopathy.   Heart:regular rate and rhythm.  S1 and S2 without leg edema. Lung: Clear without any rhonchi or wheezes.  No dullness to percussion. Abdomin: Soft, nontender, nondistended with good bowel sounds.  No hepatosplenomegaly. Musculoskeletal: No joint deformity or  effusion.  Full range of motion noted. Neurological: No deficits noted on motor, sensory and deep tendon reflex exam. Skin: No petechial rash or dryness.  Appeared moist.     Lab Results: Lab Results  Component Value Date   WBC 5.7 06/15/2020   HGB 13.7 06/15/2020   HCT 39.7 06/15/2020   MCV 80.9 06/15/2020   PLT 171 06/15/2020     Chemistry      Component Value Date/Time   NA 139 11/14/2020 1053   NA 140 08/27/2017 1259   K 4.0 11/14/2020 1053   K 3.8 08/27/2017 1259   CL 103 11/14/2020 1053   CO2 28 11/14/2020 1053   CO2 26 08/27/2017 1259   BUN 11 11/14/2020 1053   BUN 10.5 08/27/2017 1259   CREATININE 0.65 11/14/2020 1053   CREATININE 0.74 06/15/2020 1007   CREATININE 0.8 08/27/2017 1259      Component Value Date/Time   CALCIUM 9.2 11/14/2020 1053   CALCIUM 9.4 08/27/2017 1259   ALKPHOS 95 11/14/2020 1053   ALKPHOS 91 08/27/2017 1259   AST 35 11/14/2020 1053   AST 18 06/15/2020 1007   AST 16 08/27/2017 1259   ALT 49 11/14/2020 1053   ALT 23 06/15/2020 1007   ALT 23 08/27/2017 1259   BILITOT 0.6 11/14/2020 1053   BILITOT 0.7 06/15/2020 1007   BILITOT 0.35 08/27/2017 1259        Results for Gary [name], Gary "TIM" (MRN [id number]) as of 12/13/2020 10:03  Ref. Range 03/14/2020 00:00 06/15/2020 10:07 09/11/2020 00:00  PSA Unknown <0.015  <0.015  Prostate Specific Ag, Serum Latest Ref Range: 0.0 - 4.0 ng/mL  <0.1         Impression and Plan:  65 year old man with:  1.  Prostate cancer diagnosed in 2017.  He developed castration-sensitive with biochemical relapse subsequently.  His disease status was updated today and treatment options were reviewed.  He remains on adjuvant deprivation therapy alone with PSA undetectable based on laboratory testing in September and December 2021.  Zytiga daily has been discontinued based on his wishes and currently taking it weekly.   I do not advise that he continue with this approach at this time but I do not have any  objections she wants to continue.   Risks and benefits of additional treatment including Zytiga, Xtandi or Taxotere chemotherapy were reviewed.  At this time we opted to continue with androgen deprivation therapy along and escalate therapy in future if needed.  Complications related to these treatments including nausea, fatigue, myelosuppression among others were reviewed.  He opted against dose unless he developed castration-resistant disease.  2. Androgen depravation: I recommended continuing this indefinitely for the time being.  3. Hypertension: His blood pressure is close to normal range at this time.  This is managed by his primary care physician.  4. Follow-up: He will return in 12 months for repeat follow-up.  30  minutes were dedicated to this visit.  The time was spent  on reviewing laboratory data, disease status update treatment options and complications leading to therapy.     Zola Button, MD 3/9/202210:02 AM

## 2020-12-14 ENCOUNTER — Telehealth: Payer: Self-pay

## 2020-12-14 LAB — PROSTATE-SPECIFIC AG, SERUM (LABCORP): Prostate Specific Ag, Serum: <0.1 ng/mL (ref 0.0–4.0)

## 2020-12-14 NOTE — Telephone Encounter (Signed)
TCT call to patient informing him of his PSA per MD note below.

## 2020-12-14 NOTE — Telephone Encounter (Signed)
-----   Message from Wyatt Portela, MD sent at 12/14/2020  8:54 AM EST ----- Please let him know his PSA is still low

## 2021-02-16 ENCOUNTER — Other Ambulatory Visit: Payer: Self-pay | Admitting: Internal Medicine

## 2021-03-01 ENCOUNTER — Ambulatory Visit: Payer: 59 | Attending: Internal Medicine

## 2021-03-01 ENCOUNTER — Other Ambulatory Visit: Payer: Self-pay

## 2021-03-01 ENCOUNTER — Encounter: Payer: Self-pay | Admitting: Internal Medicine

## 2021-03-01 ENCOUNTER — Ambulatory Visit (INDEPENDENT_AMBULATORY_CARE_PROVIDER_SITE_OTHER): Payer: 59 | Admitting: Internal Medicine

## 2021-03-01 VITALS — BP 145/83 | HR 55 | Temp 97.0°F | Ht 72.0 in | Wt 261.0 lb

## 2021-03-01 DIAGNOSIS — Z23 Encounter for immunization: Secondary | ICD-10-CM

## 2021-03-01 DIAGNOSIS — E039 Hypothyroidism, unspecified: Secondary | ICD-10-CM

## 2021-03-01 DIAGNOSIS — R739 Hyperglycemia, unspecified: Secondary | ICD-10-CM | POA: Diagnosis not present

## 2021-03-01 DIAGNOSIS — Z0000 Encounter for general adult medical examination without abnormal findings: Secondary | ICD-10-CM | POA: Diagnosis not present

## 2021-03-01 LAB — TSH: TSH: 3.57 u[IU]/mL (ref 0.35–4.50)

## 2021-03-01 LAB — HEMOGLOBIN A1C: Hgb A1c MFr Bld: 5.9 % (ref 4.6–6.5)

## 2021-03-01 NOTE — Progress Notes (Signed)
   Covid-19 Vaccination Clinic  Name:  Gary [name]    MRN: [id number] DOB: [birth year]  03/01/2021  Mr. Ganser was observed post Covid-19 immunization for 15 minutes without incident. He was provided with Vaccine Information Sheet and instruction to access the V-Safe system.   Mr. Youngblood was instructed to call 911 with any severe reactions post vaccine: Marland Kitchen Difficulty breathing  . Swelling of face and throat  . A fast heartbeat  . A bad rash all over body  . Dizziness and weakness   Immunizations Administered    Name Date Dose VIS Date Route   PFIZER Comrnaty(Gray TOP) Covid-19 Vaccine 03/01/2021 11:12 AM 0.3 mL 09/14/2020 Intramuscular   Manufacturer: [employer]   Lot: [id number]   NDC: [phone]

## 2021-03-01 NOTE — Assessment & Plan Note (Signed)
Td 2015, pnm 23-2015; prevnar--2017;  S/p Shingrix;  -covid vax: rec vaccine  #4  -CCS S/p colonoscopy (~ 2011?) showed adenomatous polyps Cscope 05-2015: mild erythema, no polyps Cscope 06-2019, Dr Cristina Gong  Flex sig: 08-2020 -H/o Prostate cancer, f/u elsewhere Labs: Reviewed, will check A1c and TSH POA: Discussed with patient Lifestyle: Encouraged healthy diet and exercise

## 2021-03-01 NOTE — Patient Instructions (Addendum)
Check the  blood pressure 2 or 3 times a  week   BP GOAL is between 110/65 and  135/85. If it is consistently higher or lower, let me know    GO TO THE LAB : Get the blood work     [place], [place] back for for a checkup in 6 months   "Living will", "[place] of attorney": Advanced care planning  (If you already have a living will or healthcare power of attorney, please bring the copy to be scanned in your chart.)  Advance care planning is a process that supports adults in  understanding and sharing their preferences regarding future medical care.   The patient's preferences are recorded in documents called Advance Directives.    Advanced directives are completed (and can be modified at any time) while the patient is in full mental capacity.   The documentation should be available at all times to the patient, the family and the healthcare providers.  Bring in a copy to be scanned in your chart is an excellent idea and is recommended   This legal documents direct treatment decision making and/or appoint a surrogate to make the decision if the patient is not capable to do so.    Advance directives can be documented in many types of formats,  documents have names such as:  Lliving will  Durable power of attorney for healthcare (healthcare proxy or healthcare power of attorney)  Combined directives  Physician orders for life-sustaining treatment    More information at:  [email or web address]

## 2021-03-01 NOTE — Assessment & Plan Note (Signed)
Here for CPX Hyperglycemia: Check A1c HTN: BP today slightly elevated, Ambulatory BPs in the 130s, does not check often.  For now continue amlodipine, carvedilol, lisinopril, Aldactone.  Last BMP satisfactory.  Rec ambulatory BPs twice a week and call me if not at goal. Hyperlipidemia: On Lipitor, controlled Hypothyroidism: Check TSH CAD: Does not see cardiology regularly, asymptomatic, on aspirin, statins. Anxiety: See last visit, lost his wife to COVID, doing some counseling, overall feels better. EtOH: Still drinks heavily on and off, patient is counseled, AA?Marland Kitchen  He is really not ready to quit at this point. RTC 6 months (declined need to come back sooner).

## 2021-03-01 NOTE — Progress Notes (Signed)
Subjective:    Patient ID: Gary [name], male    DOB: 10-Jul-1956, 65 y.o.   MRN: [id number]  DOS:  03/01/2021 Type of visit - description: CPX Here for CPX. In general feels well. Emotionally has improved, see last visit. Still drinking.  BP Readings from Last 3 Encounters:  03/01/21 (!) 145/83  12/13/20 133/73  11/14/20 (!) 142/93     Review of Systems Denies chest pain, difficulty breathing.  No LUTS.  Other than above, a 14 point review of systems is negative     Past Medical History:  Diagnosis Date  . Anxiety   . CAD (coronary artery disease) 10-2012   h/o stents   . Charcot-Marie disease 07/28/2019  . Elevated PSA    Prostate Bx w/ Dr. Tresa [name]  . EtOH dependence ([place])   . Family history of adverse reaction to anesthesia    made father disoriented  . Gross hematuria   . Hepatic steatosis   . Hyperlipidemia LDL goal < 70   . Hypertension   . Metastatic adenocarcinoma to prostate ([place]) 2017   Gleason score 9, metastasis to R external Iliac node and large extraprostatic extension at prostatectomy  . Multiple sclerosis ([place]) 1991   Bilateral optic neuritis, treated and resolved but felt to be a manifestation of MS.  . Renal cyst, left    Bosniak 2K (indeterminant) hyperdense cyst CT 2014, Stable CT in 2016  . Ulcerative colitis [facility])     Past Surgical History:  Procedure Laterality Date  . FOOT SURGERY     R toe Fx (screws),  L dorsal FX (screws)  . LEFT HEART CATHETERIZATION WITH CORONARY ANGIOGRAM N/A 10/22/2012   Procedure: LEFT HEART CATHETERIZATION WITH CORONARY ANGIOGRAM;  Surgeon: Sinclair Grooms, MD;  Location: [facility] CATH LAB;  Service: Cardiovascular;  Laterality: N/A;  . LYMPHADENECTOMY Bilateral 11/24/2015   Procedure: BILATERAL PELVIC LYMPHADENECTOMY;  Surgeon: Alexis Frock, MD;  Location: [facility] ORS;  Service: Urology;  Laterality: Bilateral;  . PERCUTANEOUS CORONARY STENT INTERVENTION (PCI-S) N/A 10/23/2012   Procedure: PERCUTANEOUS CORONARY STENT  INTERVENTION (PCI-S);  Surgeon: Sinclair Grooms, MD;  Location[facility] CATH LAB;  Service: Cardiovascular;  Laterality: N/A;  . PROSTATE BIOPSY  09/2015   Dr. Tresa [name]  . ROBOT ASSISTED LAPAROSCOPIC RADICAL PROSTATECTOMY N/A 11/24/2015   Procedure: XI ROBOTIC ASSISTED LAPAROSCOPIC RADICAL PROSTATECTOMY WITH INDOCYANINE GREEN DYE;  Surgeon: Alexis Frock, MD;  Location: [facility] ORS;  Service: Urology;  Laterality: N/A;  . VASECTOMY     Social History   Socioeconomic History  . Marital status: Widowed    Spouse name: Not on file  . Number of children: 1  . Years of education: Not on file  . Highest education level: Not on file  Occupational History  . Occupation: disable   Tobacco Use  . Smoking status: Former Smoker    Quit date: 07/18/1990    Years since quitting: 30.6  . Smokeless tobacco: Former Systems developer  . Tobacco comment: 1991  Substance and Sexual Activity  . Alcohol use: Yes    Comment: weekly, heavy on-off   . Drug use: Yes    Types: Marijuana  . Sexual activity: Not on file  Other Topics Concern  . Not on file  Social History Narrative   Lives by himself    Has an adult son   Social Determinants of Health   Financial Resource Strain: Not on file  Food Insecurity: Not on file  Transportation Needs: Not on file  Physical  Activity: Not on file  Stress: Not on file  Social Connections: Not on file  Intimate Partner Violence: Not on file     Allergies as of 03/01/2021   No Known Allergies     Medication List       Accurate as of Mar 01, 2021  8:39 PM. If you have any questions, ask your nurse or doctor.        abiraterone acetate 250 MG tablet Commonly known as: ZYTIGA Take 4 tablets by mouth once per day on an empty stomach, 1 hour before or 2 hours after a meal.   amLODipine 10 MG tablet Commonly known as: NORVASC Take 1 tablet (10 mg total) by mouth daily.   aspirin 81 MG tablet Take 81 mg by mouth daily.   atorvastatin 20 MG tablet Commonly known as:  LIPITOR Take 1 tablet (20 mg total) by mouth daily.   carvedilol 25 MG tablet Commonly known as: COREG Take 1 tablet (25 mg total) by mouth 2 (two) times daily with a meal.   hydrocortisone 100 MG/60ML enema Commonly known as: CORTENEMA   levothyroxine 100 MCG tablet Commonly known as: SYNTHROID Take 1 tablet (100 mcg total) by mouth daily before breakfast.   lisinopril 40 MG tablet Commonly known as: ZESTRIL Take 1 tablet (40 mg total) by mouth daily.   LUPRON IJ every 6 (six) months.   nitroGLYCERIN 0.4 MG SL tablet Commonly known as: NITROSTAT Place 1 tablet (0.4 mg total) under the tongue every 5 (five) minutes as needed for chest pain.   sertraline 100 MG tablet Commonly known as: ZOLOFT Take 1 tablet (100 mg total) by mouth daily.   sildenafil 20 MG tablet Commonly known as: REVATIO Take 3-4 tablets (60-80 mg total) by mouth at bedtime as needed.   sodium chloride 0.65 % Soln nasal spray Commonly known as: OCEAN Place 1 spray into both nostrils as needed for congestion.   spironolactone 25 MG tablet Commonly known as: ALDACTONE TAKE 1/2 TABLET(12.5 MG) BY MOUTH DAILY   Ventolin HFA 108 (90 Base) MCG/ACT inhaler Generic drug: albuterol Can inhale two puffs every four to six hours as needed for cough or wheeze.          Objective:   Physical Exam BP (!) 145/83 (BP Location: Right Arm, Patient Position: Sitting, Cuff Size: Large)   Pulse (!) 55   Temp (!) 97 F (36.1 C) (Temporal)   Ht 6' (1.829 m)   Wt 261 lb (118.4 kg)   SpO2 96%   BMI 35.40 kg/m  General: Well developed, NAD, BMI noted Neck: No  thyromegaly  HEENT:  Normocephalic . Face symmetric, atraumatic Lungs:  CTA B Normal respiratory effort, no intercostal retractions, no accessory muscle use. Heart: RRR,  no murmur.  Abdomen:  Not distended, soft, non-tender. No rebound or rigidity.   Lower extremities: no pretibial edema bilaterally  Skin: Exposed areas without rash. Not pale. Not  jaundice Neurologic:  alert & oriented X3.  Speech normal, gait appropriate for age and unassisted Strength symmetric and appropriate for age.  Psych: Cognition and judgment appear intact.  Cooperative with normal attention span and concentration.  Behavior appropriate. No anxious or depressed appearing.     Assessment    Assessment  Pre-diabetes HTN Hypokalemia: Saw endo 09/09/2019, w/u:no clear evidence of aldosterone producing tumor.  Spironolactone would be a good addition to her BP regimen Hyperlipidemia Anxiety CAD s/p stents 2014  EtOH dependency Ulcerative colitis- last cscope 05-2015 GU: -Metastatic prostate  cancer, DX 11-2015, radical prostatectomy 11-2015 --  continent as off  07-2016--on androgen depravation  -Hematuria, saw urology 01-2013 reviewed: CT show a renal cyst, cystoscopy negative, they recommended follow-up MRI for the renal cyst. NEURO: -MS  Dx 1991 (B neuritis, rx, resolved, felt to be d/t MS) -Bilateral thenar atrophy noted 06-2017 (apparently chronic) -Neuropathy: severe per  N[facility] 07-2019 +, likely Charcot Mary Tooth Nose bleed: R nostril artery cauterize 08-2017 -TGA: DX 09/2018, MRI brain, EEG negative saw neurology in follow-up, carotid ultrasound ordered: 1 to 39% bilaterally.  PLAN Here for CPX Hyperglycemia: Check A1c HTN: BP today slightly elevated, Ambulatory BPs in the 130s, does not check often.  For now continue amlodipine, carvedilol, lisinopril, Aldactone.  Last BMP satisfactory.  Rec ambulatory BPs twice a week and call me if not at goal. Hyperlipidemia: On Lipitor, controlled Hypothyroidism: Check TSH CAD: Does not see cardiology regularly, asymptomatic, on aspirin, statins. Anxiety: See last visit, lost his wife to COVID, doing some counseling, overall feels better. EtOH: Still drinks heavily on and off, patient is counseled, AA?Marland Kitchen  He is really not ready to quit at this point. RTC 6 months (declined need to come back sooner).    This  visit occurred during the SARS-CoV-2 public health emergency.  Safety protocols were in place, including screening questions prior to the visit, additional usage of staff PPE, and extensive cleaning of exam room while observing appropriate contact time as indicated for disinfecting solutions.

## 2021-03-06 NOTE — Progress Notes (Signed)
Order(s) created erroneously. Erroneous order ID: [id number]  Order moved by: Genia Harold D  Order move date/time: 03/06/2021 5:03 PM  Source Patient: [id number]  Source Contact: 09/12/2020  Destination Patient: [id number]  Destination Contact: 05/25/2020

## 2021-03-09 ENCOUNTER — Other Ambulatory Visit (HOSPITAL_BASED_OUTPATIENT_CLINIC_OR_DEPARTMENT_OTHER): Payer: Self-pay

## 2021-03-09 MED ORDER — PFIZER-BIONT COVID-19 VAC-TRIS 30 MCG/0.3ML IM SUSP
INTRAMUSCULAR | 0 refills | Status: DC
  Filled 2021-03-09: qty 0.3, 1d supply, fill #0

## 2021-04-10 DIAGNOSIS — C775 Secondary and unspecified malignant neoplasm of intrapelvic lymph nodes: Secondary | ICD-10-CM | POA: Diagnosis not present

## 2021-06-13 ENCOUNTER — Encounter: Payer: Self-pay | Admitting: Internal Medicine

## 2021-06-13 MED ORDER — ATORVASTATIN CALCIUM 20 MG PO TABS: 20.0000 mg | ORAL_TABLET | Freq: Every day | ORAL | 1 refills | Status: DC

## 2021-06-13 MED ORDER — SERTRALINE HCL 100 MG PO TABS: 100.0000 mg | ORAL_TABLET | Freq: Every day | ORAL | 1 refills | Status: DC

## 2021-06-13 MED ORDER — LISINOPRIL 40 MG PO TABS: 40.0000 mg | ORAL_TABLET | Freq: Every day | ORAL | 1 refills | Status: DC

## 2021-06-13 MED ORDER — CARVEDILOL 25 MG PO TABS
25.0000 mg | ORAL_TABLET | Freq: Two times a day (BID) | ORAL | 1 refills | Status: DC
Start: 2021-06-13 — End: 2021-11-05

## 2021-06-13 MED ORDER — LEVOTHYROXINE SODIUM 100 MCG PO TABS: 100.0000 ug | ORAL_TABLET | Freq: Every day | ORAL | 1 refills | Status: DC

## 2021-06-13 MED ORDER — SPIRONOLACTONE 25 MG PO TABS: ORAL_TABLET | ORAL | 1 refills | Status: DC

## 2021-06-13 MED ORDER — AMLODIPINE BESYLATE 10 MG PO TABS: 10.0000 mg | ORAL_TABLET | Freq: Every day | ORAL | 1 refills | Status: DC

## 2021-08-07 ENCOUNTER — Ambulatory Visit (INDEPENDENT_AMBULATORY_CARE_PROVIDER_SITE_OTHER): Payer: Medicare Other

## 2021-08-07 ENCOUNTER — Encounter: Payer: Self-pay | Admitting: Internal Medicine

## 2021-08-07 ENCOUNTER — Other Ambulatory Visit: Payer: Self-pay

## 2021-08-07 DIAGNOSIS — Z23 Encounter for immunization: Secondary | ICD-10-CM | POA: Diagnosis not present

## 2021-09-04 ENCOUNTER — Other Ambulatory Visit: Payer: Self-pay

## 2021-09-04 ENCOUNTER — Ambulatory Visit (INDEPENDENT_AMBULATORY_CARE_PROVIDER_SITE_OTHER): Payer: Medicare Other | Admitting: Internal Medicine

## 2021-09-04 ENCOUNTER — Encounter: Payer: Self-pay | Admitting: Internal Medicine

## 2021-09-04 VITALS — BP 126/60 | HR 55 | Temp 98.1°F | Resp 18 | Ht 72.0 in | Wt 268.5 lb

## 2021-09-04 DIAGNOSIS — Z23 Encounter for immunization: Secondary | ICD-10-CM | POA: Diagnosis not present

## 2021-09-04 DIAGNOSIS — I10 Essential (primary) hypertension: Secondary | ICD-10-CM | POA: Diagnosis not present

## 2021-09-04 DIAGNOSIS — R7989 Other specified abnormal findings of blood chemistry: Secondary | ICD-10-CM | POA: Diagnosis not present

## 2021-09-04 DIAGNOSIS — R739 Hyperglycemia, unspecified: Secondary | ICD-10-CM

## 2021-09-04 DIAGNOSIS — C61 Malignant neoplasm of prostate: Secondary | ICD-10-CM

## 2021-09-04 DIAGNOSIS — E039 Hypothyroidism, unspecified: Secondary | ICD-10-CM | POA: Diagnosis not present

## 2021-09-04 DIAGNOSIS — E785 Hyperlipidemia, unspecified: Secondary | ICD-10-CM

## 2021-09-04 LAB — LIPID PANEL
Cholesterol: 180 mg/dL (ref 0–200)
HDL: 40.8 mg/dL (ref >39.00)
NonHDL: 138.7
Total CHOL/HDL Ratio: 4
Triglycerides: 210 mg/dL — ABNORMAL HIGH (ref 0.0–149.0)
VLDL: 42 mg/dL — ABNORMAL HIGH (ref 0.0–40.0)

## 2021-09-04 LAB — COMPREHENSIVE METABOLIC PANEL
ALT: 102 U/L — ABNORMAL HIGH (ref 0–53)
AST: 63 U/L — ABNORMAL HIGH (ref 0–37)
Albumin: 4.5 g/dL (ref 3.5–5.2)
Alkaline Phosphatase: 77 U/L (ref 39–117)
BUN: 11 mg/dL (ref 6–23)
CO2: 23 mEq/L (ref 19–32)
Calcium: 9.4 mg/dL (ref 8.4–10.5)
Chloride: 104 mEq/L (ref 96–112)
Creatinine, Ser: 0.7 mg/dL (ref 0.40–1.50)
GFR: 98.46 mL/min (ref >60.00)
Glucose, Bld: 110 mg/dL — ABNORMAL HIGH (ref 70–99)
Potassium: 4.2 mEq/L (ref 3.5–5.1)
Sodium: 137 mEq/L (ref 135–145)
Total Bilirubin: 0.5 mg/dL (ref 0.2–1.2)
Total Protein: 7.7 g/dL (ref 6.0–8.3)

## 2021-09-04 LAB — LDL CHOLESTEROL, DIRECT: Direct LDL: 115 mg/dL

## 2021-09-04 LAB — TSH: TSH: 2.95 u[IU]/mL (ref 0.35–5.50)

## 2021-09-04 MED ORDER — NITROGLYCERIN 0.4 MG SL SUBL: 0.4000 mg | SUBLINGUAL_TABLET | SUBLINGUAL | 6 refills | Status: DC | PRN

## 2021-09-04 NOTE — Patient Instructions (Addendum)
Proceed with a COVID booster  Always carry nitroglycerin with you, get a fresh supply of nitroglycerin every 6 months  Continue checking your blood pressures BP GOAL is between 110/65 and  135/85. If it is consistently higher or lower, let me know    GO TO THE LAB : Get the blood work     [place], [place] back for a physical exam by May 2022

## 2021-09-04 NOTE — Progress Notes (Signed)
Subjective:    Patient ID: Gary [name], male    DOB: 12/01/1955, 65 y.o.   MRN: [id number]  DOS:  09/04/2021 Type of visit - description: f/u  Since the last office visit he is doing well. Today we talk about high blood pressure, high cholesterol, vaccinations, CAD.  He remains asymptomatic with no chest pain no difficulty breathing. Has not used nitroglycerin   Still drinks alcohol sometimes.   BP Readings from Last 3 Encounters:  09/04/21 126/60  03/01/21 (!) 145/83  12/13/20 133/73     Review of Systems See above   Past Medical History:  Diagnosis Date   Anxiety    CAD (coronary artery disease) 10-2012   h/o stents    Charcot-Marie disease 07/28/2019   Elevated PSA    Prostate Bx w/ Dr. Tresa [name]   EtOH dependence [facility])    Family history of adverse reaction to anesthesia    made father disoriented   Gross hematuria    Hepatic steatosis    Hyperlipidemia LDL goal < 70    Hypertension    Metastatic adenocarcinoma to prostate ([place]) 2017   Gleason score 9, metastasis to R external Iliac node and large extraprostatic extension at prostatectomy   Multiple sclerosis ([place]) 1991   Bilateral optic neuritis, treated and resolved but felt to be a manifestation of MS.   Renal cyst, left    Bosniak 2K (indeterminant) hyperdense cyst CT 2014, Stable CT in 2016   Ulcerative colitis [facility])     Past Surgical History:  Procedure Laterality Date   FOOT SURGERY     R toe Fx (screws),  L dorsal FX (screws)   LEFT HEART CATHETERIZATION WITH CORONARY ANGIOGRAM N/A 10/22/2012   Procedure: LEFT HEART CATHETERIZATION WITH CORONARY ANGIOGRAM;  Surgeon: Sinclair Grooms, MD;  Location: [facility] CATH LAB;  Service: Cardiovascular;  Laterality: N/A;   LYMPHADENECTOMY Bilateral 11/24/2015   Procedure: BILATERAL PELVIC LYMPHADENECTOMY;  Surgeon: Alexis Frock, MD;  Location: [facility] ORS;  Service: Urology;  Laterality: Bilateral;   PERCUTANEOUS CORONARY STENT INTERVENTION (PCI-S) N/A 10/23/2012    Procedure: PERCUTANEOUS CORONARY STENT INTERVENTION (PCI-S);  Surgeon: Sinclair Grooms, MD;  Location[facility] CATH LAB;  Service: Cardiovascular;  Laterality: N/A;   PROSTATE BIOPSY  09/2015   Dr. Tresa [name]   ROBOT ASSISTED LAPAROSCOPIC RADICAL PROSTATECTOMY N/A 11/24/2015   Procedure: XI ROBOTIC ASSISTED LAPAROSCOPIC RADICAL PROSTATECTOMY WITH INDOCYANINE GREEN DYE;  Surgeon: Alexis Frock, MD;  Location: [facility] ORS;  Service: Urology;  Laterality: N/A;   VASECTOMY      Allergies as of 09/04/2021   No Known Allergies      Medication List        Accurate as of September 04, 2021 10:46 AM. If you have any questions, ask your nurse or doctor.          STOP taking these medications    hydrocortisone 100 MG/60ML enema Commonly known as: CORTENEMA Stopped by: [name], MD   Pfizer-BioNT COVID-19 Vac-TriS Susp injection Generic drug: COVID-19 mRNA Vac-TriS Therapist, music) Stopped by: [name], MD       TAKE these medications    abiraterone acetate 250 MG tablet Commonly known as: ZYTIGA Take 4 tablets by mouth once per day on an empty stomach, 1 hour before or 2 hours after a meal.   amLODipine 10 MG tablet Commonly known as: NORVASC Take 1 tablet (10 mg total) by mouth daily.   aspirin 81 MG tablet Take 81 mg by mouth daily.  atorvastatin 20 MG tablet Commonly known as: LIPITOR Take 1 tablet (20 mg total) by mouth daily.   carvedilol 25 MG tablet Commonly known as: COREG Take 1 tablet (25 mg total) by mouth 2 (two) times daily with a meal.   levothyroxine 100 MCG tablet Commonly known as: SYNTHROID Take 1 tablet (100 mcg total) by mouth daily before breakfast.   lisinopril 40 MG tablet Commonly known as: ZESTRIL Take 1 tablet (40 mg total) by mouth daily.   LUPRON IJ every 6 (six) months.   nitroGLYCERIN 0.4 MG SL tablet Commonly known as: NITROSTAT Place 1 tablet (0.4 mg total) under the tongue every 5 (five) minutes as needed for chest pain.   sertraline 100 MG  tablet Commonly known as: ZOLOFT Take 1 tablet (100 mg total) by mouth daily.   sildenafil 20 MG tablet Commonly known as: REVATIO Take 3-4 tablets (60-80 mg total) by mouth at bedtime as needed.   sodium chloride 0.65 % Soln nasal spray Commonly known as: OCEAN Place 1 spray into both nostrils as needed for congestion.   spironolactone 25 MG tablet Commonly known as: ALDACTONE TAKE 1/2 TABLET(12.5 MG) BY MOUTH DAILY   Ventolin HFA 108 (90 Base) MCG/ACT inhaler Generic drug: albuterol Can inhale two puffs every four to six hours as needed for cough or wheeze.           Objective:   Physical Exam BP 126/60 (BP Location: Left Arm, Patient Position: Sitting, Cuff Size: Normal)   Pulse (!) 55   Temp 98.1 F (36.7 C) (Oral)   Resp 18   Ht 6' (1.829 m)   Wt 268 lb 8 oz (121.8 kg)   SpO2 97%   BMI 36.42 kg/m  General:   Well developed, NAD, BMI noted. HEENT:  Normocephalic . Face symmetric, atraumatic Lungs:  CTA B Normal respiratory effort, no intercostal retractions, no accessory muscle use. Heart: RRR,  no murmur.  Lower extremities: no pretibial edema bilaterally  Skin: Not pale. Not jaundice Neurologic:  alert & oriented X3.  Speech normal, gait appropriate for age and unassisted Psych--  Cognition and judgment appear intact.  Cooperative with normal attention span and concentration.  Behavior appropriate. No anxious or depressed appearing.      Assessment    Assessment  Pre-diabetes HTN Hypokalemia: Saw endo 09/09/2019, w/u:no clear evidence of aldosterone producing tumor.  Spironolactone would be a good addition to her BP regimen Hyperlipidemia Hypothyroidism Anxiety CAD s/p stents 2014  EtOH dependency Ulcerative colitis- last cscope 05-2015 GU: -Metastatic prostate cancer, DX 11-2015, radical prostatectomy 11-2015 --  continent as off  07-2016--on androgen depravation  -Hematuria, saw urology 01-2013 reviewed: CT show a renal cyst, cystoscopy  negative, they recommended follow-up MRI for the renal cyst. NEURO: -MS  Dx 1991 (B neuritis, rx, resolved, felt to be d/t MS) -Bilateral thenar atrophy noted 06-2017 (apparently chronic) -Neuropathy: severe per  [facility] 07-2019 +, likely Charcot Mary Tooth Nose bleed: R nostril artery cauterize 08-2017 -TGA: DX 09/2018, MRI brain, EEG negative saw neurology in follow-up, carotid ultrasound ordered: 1 to 39% bilaterally.  PLAN Hyperglycemia: Check A1c HTN: BP today is very good, at home is never more than 140/80.  Continue amlodipine, carvedilol, lisinopril, Aldactone.  Check CMP. Hyperlipidemia: On Lipitor 20 mg, last LDL 87, recheck FLP, LDL goal less than 70 Hypothyroidism: Good med compliance, TSH. CAD: Asymptomatic, remind him to carry always NTG with him and to refresh it every 6 months. EtOH: Still drinking on and off, no  EtOH in the last 2 to 3 days per patient. Preventive care: PNM 23 today.  Recommend a COVID booster. RTC 02/2022 CPX   This visit occurred during the SARS-CoV-2 public health emergency.  Safety protocols were in place, including screening questions prior to the visit, additional usage of staff PPE, and extensive cleaning of exam room while observing appropriate contact time as indicated for disinfecting solutions.

## 2021-09-05 LAB — HEMOGLOBIN A1C: Hgb A1c MFr Bld: 6.2 % (ref 4.6–6.5)

## 2021-09-05 NOTE — Assessment & Plan Note (Signed)
Hyperglycemia: Check A1c HTN: BP today is very good, at home is never more than 140/80.  Continue amlodipine, carvedilol, lisinopril, Aldactone.  Check CMP. Hyperlipidemia: On Lipitor 20 mg, last LDL 87, recheck FLP, LDL goal less than 70 Hypothyroidism: Good med compliance, TSH. CAD: Asymptomatic, remind him to carry always NTG with him and to refresh it every 6 months. EtOH: Still drinking on and off, no EtOH in the last 2 to 3 days per patient. Preventive care: PNM 23 today.  Recommend a COVID booster. RTC 02/2022 CPX

## 2021-09-10 NOTE — Addendum Note (Signed)
Addended byDamita Dunnings D on: 09/10/2021 12:59 PM   Modules accepted: Orders

## 2021-09-11 ENCOUNTER — Other Ambulatory Visit (HOSPITAL_BASED_OUTPATIENT_CLINIC_OR_DEPARTMENT_OTHER): Payer: Self-pay

## 2021-09-11 ENCOUNTER — Ambulatory Visit (HOSPITAL_BASED_OUTPATIENT_CLINIC_OR_DEPARTMENT_OTHER)
Admission: RE | Admit: 2021-09-11 | Discharge: 2021-09-11 | Disposition: A | Discharge status: Home / Self Care | Payer: Medicare Other | Source: Ambulatory Visit | Attending: Internal Medicine | Admitting: Internal Medicine

## 2021-09-11 ENCOUNTER — Ambulatory Visit: Payer: Medicare Other | Attending: Internal Medicine

## 2021-09-11 ENCOUNTER — Other Ambulatory Visit: Payer: Self-pay

## 2021-09-11 DIAGNOSIS — C61 Malignant neoplasm of prostate: Secondary | ICD-10-CM | POA: Insufficient documentation

## 2021-09-11 DIAGNOSIS — R7989 Other specified abnormal findings of blood chemistry: Secondary | ICD-10-CM | POA: Insufficient documentation

## 2021-09-11 DIAGNOSIS — K760 Fatty (change of) liver, not elsewhere classified: Secondary | ICD-10-CM | POA: Diagnosis not present

## 2021-09-11 DIAGNOSIS — Z23 Encounter for immunization: Secondary | ICD-10-CM

## 2021-09-11 MED ORDER — PFIZER COVID-19 VAC BIVALENT 30 MCG/0.3ML IM SUSP
INTRAMUSCULAR | 0 refills | Status: DC
  Filled 2021-09-11: qty 0.3, 1d supply, fill #0

## 2021-10-30 LAB — PSA: PSA: <0.015

## 2021-11-05 ENCOUNTER — Other Ambulatory Visit: Payer: Self-pay | Admitting: Internal Medicine

## 2021-11-05 DIAGNOSIS — C775 Secondary and unspecified malignant neoplasm of intrapelvic lymph nodes: Secondary | ICD-10-CM | POA: Diagnosis not present

## 2021-11-06 ENCOUNTER — Encounter: Payer: Self-pay | Admitting: Internal Medicine

## 2021-12-12 ENCOUNTER — Inpatient Hospital Stay: Payer: Medicare Other | Admitting: Oncology

## 2021-12-12 ENCOUNTER — Inpatient Hospital Stay: Payer: Medicare Other

## 2022-03-07 ENCOUNTER — Encounter: Payer: Self-pay | Admitting: Internal Medicine

## 2022-03-07 ENCOUNTER — Ambulatory Visit (INDEPENDENT_AMBULATORY_CARE_PROVIDER_SITE_OTHER): Payer: Medicare Other | Admitting: Internal Medicine

## 2022-03-07 VITALS — BP 126/64 | HR 52 | Temp 97.8°F | Resp 16 | Ht 72.0 in | Wt 267.0 lb

## 2022-03-07 DIAGNOSIS — K5120 Ulcerative (chronic) proctitis without complications: Secondary | ICD-10-CM

## 2022-03-07 DIAGNOSIS — E039 Hypothyroidism, unspecified: Secondary | ICD-10-CM

## 2022-03-07 DIAGNOSIS — R7989 Other specified abnormal findings of blood chemistry: Secondary | ICD-10-CM

## 2022-03-07 DIAGNOSIS — I10 Essential (primary) hypertension: Secondary | ICD-10-CM | POA: Diagnosis not present

## 2022-03-07 DIAGNOSIS — Z0000 Encounter for general adult medical examination without abnormal findings: Secondary | ICD-10-CM | POA: Diagnosis not present

## 2022-03-07 DIAGNOSIS — Z0001 Encounter for general adult medical examination with abnormal findings: Secondary | ICD-10-CM

## 2022-03-07 DIAGNOSIS — R739 Hyperglycemia, unspecified: Secondary | ICD-10-CM | POA: Diagnosis not present

## 2022-03-07 DIAGNOSIS — E785 Hyperlipidemia, unspecified: Secondary | ICD-10-CM

## 2022-03-07 LAB — CBC WITH DIFFERENTIAL/PLATELET
Basophils Absolute: 0.1 10*3/uL (ref 0.0–0.1)
Basophils Relative: 1.1 % (ref 0.0–3.0)
Eosinophils Absolute: 0.2 10*3/uL (ref 0.0–0.7)
Eosinophils Relative: 3.1 % (ref 0.0–5.0)
HCT: 39.7 % (ref 39.0–52.0)
Hemoglobin: 13.5 g/dL (ref 13.0–17.0)
Lymphocytes Relative: 25.8 % (ref 12.0–46.0)
Lymphs Abs: 1.7 10*3/uL (ref 0.7–4.0)
MCHC: 34 g/dL (ref 30.0–36.0)
MCV: 85.9 fl (ref 78.0–100.0)
Monocytes Absolute: 0.4 10*3/uL (ref 0.1–1.0)
Monocytes Relative: 6.5 % (ref 3.0–12.0)
Neutro Abs: 4.2 10*3/uL (ref 1.4–7.7)
Neutrophils Relative %: 63.5 % (ref 43.0–77.0)
Platelets: 185 10*3/uL (ref 150.0–400.0)
RBC: 4.62 Mil/uL (ref 4.22–5.81)
RDW: 13.5 % (ref 11.5–15.5)
WBC: 6.7 10*3/uL (ref 4.0–10.5)

## 2022-03-07 LAB — LIPID PANEL
Cholesterol: 160 mg/dL (ref 0–200)
HDL: 39.9 mg/dL (ref >39.00)
NonHDL: 119.78
Total CHOL/HDL Ratio: 4
Triglycerides: 208 mg/dL — ABNORMAL HIGH (ref 0.0–149.0)
VLDL: 41.6 mg/dL — ABNORMAL HIGH (ref 0.0–40.0)

## 2022-03-07 LAB — COMPREHENSIVE METABOLIC PANEL
ALT: 83 U/L — ABNORMAL HIGH (ref 0–53)
AST: 49 U/L — ABNORMAL HIGH (ref 0–37)
Albumin: 4.4 g/dL (ref 3.5–5.2)
Alkaline Phosphatase: 90 U/L (ref 39–117)
BUN: 15 mg/dL (ref 6–23)
CO2: 24 mEq/L (ref 19–32)
Calcium: 9.7 mg/dL (ref 8.4–10.5)
Chloride: 104 mEq/L (ref 96–112)
Creatinine, Ser: 0.74 mg/dL (ref 0.40–1.50)
GFR: 96.48 mL/min (ref >60.00)
Glucose, Bld: 116 mg/dL — ABNORMAL HIGH (ref 70–99)
Potassium: 4.4 mEq/L (ref 3.5–5.1)
Sodium: 139 mEq/L (ref 135–145)
Total Bilirubin: 0.5 mg/dL (ref 0.2–1.2)
Total Protein: 7.6 g/dL (ref 6.0–8.3)

## 2022-03-07 LAB — TSH: TSH: 2.41 u[IU]/mL (ref 0.35–5.50)

## 2022-03-07 LAB — HEMOGLOBIN A1C: Hgb A1c MFr Bld: 6.2 % (ref 4.6–6.5)

## 2022-03-07 LAB — LDL CHOLESTEROL, DIRECT: Direct LDL: 94 mg/dL

## 2022-03-07 NOTE — Patient Instructions (Signed)
Check the  blood pressure regularly BP GOAL is between 110/65 and  135/85. If it is consistently higher or lower, let me know  Avoid excessive alcohol  GO TO THE LAB : Get the blood work     [place], [place] back for a checkup in 4 months     "Living will", "Grant of attorney": Advanced care planning  (If you already have a living will or healthcare power of attorney, please bring the copy to be scanned in your chart.)  Advance care planning is a process that supports adults in  understanding and sharing their preferences regarding future medical care.   The patient's preferences are recorded in documents called Advance Directives.    Advanced directives are completed (and can be modified at any time) while the patient is in full mental capacity.   The documentation should be available at all times to the patient, the family and the healthcare providers.  Bring in a copy to be scanned in your chart is an excellent idea and is recommended   This legal documents direct treatment decision making and/or appoint a surrogate to make the decision if the patient is not capable to do so.    Advance directives can be documented in many types of formats,  documents have names such as:  Lliving will  Durable power of attorney for healthcare (healthcare proxy or healthcare power of attorney)  Combined directives  Physician orders for life-sustaining treatment    More information at:  [email or web address]

## 2022-03-07 NOTE — Assessment & Plan Note (Signed)
-  Td 2015 - pnm 23-2015; prevnar--2017   -S/p Shingrix;  -covid vax: utd -CCS S/p colonoscopy (~ 2011?) showed adenomatous polyps Cscope 05-2015: mild erythema, no polyps Cscope 06-2019, Dr Cristina Gong  Flex sig: 08-2020 Referring to GI, see comments under ulcerative colitis -H/o Prostate cancer, f/u elsewhere Labs:  CMP, FLP, CBC, A1c, TSH POA: Discussed, info provided Counseled about diet-exercise

## 2022-03-07 NOTE — Assessment & Plan Note (Signed)
Here for CPX Prediabetes: Check A1c HTN: Reports ambulatory BPs are great, continue amlodipine, carvedilol, lisinopril, Aldactone.  Checking labs. Hyperlipidemia: Last LDL not at goal in the context of CAD.  Lipitor 20 mg not adjusted because at that time LFTs were elevated.  Recheck FLP and LFTs today. Increased LFTs:  Previous hep C serology negative, check hep B serology. Liver US December 2022 showed fatty liver and a increased vascularity structure of the gallbladder needing a follow-up ultrasound in 1 year. EtOH: Still drinks heavily on and off.  Abstinence or at least moderation strongly encourage Ulcerative colitis: From time to time has anorectal discomfort, he thinks related to hemorrhoids.  No diarrhea, he does have some mesalamine and he takes orally as needed.  Previous GI Dr. Cristina Gong retired, refer to [facility] to get established. Hypothyroidism: Check TSH Anxiety: on sertraline well-controlled CAD: Asymptomatic.  Continue aspirin, statins. Prostate cancer: Last visit with urology 11/05/2021. RTC 4 months

## 2022-03-07 NOTE — Progress Notes (Signed)
Subjective:    Patient ID: Gary [name], male    DOB: Dec 20, 1955, 66 y.o.   MRN: [id number]  DOS:  03/07/2022 Type of visit - description: cpx  Here for CPX. In general feeling well. He is now disabled, does not need to work thus stress has decreased. Denies chest pain or difficulty breathing Has a history of ulcerative colitis, from time to time he feels some anorectal discomfort with drops of blood in the toilet paper but symptoms quickly self resolve    Review of Systems  Other than above, a 14 point review of systems is negative    Past Medical History:  Diagnosis Date   Anxiety    CAD (coronary artery disease) 10-2012   h/o stents    Charcot-Marie disease 07/28/2019   Elevated PSA    Prostate Bx w/ Dr. Tresa [name]   EtOH dependence [facility])    Family history of adverse reaction to anesthesia    made father disoriented   Gross hematuria    Hepatic steatosis    Hyperlipidemia LDL goal < 70    Hypertension    Metastatic adenocarcinoma to prostate ([place]) 2017   Gleason score 9, metastasis to R external Iliac node and large extraprostatic extension at prostatectomy   Multiple sclerosis ([place]) 1991   Bilateral optic neuritis, treated and resolved but felt to be a manifestation of MS.   Renal cyst, left    Bosniak 2K (indeterminant) hyperdense cyst CT 2014, Stable CT in 2016   Ulcerative colitis [facility])     Past Surgical History:  Procedure Laterality Date   FOOT SURGERY     R toe Fx (screws),  L dorsal FX (screws)   LEFT HEART CATHETERIZATION WITH CORONARY ANGIOGRAM N/A 10/22/2012   Procedure: LEFT HEART CATHETERIZATION WITH CORONARY ANGIOGRAM;  Surgeon: Sinclair Grooms, MD;  Location: [facility] CATH LAB;  Service: Cardiovascular;  Laterality: N/A;   LYMPHADENECTOMY Bilateral 11/24/2015   Procedure: BILATERAL PELVIC LYMPHADENECTOMY;  Surgeon: Alexis Frock, MD;  Location[facility];  Service: Urology;  Laterality: Bilateral;   PERCUTANEOUS CORONARY STENT INTERVENTION (PCI-S) N/A  10/23/2012   Procedure: PERCUTANEOUS CORONARY STENT INTERVENTION (PCI-S);  Surgeon: Sinclair Grooms, MD;  Location[facility] CATH LAB;  Service: Cardiovascular;  Laterality: N/A;   PROSTATE BIOPSY  09/2015   Dr. Tresa [name]   ROBOT ASSISTED LAPAROSCOPIC RADICAL PROSTATECTOMY N/A 11/24/2015   Procedure: XI ROBOTIC ASSISTED LAPAROSCOPIC RADICAL PROSTATECTOMY WITH INDOCYANINE GREEN DYE;  Surgeon: Alexis Frock, MD;  Location: [facility];  Service: Urology;  Laterality: N/A;   VASECTOMY     Social History   Socioeconomic History   Marital status: Widowed    Spouse name: Not on file   Number of children: 1   Years of education: Not on file   Highest education level: Not on file  Occupational History   Occupation: disable   Tobacco Use   Smoking status: Former    Types: Cigarettes    Quit date: 07/18/1990    Years since quitting: 31.6   Smokeless tobacco: Former   Tobacco comments:    1991  Substance and Sexual Activity   Alcohol use: Yes    Comment: no qd but occ heavy   Drug use: Yes    Types: Marijuana   Sexual activity: Not on file  Other Topics Concern   Not on file  Social History Narrative   Lives by himself    Has an adult son   Social Determinants of Engineer, drilling  Resource Strain: Not on file  Food Insecurity: Not on file  Transportation Needs: Not on file  Physical Activity: Not on file  Stress: Not on file  Social Connections: Not on file  Intimate Partner Violence: Not on file    Current Outpatient Medications  Medication Instructions   abiraterone acetate (ZYTIGA) 250 MG tablet Take 4 tablets by mouth once per day on an empty stomach, 1 hour before or 2 hours after a meal.   amLODipine (NORVASC) 10 MG tablet TAKE 1 TABLET BY MOUTH  DAILY   aspirin 81 mg, Oral, Daily   atorvastatin (LIPITOR) 20 MG tablet TAKE 1 TABLET BY MOUTH  DAILY   carvedilol (COREG) 25 MG tablet TAKE 1 TABLET BY MOUTH  TWICE DAILY WITH A MEAL   Leuprolide Acetate (LUPRON IJ) Every 6 months    levothyroxine (SYNTHROID) 100 MCG tablet TAKE 1 TABLET BY MOUTH  DAILY BEFORE BREAKFAST   lisinopril (ZESTRIL) 40 MG tablet TAKE 1 TABLET BY MOUTH  DAILY   nitroGLYCERIN (NITROSTAT) 0.4 mg, Sublingual, Every 5 min x3 PRN   sertraline (ZOLOFT) 100 MG tablet TAKE 1 TABLET BY MOUTH  DAILY   sildenafil (REVATIO) 60-80 mg, Oral, At bedtime PRN   sodium chloride (OCEAN) 0.65 % SOLN nasal spray 1 spray, Each Nare, As needed   spironolactone (ALDACTONE) 25 MG tablet TAKE ONE-HALF TABLET BY  MOUTH DAILY       Objective:   Physical Exam BP 126/64   Pulse (!) 52   Temp 97.8 F (36.6 C) (Oral)   Resp 16   Ht 6' (1.829 m)   Wt 267 lb (121.1 kg)   SpO2 95%   BMI 36.21 kg/m  General: Well developed, NAD, BMI noted Neck: No  thyromegaly  HEENT:  Normocephalic . Face symmetric, atraumatic Lungs:  CTA B Normal respiratory effort, no intercostal retractions, no accessory muscle use. Heart: RRR,  no murmur.  Abdomen:  Not distended, soft, non-tender. No rebound or rigidity.   Lower extremities: no pretibial edema bilaterally  Skin: Exposed areas without rash. Not pale. Not jaundice Neurologic:  alert & oriented X3.  Speech normal, gait appropriate for age and unassisted Strength symmetric and appropriate for age.  Psych: Cognition and judgment appear intact.  Cooperative with normal attention span and concentration.  Behavior appropriate. No anxious or depressed appearing.     Assessment     Assessment  Pre-diabetes HTN Hypokalemia: Saw endo 09/09/2019, w/u:no clear evidence of aldosterone producing tumor.  Spironolactone would be a good addition to her BP regimen Hyperlipidemia Hypothyroidism Anxiety CAD s/p stents 2014  EtOH dependency Ulcerative colitis- last cscope 05-2015 GU: -Metastatic prostate cancer, DX 11-2015, radical prostatectomy 11-2015 --  continent as off  07-2016--on androgen depravation  -Hematuria, saw urology 01-2013 reviewed: CT show a renal cyst, cystoscopy  negative, they recommended follow-up MRI for the renal cyst. NEURO: -MS  Dx 1991 (B neuritis, rx, resolved, felt to be d/t MS) -Bilateral thenar atrophy noted 06-2017 (apparently chronic) -Neuropathy: severe per  [facility] 07-2019 +, likely Charcot Mary Tooth Nose bleed: R nostril artery cauterize 08-2017 -TGA: DX 09/2018, MRI brain, EEG negative saw neurology in follow-up, carotid ultrasound ordered: 1 to 39% bilaterally.  PLAN Here for CPX Prediabetes: Check A1c HTN: Reports ambulatory BPs are great, continue amlodipine, carvedilol, lisinopril, Aldactone.  Checking labs. Hyperlipidemia: Last LDL not at goal in the context of CAD.  Lipitor 20 mg not adjusted because at that time LFTs were elevated.  Recheck FLP and LFTs today.  Increased LFTs:  Previous hep C serology negative, check hep B serology. Liver US December 2022 showed fatty liver and a increased vascularity structure of the gallbladder needing a follow-up ultrasound in 1 year. EtOH: Still drinks heavily on and off.  Abstinence or at least moderation strongly encourage Ulcerative colitis: From time to time has anorectal discomfort, he thinks related to hemorrhoids.  No diarrhea, he does have some mesalamine and he takes orally as needed.  Previous GI Dr. Cristina Gong retired, refer to [facility] to get established. Hypothyroidism: Check TSH Anxiety: on sertraline well-controlled CAD: Asymptomatic.  Continue aspirin, statins. Prostate cancer: Last visit with urology 11/05/2021. RTC 4 months   In addition to CPX, his multiple chronic medical problems were evaluated

## 2022-03-08 LAB — HEPATITIS B SURFACE ANTIGEN: Hepatitis B Surface Ag: NONREACTIVE

## 2022-03-08 LAB — HEPATITIS B CORE ANTIBODY, TOTAL: Hep B Core Total Ab: NONREACTIVE

## 2022-03-08 LAB — HEPATITIS B SURFACE ANTIBODY,QUALITATIVE: Hep B S Ab: NONREACTIVE

## 2022-03-13 MED ORDER — EZETIMIBE 10 MG PO TABS: 10.0000 mg | ORAL_TABLET | Freq: Every day | ORAL | 3 refills | Status: DC

## 2022-03-13 NOTE — Addendum Note (Signed)
Addended byDamita Dunnings D on: 03/13/2022 07:49 AM   Modules accepted: Orders

## 2022-03-19 ENCOUNTER — Ambulatory Visit (INDEPENDENT_AMBULATORY_CARE_PROVIDER_SITE_OTHER): Payer: Medicare Other

## 2022-03-19 VITALS — Ht 72.0 in | Wt 267.0 lb

## 2022-03-19 DIAGNOSIS — Z0000 Encounter for general adult medical examination without abnormal findings: Secondary | ICD-10-CM | POA: Diagnosis not present

## 2022-03-19 NOTE — Patient Instructions (Signed)
Gary [name] , Thank you for taking time to come for your Medicare Wellness Visit. I appreciate your ongoing commitment to your health goals. Please review the following plan we discussed and let me know if I can assist you in the future.   Screening recommendations/referrals: Colonoscopy: 07/06/19 due 07/05/26 Recommended yearly ophthalmology/optometry visit for glaucoma screening and checkup Recommended yearly dental visit for hygiene and checkup  Vaccinations: Influenza vaccine: up to date Pneumococcal vaccine: up date Tdap vaccine: up to date Shingles vaccine: up to date   Covid-19: completed  Advanced directives: yes, not on file  Conditions/risks identified: see problem list   Next appointment: Follow up in one year for your annual wellness visit   Preventive Care 40-64 Years, Male Preventive care refers to lifestyle choices and visits with your health care provider that can promote health and wellness. What does preventive care include? A yearly physical exam. This is also called an annual well check. Dental exams once or twice a year. Routine eye exams. Ask your health care provider how often you should have your eyes checked. Personal lifestyle choices, including: Daily care of your teeth and gums. Regular physical activity. Eating a healthy diet. Avoiding tobacco and drug use. Limiting alcohol use. Practicing safe sex. Taking low-dose aspirin every day starting at age 57. What happens during an annual well check? The services and screenings done by your health care provider during your annual well check will depend on your age, overall health, lifestyle risk factors, and family history of disease. Counseling  Your health care provider may ask you questions about your: Alcohol use. Tobacco use. Drug use. Emotional well-being. Home and relationship well-being. Sexual activity. Eating habits. Work and work Statistician. Screening  You may have the following tests or  measurements: Height, weight, and BMI. Blood pressure. Lipid and cholesterol levels. These may be checked every 5 years, or more frequently if you are over 24 years old. Skin check. Lung cancer screening. You may have this screening every year starting at age 49 if you have a 30-pack-year history of smoking and currently smoke or have quit within the past 15 years. Fecal occult blood test (FOBT) of the stool. You may have this test every year starting at age 22. Flexible sigmoidoscopy or colonoscopy. You may have a sigmoidoscopy every 5 years or a colonoscopy every 10 years starting at age 9. Prostate cancer screening. Recommendations will vary depending on your family history and other risks. Hepatitis C blood test. Hepatitis B blood test. Sexually transmitted disease (STD) testing. Diabetes screening. This is done by checking your blood sugar (glucose) after you have not eaten for a while (fasting). You may have this done every 1-3 years. Discuss your test results, treatment options, and if necessary, the need for more tests with your health care provider. Vaccines  Your health care provider may recommend certain vaccines, such as: Influenza vaccine. This is recommended every year. Tetanus, diphtheria, and acellular pertussis (Tdap, Td) vaccine. You may need a Td booster every 10 years. Zoster vaccine. You may need this after age 3. Pneumococcal 13-valent conjugate (PCV13) vaccine. You may need this if you have certain conditions and have not been vaccinated. Pneumococcal polysaccharide (PPSV23) vaccine. You may need one or two doses if you smoke cigarettes or if you have certain conditions. Talk to your health care provider about which screenings and vaccines you need and how often you need them. This information is not intended to replace advice given to you by your health care  provider. Make sure you discuss any questions you have with your health care provider. Document Released:  10/20/2015 Document Revised: 06/12/2016 Document Reviewed: 07/25/2015 Elsevier Interactive Patient Education  2017 [place] Prevention in the Home Falls can cause injuries. They can happen to people of all ages. There are many things you can do to make your home safe and to help prevent falls. What can I do on the outside of my home? Regularly fix the edges of walkways and driveways and fix any cracks. Remove anything that might make you trip as you walk through a door, such as a raised step or threshold. Trim any bushes or trees on the path to your home. Use bright outdoor lighting. Clear any walking paths of anything that might make someone trip, such as rocks or tools. Regularly check to see if handrails are loose or broken. Make sure that both sides of any steps have handrails. Any raised decks and porches should have guardrails on the edges. Have any leaves, snow, or ice cleared regularly. Use sand or salt on walking paths during winter. Clean up any spills in your garage right away. This includes oil or grease spills. What can I do in the bathroom? Use night lights. Install grab bars by the toilet and in the tub and shower. Do not use towel bars as grab bars. Use non-skid mats or decals in the tub or shower. If you need to sit down in the shower, use a plastic, non-slip stool. Keep the floor dry. Clean up any water that spills on the floor as soon as it happens. Remove soap buildup in the tub or shower regularly. Attach bath mats securely with double-sided non-slip rug tape. Do not have throw rugs and other things on the floor that can make you trip. What can I do in the bedroom? Use night lights. Make sure that you have a light by your bed that is easy to reach. Do not use any sheets or blankets that are too big for your bed. They should not hang down onto the floor. Have a firm chair that has side arms. You can use this for support while you get dressed. Do not have  throw rugs and other things on the floor that can make you trip. What can I do in the kitchen? Clean up any spills right away. Avoid walking on wet floors. Keep items that you use a lot in easy-to-reach places. If you need to reach something above you, use a strong step stool that has a grab bar. Keep electrical cords out of the way. Do not use floor polish or wax that makes floors slippery. If you must use wax, use non-skid floor wax. Do not have throw rugs and other things on the floor that can make you trip. What can I do with my stairs? Do not leave any items on the stairs. Make sure that there are handrails on both sides of the stairs and use them. Fix handrails that are broken or loose. Make sure that handrails are as long as the stairways. Check any carpeting to make sure that it is firmly attached to the stairs. Fix any carpet that is loose or worn. Avoid having throw rugs at the top or bottom of the stairs. If you do have throw rugs, attach them to the floor with carpet tape. Make sure that you have a light switch at the top of the stairs and the bottom of the stairs. If you do not have  them, ask someone to add them for you. What else can I do to help prevent falls? Wear shoes that: Do not have high heels. Have rubber bottoms. Are comfortable and fit you well. Are closed at the toe. Do not wear sandals. If you use a stepladder: Make sure that it is fully opened. Do not climb a closed stepladder. Make sure that both sides of the stepladder are locked into place. Ask someone to hold it for you, if possible. Clearly mark and make sure that you can see: Any grab bars or handrails. First and last steps. Where the edge of each step is. Use tools that help you move around (mobility aids) if they are needed. These include: Canes. Walkers. Scooters. Crutches. Turn on the lights when you go into a dark area. Replace any light bulbs as soon as they burn out. Set up your furniture so  you have a clear path. Avoid moving your furniture around. If any of your floors are uneven, fix them. If there are any pets around you, be aware of where they are. Review your medicines with your doctor. Some medicines can make you feel dizzy. This can increase your chance of falling. Ask your doctor what other things that you can do to help prevent falls. This information is not intended to replace advice given to you by your health care provider. Make sure you discuss any questions you have with your health care provider. Document Released: 07/20/2009 Document Revised: 02/29/2016 Document Reviewed: 10/28/2014 Elsevier Interactive Patient Education  2017 [employer].

## 2022-03-19 NOTE — Progress Notes (Signed)
Subjective:   Gary [name] is a 66 y.o. male who presents for an Initial Medicare Annual Wellness Visit.  I connected with  Andrew Au on 03/19/22 by a audio enabled telemedicine application and verified that I am speaking with the correct person using two identifiers.  Patient Location: Home  Provider Location: Office/Clinic  I discussed the limitations of evaluation and management by telemedicine. The patient expressed understanding and agreed to proceed.   Review of Systems     Cardiac Risk Factors include: advanced age (>42mn, >>77women);obesity (BMI >30kg/m2);hypertension;dyslipidemia     Objective:    Today's Vitals   03/19/22 1555  Weight: 267 lb (121.1 kg)  Height: 6' (1.829 m)   Body mass index is 36.21 kg/m.     03/19/2022    3:51 PM 12/14/2019   10:20 AM 09/18/2018    1:21 PM 09/15/2018    5:43 PM 08/16/2017   10:26 PM 08/01/2017    1:20 PM 07/26/2017    8:43 AM  Advanced Directives  Does Patient Have a Medical Advance Directive? Yes No  No No No No  Type of AParamedicof ADraperOut of facility DNR (pink MOST or yellow form);Living will        Copy of [place] Chart? No - copy requested        Would patient like information on creating a medical advance directive?  No - Patient declined  No - Patient declined No - Patient declined       Information is confidential and restricted. Go to Review Flowsheets to unlock data.    Current Medications (verified) Outpatient Encounter Medications as of 03/19/2022  Medication Sig   abiraterone acetate (ZYTIGA) 250 MG tablet Take 4 tablets by mouth once per day on an empty stomach, 1 hour before or 2 hours after a meal.   amLODipine (NORVASC) 10 MG tablet TAKE 1 TABLET BY MOUTH  DAILY   aspirin 81 MG tablet Take 81 mg by mouth daily.   atorvastatin (LIPITOR) 20 MG tablet TAKE 1 TABLET BY MOUTH  DAILY   carvedilol (COREG) 25 MG tablet TAKE 1 TABLET BY MOUTH   TWICE DAILY WITH A MEAL   ezetimibe (ZETIA) 10 MG tablet Take 1 tablet (10 mg total) by mouth daily.   Leuprolide Acetate (LUPRON IJ) every 6 (six) months.   levothyroxine (SYNTHROID) 100 MCG tablet TAKE 1 TABLET BY MOUTH  DAILY BEFORE BREAKFAST   lisinopril (ZESTRIL) 40 MG tablet TAKE 1 TABLET BY MOUTH  DAILY   nitroGLYCERIN (NITROSTAT) 0.4 MG SL tablet Place 1 tablet (0.4 mg total) under the tongue every 5 (five) minutes x 3 doses as needed for chest pain.   sertraline (ZOLOFT) 100 MG tablet TAKE 1 TABLET BY MOUTH  DAILY   sildenafil (REVATIO) 20 MG tablet Take 3-4 tablets (60-80 mg total) by mouth at bedtime as needed.   sodium chloride (OCEAN) 0.65 % SOLN nasal spray Place 1 spray into both nostrils as needed for congestion.   spironolactone (ALDACTONE) 25 MG tablet TAKE ONE-HALF TABLET BY  MOUTH DAILY   No facility-administered encounter medications on file as of 03/19/2022.    Allergies (verified) Patient has no known allergies.   History: Past Medical History:  Diagnosis Date   Anxiety    CAD (coronary artery disease) 10-2012   h/o stents    Charcot-Marie disease 07/28/2019   Elevated PSA    Prostate Bx w/ Dr. MTresa [name]  EtOH dependence (  [place])    Family history of adverse reaction to anesthesia    made father disoriented   Gross hematuria    Hepatic steatosis    Hyperlipidemia LDL goal < 70    Hypertension    Metastatic adenocarcinoma to prostate ([place]) 2017   Gleason score 9, metastasis to R external Iliac node and large extraprostatic extension at prostatectomy   Multiple sclerosis ([place]) 1991   Bilateral optic neuritis, treated and resolved but felt to be a manifestation of MS.   Renal cyst, left    Bosniak 2K (indeterminant) hyperdense cyst CT 2014, Stable CT in 2016   Ulcerative colitis [facility])    Past Surgical History:  Procedure Laterality Date   FOOT SURGERY     R toe Fx (screws),  L dorsal FX (screws)   LEFT HEART CATHETERIZATION WITH CORONARY ANGIOGRAM N/A  10/22/2012   Procedure: LEFT HEART CATHETERIZATION WITH CORONARY ANGIOGRAM;  Surgeon: Sinclair Grooms, MD;  Location: [facility] CATH LAB;  Service: Cardiovascular;  Laterality: N/A;   LYMPHADENECTOMY Bilateral 11/24/2015   Procedure: BILATERAL PELVIC LYMPHADENECTOMY;  Surgeon: Alexis Frock, MD;  Location: [facility] ORS;  Service: Urology;  Laterality: Bilateral;   PERCUTANEOUS CORONARY STENT INTERVENTION (PCI-S) N/A 10/23/2012   Procedure: PERCUTANEOUS CORONARY STENT INTERVENTION (PCI-S);  Surgeon: Sinclair Grooms, MD;  Location: [facility] CATH LAB;  Service: Cardiovascular;  Laterality: N/A;   PROSTATE BIOPSY  09/2015   Dr. Tresa [name]   ROBOT ASSISTED LAPAROSCOPIC RADICAL PROSTATECTOMY N/A 11/24/2015   Procedure: XI ROBOTIC ASSISTED LAPAROSCOPIC RADICAL PROSTATECTOMY WITH INDOCYANINE GREEN DYE;  Surgeon: Alexis Frock, MD;  Location: [facility] ORS;  Service: Urology;  Laterality: N/A;   VASECTOMY     Family History  Problem Relation Age of Onset   Dementia Mother    Hypertension Mother    Coronary artery disease Father    Diabetes Mellitus II Father    Lupus Sister    Coronary artery disease Brother    Diabetes Brother    Hypertension Brother    Prostate cancer Brother    Thyroid cancer Sister    Heart failure Sister    Colon cancer Neg Hx    Social History   Socioeconomic History   Marital status: Widowed    Spouse name: Not on file   Number of children: 1   Years of education: Not on file   Highest education level: Not on file  Occupational History   Occupation: disable   Tobacco Use   Smoking status: Former    Types: Cigarettes    Quit date: 07/18/1990    Years since quitting: 31.6   Smokeless tobacco: Former   Tobacco comments:    1991  Substance and Sexual Activity   Alcohol use: Yes    Comment: no qd but occ heavy   Drug use: Yes    Types: Marijuana   Sexual activity: Not on file  Other Topics Concern   Not on file  Social History Narrative   Lives by himself    Has an adult son    Social Determinants of Radio broadcast assistant Strain: Not on file  Food Insecurity: Not on file  Transportation Needs: Not on file  Physical Activity: Not on file  Stress: Not on file  Social Connections: Not on file    Tobacco Counseling Counseling given: Not Answered Tobacco comments: 1991   Clinical Intake:  Pre-visit preparation completed: Yes  Pain : No/denies pain     BMI - recorded: 36.21 Nutritional Status:  BMI > 30  Obese Nutritional Risks: None Diabetes: No  How often do you need to have someone help you when you read instructions, pamphlets, or other written materials from your doctor or pharmacy?: 1 - Never  Diabetic?No  Interpreter Needed?: No  Information entered by :: Marie of Daily Living    03/19/2022    3:58 PM  In your present state of health, do you have any difficulty performing the following activities:  Hearing? 0  Vision? 0  Difficulty concentrating or making decisions? 0  Walking or climbing stairs? 1  Dressing or bathing? 1  Doing errands, shopping? 0  Preparing Food and eating ? N  Using the Toilet? N  In the past six months, have you accidently leaked urine? Y  Do you have problems with loss of bowel control? Y  Managing your Medications? N  Managing your Finances? N  Housekeeping or managing your Housekeeping? N    Patient Care Team: Colon Branch, MD as PCP - General (Internal Medicine) Belva Crome, MD as Consulting Physician (Cardiology) Ronald Lobo, MD as Consulting Physician (Gastroenterology) Alexis Frock, MD as Consulting Physician (Urology) Wyatt Portela, MD as Consulting Physician (Oncology) Berle Mull, MD as Consulting Physician (Sports Medicine) Melissa Montane, MD as Consulting Physician (Otolaryngology)  Indicate any recent Medical Services you may have received from other than [facility] providers in the past year (date may be approximate).     Assessment:   This is a  routine wellness examination for [place].  Hearing/Vision screen No results found.  Dietary issues and exercise activities discussed: Current Exercise Habits: Home exercise routine, Type of exercise: Other - see comments, Time (Minutes): 20, Frequency (Times/Week): 7, Weekly Exercise (Minutes/Week): 140, Exercise limited by: None identified   Goals Addressed   None    Depression Screen    03/19/2022    3:52 PM 03/07/2022    9:54 AM 09/04/2021   11:09 AM 03/01/2021   10:53 AM 11/14/2020   10:44 AM 12/15/2019    9:08 AM 08/11/2019    9:00 AM  PHQ 2/9 Scores  PHQ - 2 Score 0 2 1 2  0 1 3  PHQ- 9 Score  7 5 5 5 4 7     Fall Risk    03/19/2022    3:51 PM 03/07/2022    9:07 AM 03/01/2021   10:32 AM 07/31/2016    9:20 AM 01/24/2016    9:35 AM  Fall Risk   Falls in the past year? 1 0 1 No No  Number falls in past yr: 1 0 1    Injury with Fall? 1 0 0    Risk for fall due to : Impaired balance/gait      Follow up Falls evaluation completed Falls evaluation completed       FALL RISK PREVENTION PERTAINING TO THE HOME:  Any stairs in or around the home? Yes  If so, are there any without handrails? No  Home free of loose throw rugs in walkways, pet beds, electrical cords, etc? Yes  Adequate lighting in your home to reduce risk of falls? Yes   ASSISTIVE DEVICES UTILIZED TO PREVENT FALLS:  Life alert? No  Use of a cane, walker or w/c? Yes  sometimes Grab bars in the bathroom? No  Shower chair or bench in shower? Yes  Elevated toilet seat or a handicapped toilet? Yes   TIMED UP AND GO:  Was the test performed? No .    Cognitive  Function:        03/19/2022    4:02 PM  6CIT Screen  What Year? 0 points  What month? 0 points  What time? 0 points  Count back from 20 0 points  Months in reverse 0 points  Repeat phrase 0 points  Total Score 0 points    Immunizations Immunization History  Administered Date(s) Administered   IPV 04/14/1990   Influenza Inj Mdck Quad With  Preservative 07/19/2018   Influenza Split 06/14/2015   Influenza,inj,Quad PF,6+ Mos 07/20/2014, 07/04/2015, 06/24/2017, 07/16/2019, 08/07/2021   Influenza,trivalent, recombinat, inj, PF 07/14/2012, 07/21/2013   Influenza-Unspecified 07/22/2016, 07/07/2020   PFIZER Comirnaty(Gray Top)Covid-19 Tri-Sucrose Vaccine 03/01/2021   PFIZER(Purple Top)SARS-COV-2 Vaccination 12/23/2019, 01/18/2020, 08/24/2020   Pfizer Covid-19 Vaccine Bivalent Booster 21yr & up 09/11/2021   Pneumococcal Conjugate-13 01/24/2016   Pneumococcal Polysaccharide-23 07/08/2014, 09/04/2021   Tetanus 07/08/2014   Zoster Recombinat (Shingrix) 07/19/2018, 10/14/2018    TDAP status: Up to date  Flu Vaccine status: Up to date  Pneumococcal vaccine status: Up to date  Covid-19 vaccine status: Completed vaccines  Qualifies for Shingles Vaccine? Yes   Zostavax completed No   Shingrix Completed?: Yes  Screening Tests Health Maintenance  Topic Date Due   INFLUENZA VACCINE  05/07/2022   TETANUS/TDAP  07/08/2024   COLONOSCOPY (Pts 45-477yrInsurance coverage will need to be confirmed)  07/05/2026   COVID-19 Vaccine  Completed   Hepatitis C Screening  Completed   HIV Screening  Completed   Zoster Vaccines- Shingrix  Completed   HPV VACCINES  Aged Out    Health Maintenance  There are no preventive care reminders to display for this patient.  Colorectal cancer screening: Type of screening: Colonoscopy. Completed 07/06/19. Repeat every 7 years  Lung Cancer Screening: (Low Dose CT Chest recommended if Age 64-80ears, 30 pack-year currently smoking OR have quit w/in 15years.) does not qualify.   Lung Cancer Screening Referral: n/a  Additional Screening:  Hepatitis C Screening: does qualify; Completed 07/12/15  Vision Screening: Recommended annual ophthalmology exams for early detection of glaucoma and other disorders of the eye. Is the patient up to date with their annual eye exam?  Yes  Who is the provider or what  is the name of the office in which the patient attends annual eye exams? [facility] If pt is not established with a provider, would they like to be referred to a provider to establish care? No .   Dental Screening: Recommended annual dental exams for proper oral hygiene  Community Resource Referral / Chronic Care Management: CRR required this visit?  No   CCM required this visit?  No      Plan:     I have personally reviewed and noted the following in the patient's chart:   Medical and social history Use of alcohol, tobacco or illicit drugs  Current medications and supplements including opioid prescriptions. Patient is not currently taking opioid prescriptions.Patient is not currently taking opioid prescriptions. Functional ability and status Nutritional status Physical activity Advanced directives List of other physicians Hospitalizations, surgeries, and ER visits in previous 12 months Vitals Screenings to include cognitive, depression, and falls Referrals and appointments  In addition, I have reviewed and discussed with patient certain preventive protocols, quality metrics, and best practice recommendations. A written personalized care plan for preventive services as well as general preventive health recommendations were provided to patient.     ShDuard Bradyhism, CMGretna 03/19/2022   Nurse Notes: none

## 2022-03-25 ENCOUNTER — Other Ambulatory Visit: Payer: Self-pay | Admitting: Internal Medicine

## 2022-05-23 ENCOUNTER — Encounter: Payer: Self-pay | Admitting: Internal Medicine

## 2022-06-14 ENCOUNTER — Encounter: Payer: Self-pay | Admitting: Internal Medicine

## 2022-06-23 ENCOUNTER — Other Ambulatory Visit: Payer: Self-pay | Admitting: Internal Medicine

## 2022-07-01 ENCOUNTER — Encounter: Payer: Self-pay | Admitting: Internal Medicine

## 2022-07-01 DIAGNOSIS — K51211 Ulcerative (chronic) proctitis with rectal bleeding: Secondary | ICD-10-CM | POA: Diagnosis not present

## 2022-07-01 DIAGNOSIS — R197 Diarrhea, unspecified: Secondary | ICD-10-CM | POA: Diagnosis not present

## 2022-07-02 DIAGNOSIS — R197 Diarrhea, unspecified: Secondary | ICD-10-CM | POA: Diagnosis not present

## 2022-07-02 DIAGNOSIS — K51211 Ulcerative (chronic) proctitis with rectal bleeding: Secondary | ICD-10-CM | POA: Diagnosis not present

## 2022-07-05 ENCOUNTER — Telehealth: Payer: Self-pay

## 2022-07-05 NOTE — Telephone Encounter (Signed)
[employer] NP called wanting to discuss abnormal findings with PCP.

## 2022-07-05 NOTE — Telephone Encounter (Signed)
Spoke with [facility] nurse. Yesterday, patient had a + peripheral vascular disease screen at home. Denies claudication Has palpable pulses. Plan: We will discuss the issue at the next opportunity

## 2022-08-14 ENCOUNTER — Ambulatory Visit (INDEPENDENT_AMBULATORY_CARE_PROVIDER_SITE_OTHER): Payer: Medicare Other | Admitting: Internal Medicine

## 2022-08-14 ENCOUNTER — Encounter: Payer: Self-pay | Admitting: Internal Medicine

## 2022-08-14 VITALS — BP 126/64 | HR 53 | Temp 97.5°F | Resp 18 | Ht 72.0 in | Wt 265.2 lb

## 2022-08-14 DIAGNOSIS — I10 Essential (primary) hypertension: Secondary | ICD-10-CM

## 2022-08-14 DIAGNOSIS — G35 Multiple sclerosis: Secondary | ICD-10-CM

## 2022-08-14 DIAGNOSIS — R296 Repeated falls: Secondary | ICD-10-CM | POA: Diagnosis not present

## 2022-08-14 DIAGNOSIS — G629 Polyneuropathy, unspecified: Secondary | ICD-10-CM

## 2022-08-14 DIAGNOSIS — E039 Hypothyroidism, unspecified: Secondary | ICD-10-CM

## 2022-08-14 DIAGNOSIS — E785 Hyperlipidemia, unspecified: Secondary | ICD-10-CM

## 2022-08-14 DIAGNOSIS — G600 Hereditary motor and sensory neuropathy: Secondary | ICD-10-CM

## 2022-08-14 LAB — LIPID PANEL
Cholesterol: 149 mg/dL (ref 0–200)
HDL: 41.7 mg/dL (ref >39.00)
LDL Cholesterol: 72 mg/dL (ref 0–99)
NonHDL: 107.24
Total CHOL/HDL Ratio: 4
Triglycerides: 176 mg/dL — ABNORMAL HIGH (ref 0.0–149.0)
VLDL: 35.2 mg/dL (ref 0.0–40.0)

## 2022-08-14 LAB — TSH: TSH: 1.68 u[IU]/mL (ref 0.35–5.50)

## 2022-08-14 LAB — ALT: ALT: 38 U/L (ref 0–53)

## 2022-08-14 LAB — AST: AST: 21 U/L (ref 0–37)

## 2022-08-14 MED ORDER — NITROGLYCERIN 0.4 MG SL SUBL: 0.4000 mg | SUBLINGUAL_TABLET | SUBLINGUAL | 3 refills | Status: DC | PRN

## 2022-08-14 NOTE — Patient Instructions (Signed)
GO TO THE LAB : Get the blood work     [place], [place] Come back for   checkup in 4  months    Foot Care Diabetes, also called diabetes mellitus, may cause problems with your feet and legs because of poor blood flow (circulation). Poor circulation may make your skin: Become thinner and drier. Break more easily. Heal more slowly. Peel and crack. You may also have nerve damage (neuropathy). This can cause decreased feeling in your legs and feet. This means that you may not notice minor injuries to your feet that could lead to more serious problems. Finding and treating problems early is the best way to prevent future foot problems. How to care for your feet Foot hygiene  Wash your feet daily with warm water and mild soap. Do not use hot water. Then, pat your feet and the areas between your toes until they are fully dry. Do not soak your feet. This can dry your skin. Trim your toenails straight across. Do not dig under them or around the cuticle. File the edges of your nails with an emery board or nail file. Apply a moisturizing lotion or petroleum jelly to the skin on your feet and to dry, brittle toenails. Use lotion that does not contain alcohol and is unscented. Do not apply lotion between your toes. Shoes and socks Wear clean socks or stockings every day. Make sure they are not too tight. Do not wear knee-high stockings. These may decrease blood flow to your legs. Wear shoes that fit well and have enough cushioning. Always look in your shoes before you put them on to be sure there are no objects inside. To break in new shoes, wear them for just a few hours a day. This prevents injuries on your feet. Wounds, scrapes, corns, and calluses  Check your feet daily for blisters, cuts, bruises, sores, and redness. If you cannot see the bottom of your feet, use a mirror or ask someone for help. Do not cut off corns or calluses or try to remove them  with medicine. If you find a minor scrape, cut, or break in the skin on your feet, keep it and the skin around it clean and dry. You may clean these areas with mild soap and water. Do not clean the area with peroxide, alcohol, or iodine. If you have a wound, scrape, corn, or callus on your foot, look at it several times a day to make sure it is healing and not infected. Check for: Redness, swelling, or pain. Fluid or blood. Warmth. Pus or a bad smell. General tips Do not cross your legs. This may decrease blood flow to your feet. Do not use heating pads or hot water bottles on your feet. They may burn your skin. If you have lost feeling in your feet or legs, you may not know this is happening until it is too late. Protect your feet from hot and cold by wearing shoes, such as at the beach or on hot pavement. Schedule a complete foot exam at least once a year or more often if you have foot problems. Report any cuts, sores, or bruises to your health care provider right away. Where to find more information American Diabetes Association: diabetes.org Association of Diabetes Care & Education Specialists: diabeteseducator.org Contact a health care provider if: You have a condition that increases your risk of infection, and you have any cuts, sores, or bruises on your feet.  You have an injury that is not healing. You have redness on your legs or feet. You feel burning or tingling in your legs or feet. You have pain or cramps in your legs and feet. Your legs or feet are numb. Your feet always feel cold. You have pain around any toenails. Get help right away if: You have a wound, scrape, corn, or callus on your foot and: You have signs of infection. You have a fever. You have a red line going up your leg. This information is not intended to replace advice given to you by your health care provider. Make sure you discuss any questions you have with your health care provider. Document Revised:  03/27/2022 Document Reviewed: 03/27/2022 Elsevier Patient Education  [place].

## 2022-08-14 NOTE — Progress Notes (Unsigned)
Subjective:    Patient ID: Gary [name], male    DOB: 05/07/1956, 66 y.o.   MRN: [id number]  DOS:  08/14/2022 Type of visit - description: Follow-up  Here for follow-up Since the last office visit, he developed E. coli stomach infection on COVID-19 after he came back from a trip abroad. Saw GI. Currently no GI symptoms.  Zetia was added to his regimen based on the last labs, good compliance.  No apparent side effects.  Reports several falls lately, no major injuries.  Uses a cane, not consistently so.  He thinks falls are related to neuropathy and DJD of the knees.  Request a parking permit  Had + a screening test for peripheral vascular disease.  Denies claudication per se.   Review of Systems See above   Past Medical History:  Diagnosis Date   Anxiety    CAD (coronary artery disease) 10-2012   h/o stents    Charcot-Marie disease 07/28/2019   Elevated PSA    Prostate Bx w/ Dr. Tresa [name]   EtOH dependence [facility])    Family history of adverse reaction to anesthesia    made father disoriented   Gross hematuria    Hepatic steatosis    Hyperlipidemia LDL goal < 70    Hypertension    Metastatic adenocarcinoma to prostate ([place]) 2017   Gleason score 9, metastasis to R external Iliac node and large extraprostatic extension at prostatectomy   Multiple sclerosis ([place]) 1991   Bilateral optic neuritis, treated and resolved but felt to be a manifestation of MS.   Renal cyst, left    Bosniak 2K (indeterminant) hyperdense cyst CT 2014, Stable CT in 2016   Ulcerative colitis [facility])     Past Surgical History:  Procedure Laterality Date   FOOT SURGERY     R toe Fx (screws),  L dorsal FX (screws)   LEFT HEART CATHETERIZATION WITH CORONARY ANGIOGRAM N/A 10/22/2012   Procedure: LEFT HEART CATHETERIZATION WITH CORONARY ANGIOGRAM;  Surgeon: Sinclair Grooms, MD;  [facility];  Service: Cardiovascular;  Laterality: N/A;   LYMPHADENECTOMY Bilateral 11/24/2015   Procedure: BILATERAL  PELVIC LYMPHADENECTOMY;  Surgeon: Alexis Frock, MD;  Location: [facility] ORS;  Service: Urology;  Laterality: Bilateral;   PERCUTANEOUS CORONARY STENT INTERVENTION (PCI-S) N/A 10/23/2012   Procedure: PERCUTANEOUS CORONARY STENT INTERVENTION (PCI-S);  Surgeon: Sinclair Grooms, MD;  [facility] C[facility] LAB;  Service: Cardiovascular;  Laterality: N/A;   PROSTATE BIOPSY  09/2015   Dr. Tresa [name]   ROBOT ASSISTED LAPAROSCOPIC RADICAL PROSTATECTOMY N/A 11/24/2015   Procedure: XI ROBOTIC ASSISTED LAPAROSCOPIC RADICAL PROSTATECTOMY WITH INDOCYANINE GREEN DYE;  Surgeon: Alexis Frock, MD;  Location: [facility] ORS;  Service: Urology;  Laterality: N/A;   VASECTOMY      Current Outpatient Medications  Medication Instructions   abiraterone acetate (ZYTIGA) 250 MG tablet Take 4 tablets by mouth once per day on an empty stomach, 1 hour before or 2 hours after a meal.   amLODipine (NORVASC) 10 MG tablet TAKE 1 TABLET BY MOUTH DAILY   aspirin 81 mg, Oral, Daily   atorvastatin (LIPITOR) 20 MG tablet TAKE 1 TABLET BY MOUTH ONCE  DAILY   carvedilol (COREG) 25 MG tablet TAKE 1 TABLET BY MOUTH TWICE  DAILY WITH MEALS   ezetimibe (ZETIA) 10 mg, Oral, Daily   Leuprolide Acetate (LUPRON IJ) Every 6 months   levothyroxine (SYNTHROID) 100 MCG tablet TAKE 1 TABLET BY MOUTH DAILY  BEFORE BREAKFAST   lisinopril (ZESTRIL) 40 MG  tablet TAKE 1 TABLET BY MOUTH DAILY   nitroGLYCERIN (NITROSTAT) 0.4 mg, Sublingual, Every 5 min x3 PRN   sertraline (ZOLOFT) 100 mg, Oral, Daily   sildenafil (REVATIO) 60-80 mg, Oral, At bedtime PRN   sodium chloride (OCEAN) 0.65 % SOLN nasal spray 1 spray, Each Nare, As needed   spironolactone (ALDACTONE) 25 MG tablet TAKE ONE-HALF TABLET BY MOUTH  DAILY       Objective:   Physical Exam BP 126/64   Pulse (!) 53   Temp (!) 97.5 F (36.4 C) (Oral)   Resp 18   Ht 6' (1.829 m)   Wt 265 lb 4 oz (120.3 kg)   SpO2 96%   BMI 35.97 kg/m  General:   Well developed, NAD, BMI noted. HEENT:  Normocephalic .  Face symmetric, atraumatic Lungs:  CTA B Normal respiratory effort, no intercostal retractions, no accessory muscle use. Heart: RRR,  no murmur.  Foot exam: No lower extremity edema, good pedal pulses bilaterally, well perfused toes. Skin: Not pale. Not jaundice Neurologic:  alert & oriented X3.  Speech normal, gait appropriate for age and unassisted Psych--  Cognition and judgment appear intact.  Cooperative with normal attention span and concentration.  Behavior appropriate. No anxious or depressed appearing.      Assessment      Assessment  Pre-diabetes HTN Hypokalemia: Saw endo 09/09/2019, w/u:no clear evidence of aldosterone producing tumor.  Spironolactone would be a good addition to her BP regimen Hyperlipidemia Hypothyroidism Anxiety CAD s/p stents 2014  EtOH dependency GI: ---Ulcerative colitis- last cscope 05-2015 ---LFTs elevated since 12/13/2020. *++  etoh  *h/o neg Hep B & C serology * Zytiga started 05-2019 *on atorvastatin for years.   *Liver US 12/ 2022 : fatty liver and a increased vascularity structure of the gallbladder needing a follow-up ultrasound in 1 year. GU: -Metastatic prostate cancer, DX 11-2015, radical prostatectomy 11-2015 --  continent as off  07-2016--on androgen depravation  -Hematuria, saw urology 01-2013 reviewed: CT show a renal cyst, cystoscopy negative, they recommended follow-up MRI for the renal cyst. NEURO: -MS  Dx 1991 (B neuritis, rx, resolved, felt to be d/t MS) -Bilateral thenar atrophy noted 06-2017 (apparently chronic) -Neuropathy: severe per  NCS 07-2019 +, likely Charcot Mary Tooth Nose bleed: R nostril artery cauterize 08-2017 -TGA: DX 09/2018, MRI brain, EEG negative saw neurology in follow-up, carotid ultrasound ordered: 1 to 39% bilaterally.  PLAN AST ALT FLP TSH HTN: BP well controlled, continue amlodipine, carvedilol, lisinopril, Aldactone.  Last BMP okay, recommend ambulatory BPs Hyperlipidemia: On Lipitor, Zetia added  based on last FLP.  Checking labs Hypothyroidism: On Synthroid, check TSH CAD: Denies chest pain or difficulty breathing.  Request a refill on NTG.  Sent. Increased LFTs: Chart reviewed: *++  etoh  *h/o neg Hep B & C serology * Zytiga started 05-2019 *on atorvastatin for years.   *Liver US 12/ 2022 : fatty liver and a increased vascularity structure of the gallbladder needing a follow-up ultrasound in 1 year. Overall suspect due to EtOH, given history of ulcerative colitis will advised to have further eval at GI.  Will send them a letter Ulcerative colitis: Reportedly reestablish with [facility].  Currently asymptomatic PVD?  Had a + screening for PVD, exam is benign today. Gait disorder: Reports multiple falls, request a parking permit, falls due to Charcot-Marie-Tooth and DJD.  Referred to PT.  Encouraged to use a cane consistently. Charcot-Marie-Tooth: Has chronic numbness at the toes, strongly encouraged good feet care.  See AVS  Preventive care: Reports that he had a flu, RSV and COVID vaccines EtOH: "I have cut down", reports he had a couple of beers 2 days ago. RTC 4 months  Letter GI   6 Here for CPX Prediabetes: Check A1c HTN: Reports ambulatory BPs are great, continue amlodipine, carvedilol, lisinopril, Aldactone.  Checking labs. Hyperlipidemia: Last LDL not at goal in the context of CAD.  Lipitor 20 mg not adjusted because at that time LFTs were elevated.  Recheck FLP and LFTs today. Increased LFTs:  Previous hep C serology negative, check hep B serology. Liver US December 2022 showed fatty liver and a increased vascularity structure of the gallbladder needing a follow-up ultrasound in 1 year. EtOH: Still drinks heavily on and off.  Abstinence or at least moderation strongly encourage Ulcerative colitis: From time to time has anorectal discomfort, he thinks related to hemorrhoids.  No diarrhea, he does have some mesalamine and he takes orally as needed.  Previous GI Dr. Cristina Gong  retired, refer to [facility] to get established. Hypothyroidism: Check TSH Anxiety: on sertraline well-controlled CAD: Asymptomatic.  Continue aspirin, statins. Prostate cancer: Last visit with urology 11/05/2021. RTC 4 months

## 2022-08-15 NOTE — Assessment & Plan Note (Signed)
HTN: BP well controlled, continue amlodipine, carvedilol, lisinopril, Aldactone.  Last BMP okay, recommend ambulatory BPs Hyperlipidemia: On Lipitor, Zetia added based on last FLP.  Checking labs Hypothyroidism: On Synthroid, check TSH CAD: Denies chest pain or difficulty breathing.  Request a refill on NTG.  Sent. Increased LFTs: Chart reviewed: *++  etoh  *h/o neg Hep B & C serology * Zytiga started 05-2019 *on atorvastatin for years.   *Liver US 12/ 2022 : fatty liver and a increased vascularity structure of the gallbladder needing a follow-up ultrasound in 1 year. Overall suspect LFTs elevation d/t  EtOH, given history of ulcerative colitis will advised to have further eval at GI.  Will send them a letter Ulcerative colitis: Reportedly reestablish with [facility].  Currently asymptomatic PVD?  Had a + screening for PVD, exam is benign today. Gait disorder: Reports multiple falls, request a parking permit, falls due to Charcot-Marie-Tooth and DJD.  Referred to PT.  Encouraged to use a cane consistently. Charcot-Marie-Tooth: Has chronic numbness at the toes, strongly encouraged good feet care.  See AVS Preventive care: Reports that he had a flu, RSV and COVID vaccines EtOH: "I have cut down", reports he had a couple of beers 2 days ago. RTC 4 months

## 2022-08-16 ENCOUNTER — Telehealth: Payer: Self-pay | Admitting: Internal Medicine

## 2022-08-16 NOTE — Telephone Encounter (Signed)
Thank you. JP

## 2022-08-16 NOTE — Telephone Encounter (Signed)
Gary [name] from [facility] called to advise that the doctor reviewed the records and said that his elevated liver enzymes could be related to alcohol. They can monitor it and if they become persistently elevated they will order MRCT. If you have questions/concerns, you can reach Gary [name] at [phone] ext [phone]

## 2022-09-10 DIAGNOSIS — R296 Repeated falls: Secondary | ICD-10-CM | POA: Diagnosis not present

## 2022-09-16 ENCOUNTER — Telehealth: Payer: Self-pay

## 2022-09-16 NOTE — Telephone Encounter (Signed)
Plan of care signed and faxed back to [facility] at [phone]. Form sent for scanning.

## 2022-10-15 DIAGNOSIS — K824 Cholesterolosis of gallbladder: Secondary | ICD-10-CM

## 2022-10-16 ENCOUNTER — Ambulatory Visit (HOSPITAL_BASED_OUTPATIENT_CLINIC_OR_DEPARTMENT_OTHER)
Admission: RE | Admit: 2022-10-16 | Discharge: 2022-10-16 | Disposition: A | Discharge status: Home / Self Care | Payer: Medicare Other | Source: Ambulatory Visit | Attending: Internal Medicine | Admitting: Internal Medicine

## 2022-10-16 DIAGNOSIS — K824 Cholesterolosis of gallbladder: Secondary | ICD-10-CM | POA: Diagnosis not present

## 2022-10-16 DIAGNOSIS — K760 Fatty (change of) liver, not elsewhere classified: Secondary | ICD-10-CM | POA: Diagnosis not present

## 2022-11-04 LAB — PSA: PSA: <0.015

## 2022-11-04 IMAGING — NM NM BONE WHOLE BODY
2 series · 2 of 2 positions shown · non-contrast
Comparison: None.

CLINICAL DATA: Prostate carcinoma

EXAM:
NUCLEAR MEDICINE WHOLE BODY BONE SCAN
TECHNIQUE: Whole body anterior and posterior images were obtained approximately
3 hours after intravenous injection of radiopharmaceutical.
RADIOPHARMACEUTICALS:  20.3 mCi Qechnetium-33m MDP IV

[Series 1: whole body · 2.66mm/px · 1 of 1 slices shown (1 of 2)]
[im 1/1]
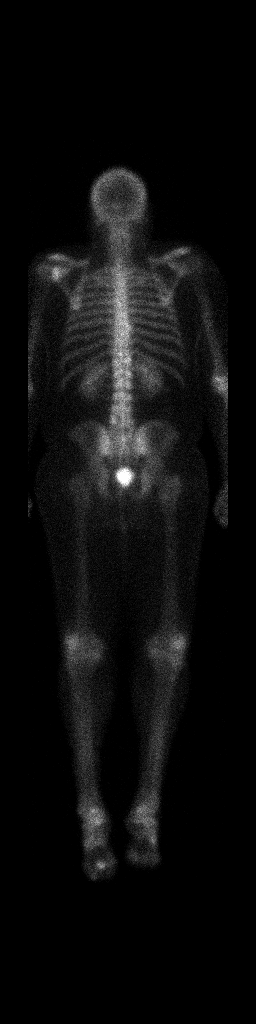

[Series 1: whole body · 2.66mm/px · 1 of 1 slices shown (2 of 2)]
[im 1/1]
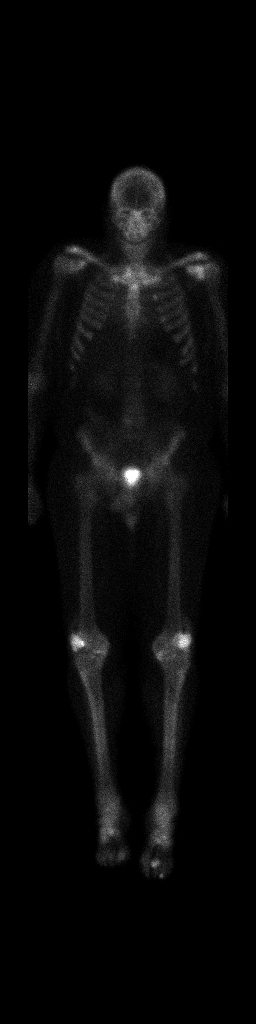

[2 of 2 positions shown; findings below may reference images not displayed]

FINDINGS: Increased uptake in ankles and knees is likely of arthropathic
etiology. There is asymmetric increased uptake in the lateral left
scapular region compared to the right side. Elsewhere, the
distribution of radiotracer uptake is unremarkable. Kidneys are
noted in the flank positions bilaterally.
IMPRESSION: Abnormal radiotracer uptake in the lateral left scapula near the
left glenoid is noted. Etiology for this localized area of abnormal
uptake is uncertain. Correlation with left scapula/left shoulder
radiographs advised to further evaluate. While possibly a metastatic
focus, this appearance would be unusual as an isolated prostate
carcinoma metastasis.

Study otherwise unremarkable except for areas of apparent
arthropathic change in major joints.

These results will be called to the ordering clinician or
representative by the Radiologist Assistant, and communication
documented in the PACS or [REDACTED].

## 2022-11-11 DIAGNOSIS — C775 Secondary and unspecified malignant neoplasm of intrapelvic lymph nodes: Secondary | ICD-10-CM | POA: Diagnosis not present

## 2022-11-12 ENCOUNTER — Encounter: Payer: Self-pay | Admitting: Internal Medicine

## 2022-11-12 ENCOUNTER — Telehealth: Payer: Self-pay

## 2022-11-12 NOTE — Telephone Encounter (Signed)
Plan of care received from [facility] Form signed and faxed back to [phone]. Form sent for scanning.

## 2022-11-15 ENCOUNTER — Telehealth: Payer: Self-pay | Admitting: Internal Medicine

## 2022-11-15 MED ORDER — AZELASTINE HCL 0.1 % NA SOLN: 2.0000 | Freq: Two times a day (BID) | NASAL | 1 refills | Status: DC | PRN

## 2022-11-15 MED ORDER — EZETIMIBE 10 MG PO TABS: 10.0000 mg | ORAL_TABLET | Freq: Every day | ORAL | 1 refills | Status: DC

## 2022-11-15 NOTE — Telephone Encounter (Signed)
Rx sent 

## 2022-11-15 NOTE — Telephone Encounter (Signed)
Prescription Request  11/15/2022  Is this a "Controlled Substance" medicine? No  LOV: 08/14/2022  What is the name of the medication or equipment?   azelastine (ASTELIN) 0.1 % nasal spray [id number]  DISCONTINUED   Have you contacted your pharmacy to request a refill? No   Which pharmacy would you like this sent to?   [place], [address] [place] [address] Phone: [phone] Fax: [phone]    Patient notified that their request is being sent to the clinical staff for review and that they should receive a response within 2 business days.   Please advise at Mobile [phone] (mobile)

## 2022-11-15 NOTE — Telephone Encounter (Signed)
Medication: ezetimibe (ZETIA) 10 MG tablet  Has the patient contacted their pharmacy? Yes.     Preferred Pharmacy:   [facility] [place], [place] - [address] AT [facility] [address], [place] [address] Phone: [phone]  Fax: [phone]

## 2022-11-15 NOTE — Telephone Encounter (Signed)
Please advise- no longer on med list.

## 2022-11-15 NOTE — Telephone Encounter (Signed)
Okay to send a prescription for Astepro 2 sprays on each side of the nose twice daily as needed, 42-monthsupply

## 2022-12-30 ENCOUNTER — Ambulatory Visit (INDEPENDENT_AMBULATORY_CARE_PROVIDER_SITE_OTHER): Payer: Medicare Other | Admitting: Internal Medicine

## 2022-12-30 ENCOUNTER — Encounter: Payer: Self-pay | Admitting: Internal Medicine

## 2022-12-30 VITALS — BP 126/68 | HR 61 | Temp 97.8°F | Resp 18 | Ht 72.0 in | Wt 271.5 lb

## 2022-12-30 DIAGNOSIS — E785 Hyperlipidemia, unspecified: Secondary | ICD-10-CM | POA: Diagnosis not present

## 2022-12-30 DIAGNOSIS — R739 Hyperglycemia, unspecified: Secondary | ICD-10-CM

## 2022-12-30 DIAGNOSIS — I10 Essential (primary) hypertension: Secondary | ICD-10-CM

## 2022-12-30 DIAGNOSIS — J302 Other seasonal allergic rhinitis: Secondary | ICD-10-CM | POA: Diagnosis not present

## 2022-12-30 DIAGNOSIS — R7989 Other specified abnormal findings of blood chemistry: Secondary | ICD-10-CM | POA: Diagnosis not present

## 2022-12-30 DIAGNOSIS — E039 Hypothyroidism, unspecified: Secondary | ICD-10-CM

## 2022-12-30 LAB — CBC WITH DIFFERENTIAL/PLATELET
Basophils Absolute: 0.1 10*3/uL (ref 0.0–0.1)
Basophils Relative: 0.8 % (ref 0.0–3.0)
Eosinophils Absolute: 0.2 10*3/uL (ref 0.0–0.7)
Eosinophils Relative: 3 % (ref 0.0–5.0)
HCT: 41 % (ref 39.0–52.0)
Hemoglobin: 14.2 g/dL (ref 13.0–17.0)
Lymphocytes Relative: 33.4 % (ref 12.0–46.0)
Lymphs Abs: 2.2 10*3/uL (ref 0.7–4.0)
MCHC: 34.7 g/dL (ref 30.0–36.0)
MCV: 85.2 fl (ref 78.0–100.0)
Monocytes Absolute: 0.4 10*3/uL (ref 0.1–1.0)
Monocytes Relative: 5.8 % (ref 3.0–12.0)
Neutro Abs: 3.7 10*3/uL (ref 1.4–7.7)
Neutrophils Relative %: 57 % (ref 43.0–77.0)
Platelets: 175 10*3/uL (ref 150.0–400.0)
RBC: 4.82 Mil/uL (ref 4.22–5.81)
RDW: 13.6 % (ref 11.5–15.5)
WBC: 6.5 10*3/uL (ref 4.0–10.5)

## 2022-12-30 LAB — BASIC METABOLIC PANEL
BUN: 14 mg/dL (ref 6–23)
CO2: 26 mEq/L (ref 19–32)
Calcium: 9.4 mg/dL (ref 8.4–10.5)
Chloride: 102 mEq/L (ref 96–112)
Creatinine, Ser: 0.75 mg/dL (ref 0.40–1.50)
GFR: 95.54 mL/min (ref >60.00)
Glucose, Bld: 108 mg/dL — ABNORMAL HIGH (ref 70–99)
Potassium: 4.4 mEq/L (ref 3.5–5.1)
Sodium: 137 mEq/L (ref 135–145)

## 2022-12-30 LAB — AST: AST: 23 U/L (ref 0–37)

## 2022-12-30 LAB — HEMOGLOBIN A1C: Hgb A1c MFr Bld: 6.2 % (ref 4.6–6.5)

## 2022-12-30 LAB — ALT: ALT: 42 U/L (ref 0–53)

## 2022-12-30 NOTE — Progress Notes (Signed)
Subjective:    Patient ID: Gary [name], male    DOB: 16-Oct-1955, 67 y.o.   MRN: [id number]  DOS:  12/30/2022 Type of visit - description: f/u  Chronic medical problems assessed. Good med compliance. Denies chest pain or difficulty breathing No ambulatory BPs.  Also, for the last few weeks has been experiencing more allergies: Sinus congestion, some sneezing.  No chest congestion, occasional cough.  No fever or chills.   Review of Systems See above   Past Medical History:  Diagnosis Date   Anxiety    CAD (coronary artery disease) 10-2012   h/o stents    Charcot-Marie disease 07/28/2019   Elevated PSA    Prostate Bx w/ Dr. Tresa [name]   EtOH dependence [facility])    Family history of adverse reaction to anesthesia    made father disoriented   Gross hematuria    Hepatic steatosis    Hyperlipidemia LDL goal < 70    Hypertension    Metastatic adenocarcinoma to prostate ([place]) 2017   Gleason score 9, metastasis to R external Iliac node and large extraprostatic extension at prostatectomy   Multiple sclerosis ([place]) 1991   Bilateral optic neuritis, treated and resolved but felt to be a manifestation of MS.   Renal cyst, left    Bosniak 2K (indeterminant) hyperdense cyst CT 2014, Stable CT in 2016   Ulcerative colitis [facility])     Past Surgical History:  Procedure Laterality Date   FOOT SURGERY     R toe Fx (screws),  L dorsal FX (screws)   LEFT HEART CATHETERIZATION WITH CORONARY ANGIOGRAM N/A 10/22/2012   Procedure: LEFT HEART CATHETERIZATION WITH CORONARY ANGIOGRAM;  Surgeon: Sinclair Grooms, MD;  Location: [facility] CATH LAB;  Service: Cardiovascular;  Laterality: N/A;   LYMPHADENECTOMY Bilateral 11/24/2015   Procedure: BILATERAL PELVIC LYMPHADENECTOMY;  Surgeon: Alexis Frock, MD;  Location: [facility];  Service: Urology;  Laterality: Bilateral;   PERCUTANEOUS CORONARY STENT INTERVENTION (PCI-S) N/A 10/23/2012   Procedure: PERCUTANEOUS CORONARY STENT INTERVENTION (PCI-S);  Surgeon: Sinclair Grooms, MD;  Location: [facility] CATH LAB;  Service: Cardiovascular;  Laterality: N/A;   PROSTATE BIOPSY  09/2015   Dr. Tresa [name]   ROBOT ASSISTED LAPAROSCOPIC RADICAL PROSTATECTOMY N/A 11/24/2015   Procedure: XI ROBOTIC ASSISTED LAPAROSCOPIC RADICAL PROSTATECTOMY WITH INDOCYANINE GREEN DYE;  Surgeon: Alexis Frock, MD;  Location: [facility];  Service: Urology;  Laterality: N/A;   VASECTOMY      Current Outpatient Medications  Medication Instructions   abiraterone acetate (ZYTIGA) 250 MG tablet Take 4 tablets by mouth once per day on an empty stomach, 1 hour before or 2 hours after a meal.   amLODipine (NORVASC) 10 MG tablet TAKE 1 TABLET BY MOUTH DAILY   aspirin 81 mg, Oral, Daily   atorvastatin (LIPITOR) 20 MG tablet TAKE 1 TABLET BY MOUTH ONCE  DAILY   azelastine (ASTELIN) 0.1 % nasal spray 2 sprays, Each Nare, 2 times daily PRN, Use in each nostril as directed   carvedilol (COREG) 25 MG tablet TAKE 1 TABLET BY MOUTH TWICE  DAILY WITH MEALS   ezetimibe (ZETIA) 10 mg, Oral, Daily   Leuprolide Acetate (LUPRON IJ) Every 6 months   levothyroxine (SYNTHROID) 100 MCG tablet TAKE 1 TABLET BY MOUTH DAILY  BEFORE BREAKFAST   lisinopril (ZESTRIL) 40 MG tablet TAKE 1 TABLET BY MOUTH DAILY   nitroGLYCERIN (NITROSTAT) 0.4 mg, Sublingual, Every 5 min x3 PRN   sertraline (ZOLOFT) 100 mg, Oral, Daily   sodium chloride (OCEAN)  0.65 % SOLN nasal spray 1 spray, Each Nare, As needed   spironolactone (ALDACTONE) 25 MG tablet TAKE ONE-HALF TABLET BY MOUTH  DAILY       Objective:   Physical Exam BP 126/68   Pulse 61   Temp 97.8 F (36.6 C) (Oral)   Resp 18   Ht 6' (1.829 m)   Wt 271 lb 8 oz (123.2 kg)   SpO2 96%   BMI 36.82 kg/m  General:   Well developed, NAD, BMI noted. HEENT:  Normocephalic . Face symmetric, atraumatic + Nose congestion, throat without white patches Lungs:  CTA B Normal respiratory effort, no intercostal retractions, no accessory muscle use. Heart: RRR,  no murmur.  Lower  extremities: no pretibial edema bilaterally  Skin: Not pale. Not jaundice Neurologic:  alert & oriented X3.  Speech normal, gait appropriate for age and unassisted Psych--  Cognition and judgment appear intact.  Cooperative with normal attention span and concentration.  Behavior appropriate. No anxious or depressed appearing.      Assessment    Assessment  Pre-diabetes HTN Hypokalemia: Saw endo 09/09/2019, w/u:no clear evidence of aldosterone producing tumor.  Spironolactone would be a good addition to her BP regimen Hyperlipidemia Hypothyroidism Anxiety CAD s/p stents 2014  EtOH dependency GI: ---Ulcerative colitis- last cscope 05-2015 ---LFTs elevated since 12/13/2020. *++  etoh  *h/o neg Hep B & C serology * Zytiga started 05-2019 *on atorvastatin for years.   *Liver US 12/ 2022 : fatty liver and a increased vascularity structure of the gallbladder, next [place] 10-2023 GU: -Metastatic prostate cancer, DX 11-2015, radical prostatectomy 11-2015 --  continent as off  07-2016--on androgen depravation  -Hematuria, saw urology 01-2013 reviewed: CT show a renal cyst, cystoscopy negative, they recommended follow-up MRI for the renal cyst. 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 Nose bleed: R nostril artery cauterize 08-2017   PLAN Prediabetes: Check A1c HTN: Seems well, does not do ambulatory BPs.  Continue amlodipine, carvedilol, lisinopril, Aldactone.  Check labs CAD: Reports no chest pain. EtOH: Reports she has cut down significantly, no daily drinking. Increased LFTs: see LOV, we communicate w/ GI, see phone note >>  they believe this is alcohol related, continue monitoring, if persistently elevated may need  MRCP. Next liver US: 10-2023 Hypothyroidism: Check TSH, continue Synthroid, adjust meds if needed. Allergies: On Astelin, recommend to take Allegra and Flonase until allergy season is over.  See AVS. Social: Lost his brother, emotionally doing "okay". Falls: History of falls, he thinks related to neuropathy, uses a cane, did PT before, did not help much, declines further PT RTC 4 months CPX

## 2022-12-30 NOTE — Assessment & Plan Note (Signed)
Prediabetes: Check A1c HTN: Seems well, does not do ambulatory BPs.  Continue amlodipine, carvedilol, lisinopril, Aldactone.  Check labs CAD: Reports no chest pain. EtOH: Reports she has cut down significantly, no daily drinking. Increased LFTs: see LOV, we communicate w/ GI, see phone note >>  they believe this is alcohol related, continue monitoring, if persistently elevated may need MRCP. Next liver US: 10-2023 Hypothyroidism: Check TSH, continue Synthroid, adjust meds if needed. Allergies: On Astelin, recommend to take Allegra and Flonase until allergy season is over.  See AVS. Social: Lost his brother, emotionally doing "okay". Falls: History of falls, he thinks related to neuropathy, uses a cane, did PT before, did not help much, declines further PT RTC 4 months CPX

## 2022-12-30 NOTE — Patient Instructions (Addendum)
For allergies: Continue Astelin twice daily Add over-the-counter Flonase: 1 spray right side of the nose daily Start Allegra 60 mg over-the-counter: 1 tablet twice a day as needed. Once allergy season is over you can discontinue the medications.  Vaccines I recommend:  Tdap (tetanus)     GO TO THE LAB : Get the blood work     GO TO THE FRONT DESK, PLEASE SCHEDULE YOUR APPOINTMENTS Come back for a physical exam in 4 months

## 2022-12-31 LAB — TSH: TSH: 1.49 u[IU]/mL (ref 0.35–5.50)

## 2023-01-09 ENCOUNTER — Ambulatory Visit: Payer: Medicare Other | Admitting: Internal Medicine

## 2023-01-16 ENCOUNTER — Other Ambulatory Visit: Payer: Self-pay | Admitting: Internal Medicine

## 2023-01-23 ENCOUNTER — Other Ambulatory Visit: Payer: Self-pay | Admitting: Internal Medicine

## 2023-02-26 ENCOUNTER — Other Ambulatory Visit: Payer: Self-pay | Admitting: Internal Medicine

## 2023-03-12 ENCOUNTER — Encounter: Payer: Self-pay | Admitting: Family Medicine

## 2023-03-12 ENCOUNTER — Ambulatory Visit (INDEPENDENT_AMBULATORY_CARE_PROVIDER_SITE_OTHER): Payer: Medicare Other | Admitting: Family Medicine

## 2023-03-12 VITALS — BP 120/72 | HR 59 | Temp 97.8°F | Ht 72.0 in | Wt 266.0 lb

## 2023-03-12 DIAGNOSIS — J069 Acute upper respiratory infection, unspecified: Secondary | ICD-10-CM

## 2023-03-12 MED ORDER — PROMETHAZINE-DM 6.25-15 MG/5ML PO SYRP
5.0000 mL | ORAL_SOLUTION | Freq: Four times a day (QID) | ORAL | 0 refills | Status: DC | PRN
Start: 2023-03-12 — End: 2024-07-13

## 2023-03-12 NOTE — Patient Instructions (Addendum)
Continue to push fluids, practice good hand hygiene, and cover your mouth if you cough.  If you start having fevers, shaking or shortness of breath, seek immediate care.  OK to take Tylenol 1000 mg (2 extra strength tabs) or 975 mg (3 regular strength tabs) every 6 hours as needed.  Do not drink alcohol, do any illicit/street drugs, drive or do anything that requires alertness while on this cough medicine.  Let us know if you need anything.  

## 2023-03-12 NOTE — Progress Notes (Signed)
Chief Complaint  Patient presents with   Nasal Congestion    Cough     Lyndal Rainbow here for URI complaints.  Duration: 6 days  Associated symptoms: sinus congestion, rhinorrhea, wheezing, and coughing Denies: sinus pain, itchy watery eyes, ear pain, ear drainage, sore throat, shortness of breath, myalgia, and fevers, N/V/D Treatment to date: Astelin nasal spray, Benadryl, Allegra, Zyrtec, Coricidin Sick contacts: No  Past Medical History:  Diagnosis Date   Anxiety    CAD (coronary artery disease) 10-2012   h/o stents    Charcot-Marie disease 07/28/2019   Elevated PSA    Prostate Bx w/ Dr. Berneice Heinrich   EtOH dependence [facility])    Family history of adverse reaction to anesthesia    made father disoriented   Gross hematuria    Hepatic steatosis    Hyperlipidemia LDL goal < 70    Hypertension    Metastatic adenocarcinoma to prostate (HCC) 2017   Gleason score 9, metastasis to R external Iliac node and large extraprostatic extension at prostatectomy   Multiple sclerosis (HCC) 1991   Bilateral optic neuritis, treated and resolved but felt to be a manifestation of MS.   Renal cyst, left    Bosniak 2K (indeterminant) hyperdense cyst CT 2014, Stable CT in 2016   Ulcerative colitis (HCC)     Objective BP 120/72 (BP Location: Left Arm, Patient Position: Sitting, Cuff Size: Large)   Pulse (!) 59   Temp 97.8 F (36.6 C) (Oral)   Ht 6' (1.829 m)   Wt 266 lb (120.7 kg)   SpO2 93%   BMI 36.08 kg/m  General: Awake, alert, appears stated age HEENT: AT, [place], ears patent b/l and TM's neg, nares patent w/o discharge, pharynx pink and without exudates, MMM Neck: No masses or asymmetry Heart: RRR Lungs: CTAB, no accessory muscle use Psych: Age appropriate judgment and insight, normal mood and affect  Viral URI with cough - Plan: promethazine-dextromethorphan (PROMETHAZINE-DM) 6.25-15 MG/5ML syrup  Reports he is steadily getting better.  Will send in a cough syrup to help with his cough.   Warned about drowsiness associated with this medication.  He will let me know if he fails to improve.  Continue to push fluids, practice good hand hygiene, cover mouth when coughing. F/u prn. If starting to experience fevers, shaking, or shortness of breath, seek immediate care. Pt voiced understanding and agreement to the plan.  Jilda Roche [place], DO 03/12/23 3:12 PM

## 2023-04-29 ENCOUNTER — Encounter: Payer: Medicare Other | Admitting: Internal Medicine

## 2023-05-05 DIAGNOSIS — K5120 Ulcerative (chronic) proctitis without complications: Secondary | ICD-10-CM | POA: Diagnosis not present

## 2023-05-13 ENCOUNTER — Ambulatory Visit (INDEPENDENT_AMBULATORY_CARE_PROVIDER_SITE_OTHER): Payer: Medicare Other | Admitting: Internal Medicine

## 2023-05-13 ENCOUNTER — Encounter: Payer: Self-pay | Admitting: Internal Medicine

## 2023-05-13 VITALS — BP 120/62 | HR 52 | Temp 97.9°F | Resp 18 | Ht 72.0 in | Wt 265.8 lb

## 2023-05-13 DIAGNOSIS — R739 Hyperglycemia, unspecified: Secondary | ICD-10-CM | POA: Diagnosis not present

## 2023-05-13 DIAGNOSIS — Z0000 Encounter for general adult medical examination without abnormal findings: Secondary | ICD-10-CM

## 2023-05-13 DIAGNOSIS — E785 Hyperlipidemia, unspecified: Secondary | ICD-10-CM | POA: Diagnosis not present

## 2023-05-13 DIAGNOSIS — E039 Hypothyroidism, unspecified: Secondary | ICD-10-CM | POA: Diagnosis not present

## 2023-05-13 LAB — PSA: PSA: <0.015

## 2023-05-13 NOTE — Patient Instructions (Signed)
Vaccines I recommend: COVID and flu shot this fall  GO TO THE LAB : Get the blood work     GO TO THE FRONT DESK, PLEASE SCHEDULE YOUR APPOINTMENTS Come back for a checkup in 6 months

## 2023-05-13 NOTE — Progress Notes (Unsigned)
Subjective:    Patient ID: Gary [name], male    DOB: 1956-01-07, 67 y.o.   MRN: [id number]  DOS:  05/13/2023 Type of visit - description: CPX  Here for CPX, has no major concerns.  Things are stable.  Review of Systems See above   Past Medical History:  Diagnosis Date   Anxiety    CAD (coronary artery disease) 10-2012   h/o stents    Charcot-Marie disease 07/28/2019   Elevated PSA    Prostate Bx w/ Dr. Berneice Heinrich   EtOH dependence [facility])    Family history of adverse reaction to anesthesia    made father disoriented   Gross hematuria    Hepatic steatosis    Hyperlipidemia LDL goal < 70    Hypertension    Metastatic adenocarcinoma to prostate (HCC) 2017   Gleason score 9, metastasis to R external Iliac node and large extraprostatic extension at prostatectomy   Multiple sclerosis (HCC) 1991   Bilateral optic neuritis, treated and resolved but felt to be a manifestation of MS.   Renal cyst, left    Bosniak 2K (indeterminant) hyperdense cyst CT 2014, Stable CT in 2016   Ulcerative colitis [facility])     Past Surgical History:  Procedure Laterality Date   FOOT SURGERY     R toe Fx (screws),  L dorsal FX (screws)   LEFT HEART CATHETERIZATION WITH CORONARY ANGIOGRAM N/A 10/22/2012   Procedure: LEFT HEART CATHETERIZATION WITH CORONARY ANGIOGRAM;  Surgeon: Lesleigh Noe, MD;  Location: [facility] CATH LAB;  Service: Cardiovascular;  Laterality: N/A;   LYMPHADENECTOMY Bilateral 11/24/2015   Procedure: BILATERAL PELVIC LYMPHADENECTOMY;  Surgeon: Sebastian Ache, MD;  Location: [facility] ORS;  Service: Urology;  Laterality: Bilateral;   PERCUTANEOUS CORONARY STENT INTERVENTION (PCI-S) N/A 10/23/2012   Procedure: PERCUTANEOUS CORONARY STENT INTERVENTION (PCI-S);  Surgeon: Lesleigh Noe, MD;  Location: [facility] CATH LAB;  Service: Cardiovascular;  Laterality: N/A;   PROSTATE BIOPSY  09/2015   Dr. Berneice Heinrich   ROBOT ASSISTED LAPAROSCOPIC RADICAL PROSTATECTOMY N/A 11/24/2015   Procedure: XI ROBOTIC ASSISTED  LAPAROSCOPIC RADICAL PROSTATECTOMY WITH INDOCYANINE GREEN DYE;  Surgeon: Sebastian Ache, MD;  Location: [facility] ORS;  Service: Urology;  Laterality: N/A;   VASECTOMY      Current Outpatient Medications  Medication Instructions   amLODipine (NORVASC) 10 mg, Oral, Daily   aspirin 81 mg, Oral, Daily   atorvastatin (LIPITOR) 20 mg, Oral, Daily   azelastine (ASTELIN) 0.1 % nasal spray 2 sprays, Each Nare, 2 times daily PRN, Use in each nostril as directed   carvedilol (COREG) 25 mg, Oral, 2 times daily with meals   ezetimibe (ZETIA) 10 mg, Oral, Daily   Leuprolide Acetate (LUPRON IJ) Every 6 months   levothyroxine (SYNTHROID) 100 mcg, Oral, Daily before breakfast   lisinopril (ZESTRIL) 40 mg, Oral, Daily   promethazine-dextromethorphan (PROMETHAZINE-DM) 6.25-15 MG/5ML syrup 5 mLs, Oral, 4 times daily PRN   sertraline (ZOLOFT) 100 mg, Oral, Daily   sodium chloride (OCEAN) 0.65 % SOLN nasal spray 1 spray, Each Nare, As needed   spironolactone (ALDACTONE) 12.5 mg, Oral, Daily       Objective:   Physical Exam BP 120/62 (BP Location: Left Arm, Patient Position: Sitting, Cuff Size: Large)   Pulse (!) 52   Temp 97.9 F (36.6 C) (Oral)   Resp 18   Ht 6' (1.829 m)   Wt 265 lb 12.8 oz (120.6 kg)   SpO2 97%   BMI 36.05 kg/m  General: Well developed, NAD,  BMI noted Neck: No  thyromegaly  HEENT:  Normocephalic . Face symmetric, atraumatic Lungs:  CTA B Normal respiratory effort, no intercostal retractions, no accessory muscle use. Heart: RRR,  no murmur.  Abdomen:  Not distended, soft, non-tender. No rebound or rigidity.   Lower extremities: no pretibial edema bilaterally  Skin: Exposed areas without rash. Not pale. Not jaundice Neurologic:  alert & oriented X3.  Speech normal, gait is limited, uses a cane.  Transfer by himself  psych: Cognition and judgment appear intact.  Cooperative with normal attention span and concentration.  Behavior appropriate. No anxious or depressed  appearing.     Assessment    Assessment  Pre-diabetes HTN Hypokalemia: Saw endo 09/09/2019, w/u:no clear evidence of aldosterone producing tumor.  Spironolactone would be a good addition to her BP regimen Hyperlipidemia Hypothyroidism Anxiety CAD s/p stents 2014  EtOH dependency GI: ---Ulcerative colitis- last cscope 05-2015 ---LFTs elevated since 12/13/2020. *++  etoh  *h/o neg Hep B & C serology * Zytiga started 05-2019 *on atorvastatin for years.   *Liver US 12/ 2022 : fatty liver and a increased vascularity structure of the gallbladder, next [place] 10-2023 GU: -Metastatic prostate cancer, DX 11-2015, radical prostatectomy 11-2015 --  continent as off  07-2016--on androgen depravation  -Hematuria, saw urology 01-2013 reviewed: CT show a renal cyst, cystoscopy negative, they recommended follow-up MRI for the renal cyst. 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 Nose bleed: R nostril artery cauterize 08-2017   PLAN Here for CPX -Td 2015 - pnm 23-2015 and 2022;  prevnar--2017   -S/p Shingrix; s/p RSV - Recommend: covid booster and a flu shot  -CCS S/p colonoscopy (~ 2011?) showed adenomatous polyps Cscope 05-2015: mild erythema, no polyps Cscope 06-2019, Dr Matthias Hughs  Flex sig: 08-2020 Saw GI, they rec next Cscope 2027 per pt  -H/o Prostate cancer, f/u elsewhere Labs:   FLP A1c TSH POA: on file  Chronic medical problems: Good compliance with multiple medications, labs reviewed, will get FLP A1c and TSH. RTC 6 months     Prediabetes: Check A1c HTN: Seems well, does not do ambulatory BPs.  Continue amlodipine, carvedilol, lisinopril, Aldactone.  Check labs CAD: Reports no chest pain. EtOH: Reports she has cut  down significantly, no daily drinking. Increased LFTs: see LOV, we communicate w/ GI, see phone note >>  they believe this is alcohol related, continue monitoring, if persistently elevated may need MRCP. Next liver US: 10-2023 Hypothyroidism: Check TSH, continue Synthroid, adjust meds if needed. Allergies: On Astelin, recommend to take Allegra and Flonase until allergy season is over.  See AVS. Social: Lost his brother, emotionally doing "okay". Falls: History of falls, he thinks related to neuropathy, uses a cane, did PT before, did not help much, declines further PT RTC 4 months CPX

## 2023-05-14 ENCOUNTER — Encounter: Payer: Self-pay | Admitting: Internal Medicine

## 2023-05-14 NOTE — Assessment & Plan Note (Signed)
Here for CPX -Td 2015 - pnm 23-2015 and 2022;  prevnar--2017   -S/p Shingrix; s/p RSV - Recommend: covid booster and a flu shot  -CCS S/p colonoscopy (~ 2011?) showed adenomatous polyps Cscope 05-2015: mild erythema, no polyps Cscope 06-2019, Dr Matthias Hughs  Flex sig: 08-2020 Saw GI, they rec next Cscope 2027 per pt  -H/o Prostate cancer, f/u elsewhere Labs:   FLP A1c TSH POA: on file

## 2023-05-14 NOTE — Assessment & Plan Note (Signed)
Here for CPX Chronic medical problems: Good compliance with multiple medications, labs reviewed, will get FLP A1c and TSH. RTC 6 months

## 2023-05-16 NOTE — Addendum Note (Signed)
Addended by: Wilford Corner on: 05/16/2023 05:01 PM   Modules accepted: Orders

## 2023-06-12 ENCOUNTER — Telehealth: Payer: Self-pay | Admitting: Internal Medicine

## 2023-06-12 NOTE — Telephone Encounter (Signed)
Spoke w/ [facility]- verbal given to change manufacturer.

## 2023-06-12 NOTE — Telephone Encounter (Signed)
Judeth Cornfield from [facility] called & requested to receive a approval from Dr. Drue Novel to change the manufacture for the levothyroxine (SYNTHROID) 100 MCG tablet. Please call and advise at [phone]. Ref. [id number].

## 2023-06-18 DIAGNOSIS — C775 Secondary and unspecified malignant neoplasm of intrapelvic lymph nodes: Secondary | ICD-10-CM | POA: Diagnosis not present

## 2023-06-20 ENCOUNTER — Encounter: Payer: Self-pay | Admitting: Internal Medicine

## 2023-06-26 ENCOUNTER — Other Ambulatory Visit: Payer: Self-pay

## 2023-06-26 MED ORDER — EZETIMIBE 10 MG PO TABS: 10.0000 mg | ORAL_TABLET | Freq: Every day | ORAL | 1 refills | Status: DC

## 2023-06-30 ENCOUNTER — Other Ambulatory Visit (INDEPENDENT_AMBULATORY_CARE_PROVIDER_SITE_OTHER): Payer: Medicare Other

## 2023-06-30 DIAGNOSIS — E785 Hyperlipidemia, unspecified: Secondary | ICD-10-CM

## 2023-06-30 LAB — LIPID PANEL
Cholesterol: 123 mg/dL (ref 0–200)
HDL: 36.4 mg/dL — ABNORMAL LOW (ref >39.00)
LDL Cholesterol: 53 mg/dL (ref 0–99)
NonHDL: 86.37
Total CHOL/HDL Ratio: 3
Triglycerides: 168 mg/dL — ABNORMAL HIGH (ref 0.0–149.0)
VLDL: 33.6 mg/dL (ref 0.0–40.0)

## 2023-06-30 LAB — AST: AST: 19 U/L (ref 0–37)

## 2023-06-30 LAB — ALT: ALT: 31 U/L (ref 0–53)

## 2023-07-01 MED ORDER — ATORVASTATIN CALCIUM 40 MG PO TABS: 40.0000 mg | ORAL_TABLET | Freq: Every day | ORAL | 1 refills | Status: DC

## 2023-07-01 NOTE — Addendum Note (Signed)
Addended byConrad Jerauld D on: 07/01/2023 07:49 AM   Modules accepted: Orders

## 2023-08-05 ENCOUNTER — Other Ambulatory Visit: Payer: Self-pay | Admitting: Internal Medicine

## 2023-08-12 ENCOUNTER — Other Ambulatory Visit: Payer: Self-pay | Admitting: Internal Medicine

## 2023-09-03 ENCOUNTER — Other Ambulatory Visit: Payer: Self-pay | Admitting: Internal Medicine

## 2023-09-08 ENCOUNTER — Other Ambulatory Visit: Payer: Self-pay | Admitting: Internal Medicine

## 2023-11-04 ENCOUNTER — Encounter: Payer: Self-pay | Admitting: Internal Medicine

## 2023-11-14 ENCOUNTER — Ambulatory Visit (INDEPENDENT_AMBULATORY_CARE_PROVIDER_SITE_OTHER): Payer: Medicare Other | Admitting: Internal Medicine

## 2023-11-14 ENCOUNTER — Encounter: Payer: Self-pay | Admitting: Internal Medicine

## 2023-11-14 VITALS — BP 116/70 | HR 55 | Temp 97.9°F | Resp 16 | Ht 72.0 in | Wt 261.4 lb

## 2023-11-14 DIAGNOSIS — E785 Hyperlipidemia, unspecified: Secondary | ICD-10-CM

## 2023-11-14 DIAGNOSIS — R739 Hyperglycemia, unspecified: Secondary | ICD-10-CM | POA: Diagnosis not present

## 2023-11-14 DIAGNOSIS — L03031 Cellulitis of right toe: Secondary | ICD-10-CM | POA: Diagnosis not present

## 2023-11-14 DIAGNOSIS — E039 Hypothyroidism, unspecified: Secondary | ICD-10-CM

## 2023-11-14 DIAGNOSIS — T84223A Displacement of internal fixation device of bones of foot and toes, initial encounter: Secondary | ICD-10-CM

## 2023-11-14 DIAGNOSIS — L089 Local infection of the skin and subcutaneous tissue, unspecified: Secondary | ICD-10-CM | POA: Diagnosis not present

## 2023-11-14 DIAGNOSIS — I10 Essential (primary) hypertension: Secondary | ICD-10-CM

## 2023-11-14 DIAGNOSIS — Z23 Encounter for immunization: Secondary | ICD-10-CM | POA: Diagnosis not present

## 2023-11-14 LAB — CBC WITH DIFFERENTIAL/PLATELET
Basophils Absolute: 0.1 10*3/uL (ref 0.0–0.1)
Basophils Relative: 0.9 % (ref 0.0–3.0)
Eosinophils Absolute: 0.3 10*3/uL (ref 0.0–0.7)
Eosinophils Relative: 3.9 % (ref 0.0–5.0)
HCT: 42.4 % (ref 39.0–52.0)
Hemoglobin: 14.2 g/dL (ref 13.0–17.0)
Lymphocytes Relative: 32.7 % (ref 12.0–46.0)
Lymphs Abs: 2.2 10*3/uL (ref 0.7–4.0)
MCHC: 33.5 g/dL (ref 30.0–36.0)
MCV: 85.4 fL (ref 78.0–100.0)
Monocytes Absolute: 0.4 10*3/uL (ref 0.1–1.0)
Monocytes Relative: 6 % (ref 3.0–12.0)
Neutro Abs: 3.8 10*3/uL (ref 1.4–7.7)
Neutrophils Relative %: 56.5 % (ref 43.0–77.0)
Platelets: 181 10*3/uL (ref 150.0–400.0)
RBC: 4.97 Mil/uL (ref 4.22–5.81)
RDW: 14.1 % (ref 11.5–15.5)
WBC: 6.7 10*3/uL (ref 4.0–10.5)

## 2023-11-14 LAB — BASIC METABOLIC PANEL
BUN: 14 mg/dL (ref 6–23)
CO2: 24 meq/L (ref 19–32)
Calcium: 9.4 mg/dL (ref 8.4–10.5)
Chloride: 102 meq/L (ref 96–112)
Creatinine, Ser: 0.7 mg/dL (ref 0.40–1.50)
GFR: 96.96 mL/min (ref >60.00)
Glucose, Bld: 106 mg/dL — ABNORMAL HIGH (ref 70–99)
Potassium: 4.5 meq/L (ref 3.5–5.1)
Sodium: 139 meq/L (ref 135–145)

## 2023-11-14 LAB — HEMOGLOBIN A1C: Hgb A1c MFr Bld: 6.3 % (ref 4.6–6.5)

## 2023-11-14 LAB — TSH: TSH: 3.51 u[IU]/mL (ref 0.35–5.50)

## 2023-11-14 NOTE — Patient Instructions (Addendum)
 We are referring you to the orthopedic doctor regards your right toe. If he can't  see you in a timely manner let me know. Call anytime if you have fever chills, redness or swelling there.  Check the  blood pressure regularly Blood pressure goal:  between 110/65 and  135/85. If it is consistently higher or lower, let me know     GO TO THE LAB : Get the blood work     Next visit with me in 4 months Please schedule it at the front desk

## 2023-11-14 NOTE — Progress Notes (Addendum)
 Subjective:    Patient ID: Gary [name], male    DOB: 1955-12-24, 68 y.o.   MRN: [id number]  DOS:  11/14/2023 Type of visit - description: f/u  Chronic medical problems addressed. Also, around 8 weeks ago noted the opening at the right great toe, has been on and off discharge from there. Denies fever or chills.  Toe is not swollen, warm or tender per se.  Also denies chest pain or difficulty breathing No nausea vomiting.  No diarrhea.   Review of Systems See above   Past Medical History:  Diagnosis Date   Anxiety    CAD (coronary artery disease) 10-2012   h/o stents    Charcot-Marie disease 07/28/2019   Elevated PSA    Prostate Bx w/ Dr. Secundino Dach   EtOH dependence [facility])    Family history of adverse reaction to anesthesia    made father disoriented   Gross hematuria    Hepatic steatosis    Hyperlipidemia LDL goal < 70    Hypertension    Metastatic adenocarcinoma to prostate (HCC) 2017   Gleason score 9, metastasis to R external Iliac node and large extraprostatic extension at prostatectomy   Multiple sclerosis (HCC) 1991   Bilateral optic neuritis, treated and resolved but felt to be a manifestation of MS.   Renal cyst, left    Bosniak 2K (indeterminant) hyperdense cyst CT 2014, Stable CT in 2016   Ulcerative colitis [facility])     Past Surgical History:  Procedure Laterality Date   FOOT SURGERY     R toe Fx (screws),  L dorsal FX (screws)   LEFT HEART CATHETERIZATION WITH CORONARY ANGIOGRAM N/A 10/22/2012   Procedure: LEFT HEART CATHETERIZATION WITH CORONARY ANGIOGRAM;  Surgeon: Mickiel Albany, MD;  Location: [facility] CATH LAB;  Service: Cardiovascular;  Laterality: N/A;   LYMPHADENECTOMY Bilateral 11/24/2015   Procedure: BILATERAL PELVIC LYMPHADENECTOMY;  Surgeon: Osborn Blaze, MD;  Location: [facility] ORS;  Service: Urology;  Laterality: Bilateral;   PERCUTANEOUS CORONARY STENT INTERVENTION (PCI-S) N/A 10/23/2012   Procedure: PERCUTANEOUS CORONARY STENT INTERVENTION (PCI-S);   Surgeon: Mickiel Albany, MD;  Location: [facility] CATH LAB;  Service: Cardiovascular;  Laterality: N/A;   PROSTATE BIOPSY  09/2015   Dr. Secundino Dach   ROBOT ASSISTED LAPAROSCOPIC RADICAL PROSTATECTOMY N/A 11/24/2015   Procedure: XI ROBOTIC ASSISTED LAPAROSCOPIC RADICAL PROSTATECTOMY WITH INDOCYANINE GREEN DYE;  Surgeon: Osborn Blaze, MD;  Location: [facility] ORS;  Service: Urology;  Laterality: N/A;   VASECTOMY      Current Outpatient Medications  Medication Instructions   amLODipine  (NORVASC ) 10 mg, Oral, Daily   aspirin  81 mg, Daily   atorvastatin  (LIPITOR) 40 mg, Oral, Daily at bedtime   azelastine  (ASTELIN ) 0.1 % nasal spray 2 sprays, Each Nare, 2 times daily PRN, Use in each nostril as directed   carvedilol  (COREG ) 25 mg, Oral, 2 times daily with meals   ezetimibe  (ZETIA ) 10 mg, Oral, Daily   Leuprolide  Acetate (LUPRON  IJ) Every 6 months   levothyroxine  (SYNTHROID ) 100 mcg, Oral, Daily before breakfast   lisinopril  (ZESTRIL ) 40 mg, Oral, Daily   promethazine -dextromethorphan (PROMETHAZINE -DM) 6.25-15 MG/5ML syrup 5 mLs, Oral, 4 times daily PRN   sertraline  (ZOLOFT ) 100 mg, Oral, Daily   sodium chloride  (OCEAN) 0.65 % SOLN nasal spray 1 spray, As needed   spironolactone  (ALDACTONE ) 12.5 mg, Oral, Daily       Objective:   Physical Exam BP 116/70   Pulse (!) 55   Temp 97.9 F (36.6 C) (Oral)  Resp 16   Ht 6' (1.829 m)   Wt 261 lb 6 oz (118.6 kg)   SpO2 94%   BMI 35.45 kg/m  General:   Well developed, NAD, BMI noted. HEENT:  Normocephalic . Face symmetric, atraumatic Lower extremity: Right great toe: At the tip of the toe he has a 3 mm opening, draining a very small amount of blood pus.  The toe itself is not swollen, warm or TTP. Left foot with some calluses at the base of the great toe. Skin: Not pale. Not jaundice Neurologic:  alert & oriented X3.  Speech normal, gait appropriate for age and unassisted Psych--  Cognition and judgment appear intact.  Cooperative with normal  attention span and concentration.  Behavior appropriate. No anxious or depressed appearing.      Assessment     Assessment  Pre-diabetes HTN Hypokalemia: Saw endo 09/09/2019, w/u:no clear evidence of aldosterone producing tumor.  Spironolactone  would be a good addition to her BP regimen Hyperlipidemia Hypothyroidism Anxiety CAD s/p stents 2014  EtOH dependency GI: ---Ulcerative colitis- last cscope 05-2015 ---LFTs elevated since 12/13/2020. *++  etoh  *h/o neg Hep B & C serology * Zytiga  started 05-2019 *on atorvastatin  for years.   *Liver US  12/ 2022 : fatty liver and a increased vascularity structure of the gallbladder, next US  10-2023 GU: -Metastatic prostate cancer, DX 11-2015, radical prostatectomy 11-2015 --  continent as off  07-2016--on androgen depravation  -Hematuria, saw urology 01-2013 reviewed: CT show a renal cyst, cystoscopy negative, they recommended follow-up MRI for the renal cyst. 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 Nose bleed: R nostril artery cauterize 08-2017   PLAN Prediabetes: Check A1c HTN, no ambulatory BPs.  Continue carvedilol , lisinopril , Aldactone .  Check BMP and CBC. CAD: Reports no angina. High cholesterol: Atorvastatin  increased to 40 mg August 2024, follow-up LDL improved at 53 Hypothyroidism: On Synthroid , check TSH Anxiety: Doing well, on sertraline , had his first grandson, very happy about it Chronic infection, R great toe: The patient had a fracture there in the 90s, had a screw there ; 8 weeks ago noted a opening with some discharge at the tip of the toe.  We talk about  x-rays, antibiotics, a referral  He prefers the specialist to handle the workup.  Referral placed,  advised to call me if he cannot be seen soon. EtOH: Still drinks sporadically but continue to do better than in the past. Preventive care: Td today. RTC 4 months

## 2023-11-15 NOTE — Assessment & Plan Note (Signed)
 Prediabetes: Check A1c HTN, no ambulatory BPs.  Continue carvedilol , lisinopril , Aldactone .  Check BMP and CBC. CAD: Reports no angina. High cholesterol: Atorvastatin  increased to 40 mg August 2024, follow-up LDL improved at 53 Hypothyroidism: On Synthroid , check TSH Anxiety: Doing well, on sertraline , had his first grandson, very happy about it Chronic infection, R great toe: The patient had a fracture there in the 90s, had a screw there ; 8 weeks ago noted a opening with some discharge at the tip of the toe.  We talk about  x-rays, antibiotics, a referral  He prefers the specialist to handle the workup.  Referral placed, advised to call me if he cannot be seen soon. EtOH: Still drinks sporadically but continue to do better than in the past. Preventive care: Td today. RTC 4 months

## 2023-11-16 ENCOUNTER — Encounter: Payer: Self-pay | Admitting: Internal Medicine

## 2023-11-18 ENCOUNTER — Encounter: Payer: Self-pay | Admitting: Internal Medicine

## 2023-11-24 DIAGNOSIS — M79674 Pain in right toe(s): Secondary | ICD-10-CM | POA: Diagnosis not present

## 2023-11-24 DIAGNOSIS — M79672 Pain in left foot: Secondary | ICD-10-CM | POA: Diagnosis not present

## 2023-11-26 ENCOUNTER — Other Ambulatory Visit: Payer: Self-pay | Admitting: Internal Medicine

## 2023-12-10 ENCOUNTER — Ambulatory Visit (INDEPENDENT_AMBULATORY_CARE_PROVIDER_SITE_OTHER): Payer: Self-pay | Admitting: *Deleted

## 2023-12-10 DIAGNOSIS — Z0000 Encounter for general adult medical examination without abnormal findings: Secondary | ICD-10-CM

## 2023-12-10 NOTE — Patient Instructions (Signed)
 [name] , Thank you for taking time to come for your Medicare Wellness Visit. I appreciate your ongoing commitment to your health goals. Please review the following plan we discussed and let me know if I can assist you in the future.   These are the goals we discussed:  Goals   None     This is a list of the screening recommended for you and due dates:  Health Maintenance  Topic Date Due   COVID-19 Vaccine (8 - 2024-25 season) 05/03/2024*   Medicare Annual Wellness Visit  12/09/2024   Colon Cancer Screening  07/05/2026   Pneumonia Vaccine (4 of 4 - PPSV23 or PCV20) 09/04/2026   DTaP/Tdap/Td vaccine (2 - Tdap) 11/13/2033   Flu Shot  Completed   Hepatitis C Screening  Completed   HIV Screening  Completed   Zoster (Shingles) Vaccine  Completed   HPV Vaccine  Aged Out  *Topic was postponed. The date shown is not the original due date.     Next appointment: Follow up in one year for your annual wellness visit.   Preventive Care 51 Years and Older, Male Preventive care refers to lifestyle choices and visits with your health care provider that can promote health and wellness. What does preventive care include? A yearly physical exam. This is also called an annual well check. Dental exams once or twice a year. Routine eye exams. Ask your health care provider how often you should have your eyes checked. Personal lifestyle choices, including: Daily care of your teeth and gums. Regular physical activity. Eating a healthy diet. Avoiding tobacco and drug use. Limiting alcohol use. Practicing safe sex. Taking low doses of aspirin every day. Taking vitamin and mineral supplements as recommended by your health care provider. What happens during an annual well check? The services and screenings done by your health care provider during your annual well check will depend on your age, overall health, lifestyle risk factors, and family history of disease. Counseling  Your health care  provider may ask you questions about your: Alcohol use. Tobacco use. Drug use. Emotional well-being. Home and relationship well-being. Sexual activity. Eating habits. History of falls. Memory and ability to understand (cognition). Work and work Astronomer. Screening  You may have the following tests or measurements: Height, weight, and BMI. Blood pressure. Lipid and cholesterol levels. These may be checked every 5 years, or more frequently if you are over 62 years old. Skin check. Lung cancer screening. You may have this screening every year starting at age 61 if you have a 30-pack-year history of smoking and currently smoke or have quit within the past 15 years. Fecal occult blood test (FOBT) of the stool. You may have this test every year starting at age 42. Flexible sigmoidoscopy or colonoscopy. You may have a sigmoidoscopy every 5 years or a colonoscopy every 10 years starting at age 52. Prostate cancer screening. Recommendations will vary depending on your family history and other risks. Hepatitis C blood test. Hepatitis B blood test. Sexually transmitted disease (STD) testing. Diabetes screening. This is done by checking your blood sugar (glucose) after you have not eaten for a while (fasting). You may have this done every 1-3 years. Abdominal aortic aneurysm (AAA) screening. You may need this if you are a current or former smoker. Osteoporosis. You may be screened starting at age 30 if you are at high risk. Talk with your health care provider about your test results, treatment options, and if necessary, the need for more  tests. Vaccines  Your health care provider may recommend certain vaccines, such as: Influenza vaccine. This is recommended every year. Tetanus, diphtheria, and acellular pertussis (Tdap, Td) vaccine. You may need a Td booster every 10 years. Zoster vaccine. You may need this after age 67. Pneumococcal 13-valent conjugate (PCV13) vaccine. One dose is  recommended after age 68. Pneumococcal polysaccharide (PPSV23) vaccine. One dose is recommended after age 41. Talk to your health care provider about which screenings and vaccines you need and how often you need them. This information is not intended to replace advice given to you by your health care provider. Make sure you discuss any questions you have with your health care provider. Document Released: 10/20/2015 Document Revised: 06/12/2016 Document Reviewed: 07/25/2015 Elsevier Interactive Patient Education  2017 [employer].  Fall Prevention in the Home Falls can cause injuries. They can happen to people of all ages. There are many things you can do to make your home safe and to help prevent falls. What can I do on the outside of my home? Regularly fix the edges of walkways and driveways and fix any cracks. Remove anything that might make you trip as you walk through a door, such as a raised step or threshold. Trim any bushes or trees on the path to your home. Use bright outdoor lighting. Clear any walking paths of anything that might make someone trip, such as rocks or tools. Regularly check to see if handrails are loose or broken. Make sure that both sides of any steps have handrails. Any raised decks and porches should have guardrails on the edges. Have any leaves, snow, or ice cleared regularly. Use sand or salt on walking paths during winter. Clean up any spills in your garage right away. This includes oil or grease spills. What can I do in the bathroom? Use night lights. Install grab bars by the toilet and in the tub and shower. Do not use towel bars as grab bars. Use non-skid mats or decals in the tub or shower. If you need to sit down in the shower, use a plastic, non-slip stool. Keep the floor dry. Clean up any water that spills on the floor as soon as it happens. Remove soap buildup in the tub or shower regularly. Attach bath mats securely with double-sided non-slip rug  tape. Do not have throw rugs and other things on the floor that can make you trip. What can I do in the bedroom? Use night lights. Make sure that you have a light by your bed that is easy to reach. Do not use any sheets or blankets that are too big for your bed. They should not hang down onto the floor. Have a firm chair that has side arms. You can use this for support while you get dressed. Do not have throw rugs and other things on the floor that can make you trip. What can I do in the kitchen? Clean up any spills right away. Avoid walking on wet floors. Keep items that you use a lot in easy-to-reach places. If you need to reach something above you, use a strong step stool that has a grab bar. Keep electrical cords out of the way. Do not use floor polish or wax that makes floors slippery. If you must use wax, use non-skid floor wax. Do not have throw rugs and other things on the floor that can make you trip. What can I do with my stairs? Do not leave any items on the stairs. Make sure  that there are handrails on both sides of the stairs and use them. Fix handrails that are broken or loose. Make sure that handrails are as long as the stairways. Check any carpeting to make sure that it is firmly attached to the stairs. Fix any carpet that is loose or worn. Avoid having throw rugs at the top or bottom of the stairs. If you do have throw rugs, attach them to the floor with carpet tape. Make sure that you have a light switch at the top of the stairs and the bottom of the stairs. If you do not have them, ask someone to add them for you. What else can I do to help prevent falls? Wear shoes that: Do not have high heels. Have rubber bottoms. Are comfortable and fit you well. Are closed at the toe. Do not wear sandals. If you use a stepladder: Make sure that it is fully opened. Do not climb a closed stepladder. Make sure that both sides of the stepladder are locked into place. Ask someone to  hold it for you, if possible. Clearly mark and make sure that you can see: Any grab bars or handrails. First and last steps. Where the edge of each step is. Use tools that help you move around (mobility aids) if they are needed. These include: Canes. Walkers. Scooters. Crutches. Turn on the lights when you go into a dark area. Replace any light bulbs as soon as they burn out. Set up your furniture so you have a clear path. Avoid moving your furniture around. If any of your floors are uneven, fix them. If there are any pets around you, be aware of where they are. Review your medicines with your doctor. Some medicines can make you feel dizzy. This can increase your chance of falling. Ask your doctor what other things that you can do to help prevent falls. This information is not intended to replace advice given to you by your health care provider. Make sure you discuss any questions you have with your health care provider. Document Released: 07/20/2009 Document Revised: 02/29/2016 Document Reviewed: 10/28/2014 Elsevier Interactive Patient Education  2017 [employer].

## 2023-12-10 NOTE — Progress Notes (Signed)
 Subjective:   Gary [name] is a 68 y.o. male who presents for Medicare Annual/Subsequent preventive examination.  Visit Complete: Virtual I connected with  Lyndal Rainbow on 12/10/23 by a audio enabled telemedicine application and verified that I am speaking with the correct person using two identifiers.  Patient Location: Home  Provider Location: Office/Clinic  I discussed the limitations of evaluation and management by telemedicine. The patient expressed understanding and agreed to proceed.  Vital Signs: Because this visit was a virtual/telehealth visit, some criteria may be missing or patient reported. Any vitals not documented were not able to be obtained and vitals that have been documented are patient reported.  Patient Medicare AWV questionnaire was completed by the patient on 12/09/23; I have confirmed that all information answered by patient is correct and no changes since this date.  Cardiac Risk Factors include: advanced age (>55men, >53 women);male gender;dyslipidemia;hypertension     Objective:    There were no vitals filed for this visit. There is no height or weight on file to calculate BMI.     12/10/2023    1:43 PM 03/19/2022    3:51 PM 12/14/2019   10:20 AM 09/18/2018    1:21 PM 09/15/2018    5:43 PM 08/16/2017   10:26 PM 08/01/2017    1:20 PM  Advanced Directives  Does Patient Have a Medical Advance Directive? Yes Yes No  No No No  Type of Advance Directive Living will;Healthcare Power of [employer] Power of [place];Out of facility DNR (pink MOST or yellow form);Living will       Does patient want to make changes to medical advance directive? No - Patient declined        Copy of Healthcare Power of Attorney in Chart? Yes - validated most recent copy scanned in chart (See row information) No - copy requested       Would patient like information on creating a medical advance directive?   No - Patient declined  No - Patient declined No - Patient  declined      Information is confidential and restricted. Go to Review Flowsheets to unlock data.    Current Medications (verified) Outpatient Encounter Medications as of 12/10/2023  Medication Sig   amLODipine (NORVASC) 10 MG tablet Take 1 tablet (10 mg total) by mouth daily.   aspirin 81 MG tablet Take 81 mg by mouth daily.   atorvastatin (LIPITOR) 40 MG tablet Take 1 tablet (40 mg total) by mouth at bedtime.   azelastine (ASTELIN) 0.1 % nasal spray Place 2 sprays into both nostrils 2 (two) times daily as needed for rhinitis or allergies. Use in each nostril as directed   carvedilol (COREG) 25 MG tablet Take 1 tablet (25 mg total) by mouth 2 (two) times daily with a meal.   ezetimibe (ZETIA) 10 MG tablet Take 1 tablet (10 mg total) by mouth daily.   Leuprolide Acetate (LUPRON IJ) every 6 (six) months.   levothyroxine (SYNTHROID) 100 MCG tablet Take 1 tablet (100 mcg total) by mouth daily before breakfast.   lisinopril (ZESTRIL) 40 MG tablet Take 1 tablet (40 mg total) by mouth daily.   promethazine-dextromethorphan (PROMETHAZINE-DM) 6.25-15 MG/5ML syrup Take 5 mLs by mouth 4 (four) times daily as needed for cough. (Patient not taking: Reported on 11/14/2023)   sertraline (ZOLOFT) 100 MG tablet Take 1 tablet (100 mg total) by mouth daily.   sodium chloride (OCEAN) 0.65 % SOLN nasal spray Place 1 spray into both nostrils as needed for  congestion.   spironolactone (ALDACTONE) 25 MG tablet Take 0.5 tablets (12.5 mg total) by mouth daily.   No facility-administered encounter medications on file as of 12/10/2023.    Allergies (verified) Patient has no known allergies.   History: Past Medical History:  Diagnosis Date   Anxiety    CAD (coronary artery disease) 10-2012   h/o stents    Charcot-Marie disease 07/28/2019   Elevated PSA    Prostate Bx w/ Dr. Berneice Heinrich   EtOH dependence [facility])    Family history of adverse reaction to anesthesia    made father disoriented   Gross hematuria    Hepatic  steatosis    Hyperlipidemia LDL goal < 70    Hypertension    Metastatic adenocarcinoma to prostate (HCC) 2017   Gleason score 9, metastasis to R external Iliac node and large extraprostatic extension at prostatectomy   Multiple sclerosis (HCC) 1991   Bilateral optic neuritis, treated and resolved but felt to be a manifestation of MS.   Renal cyst, left    Bosniak 2K (indeterminant) hyperdense cyst CT 2014, Stable CT in 2016   Ulcerative colitis [facility])    Past Surgical History:  Procedure Laterality Date   FOOT SURGERY     R toe Fx (screws),  L dorsal FX (screws)   LEFT HEART CATHETERIZATION WITH CORONARY ANGIOGRAM N/A 10/22/2012   Procedure: LEFT HEART CATHETERIZATION WITH CORONARY ANGIOGRAM;  Surgeon: Lesleigh Noe, MD;  Location: [facility] CATH LAB;  Service: Cardiovascular;  Laterality: N/A;   LYMPHADENECTOMY Bilateral 11/24/2015   Procedure: BILATERAL PELVIC LYMPHADENECTOMY;  Surgeon: Sebastian Ache, MD;  Location: [facility] ORS;  Service: Urology;  Laterality: Bilateral;   PERCUTANEOUS CORONARY STENT INTERVENTION (PCI-S) N/A 10/23/2012   Procedure: PERCUTANEOUS CORONARY STENT INTERVENTION (PCI-S);  Surgeon: Lesleigh Noe, MD;  Location: [facility] CATH LAB;  Service: Cardiovascular;  Laterality: N/A;   PROSTATE BIOPSY  09/2015   Dr. Berneice Heinrich   ROBOT ASSISTED LAPAROSCOPIC RADICAL PROSTATECTOMY N/A 11/24/2015   Procedure: XI ROBOTIC ASSISTED LAPAROSCOPIC RADICAL PROSTATECTOMY WITH INDOCYANINE GREEN DYE;  Surgeon: Sebastian Ache, MD;  Location: [facility] ORS;  Service: Urology;  Laterality: N/A;   VASECTOMY     Family History  Problem Relation Age of Onset   Dementia Mother    Hypertension Mother    Coronary artery disease Father    Diabetes Mellitus II Father    Lupus Sister    Thyroid cancer Sister    Heart failure Sister    Coronary artery disease Brother    Diabetes Brother    Hypertension Brother    Prostate cancer Brother    Colon cancer Neg Hx    Social History   Socioeconomic History    Marital status: Widowed    Spouse name: Not on file   Number of children: 1   Years of education: Not on file   Highest education level: Bachelor's degree (e.g., BA, AB, BS)  Occupational History   Occupation: disable   Tobacco Use   Smoking status: Former    Current packs/day: 0.00    Types: Cigarettes    Quit date: 07/18/1990    Years since quitting: 33.4   Smokeless tobacco: Former   Tobacco comments:    1991  Substance and Sexual Activity   Alcohol use: Yes    Comment: no qd but occ heavy   Drug use: Yes    Types: Marijuana   Sexual activity: Not on file  Other Topics Concern   Not on file  Social  History Narrative   Lives by himself    Has an adult son   Social Drivers of Corporate investment banker Strain: Low Risk  (11/07/2023)   Overall Financial Resource Strain (CARDIA)    Difficulty of Paying Living Expenses: Not hard at all  Food Insecurity: No Food Insecurity (11/07/2023)   Hunger Vital Sign    Worried About Running Out of Food in the Last Year: Never true    Ran Out of Food in the Last Year: Never true  Transportation Needs: No Transportation Needs (12/10/2023)   PRAPARE - Administrator, Civil Service (Medical): No    Lack of Transportation (Non-Medical): No  Physical Activity: Sufficiently Active (11/07/2023)   Exercise Vital Sign    Days of Exercise per Week: 5 days    Minutes of Exercise per Session: 30 min  Stress: No Stress Concern Present (11/07/2023)   [employer] of Occupational Health - Occupational Stress Questionnaire    Feeling of Stress : Not at all  Social Connections: Unknown (11/07/2023)   Social Connection and Isolation Panel [NHANES]    Frequency of Communication with Friends and Family: More than three times a week    Frequency of Social Gatherings with Friends and Family: Three times a week    Attends Religious Services: Patient declined    Active Member of Clubs or Organizations: No    Attends Banker  Meetings: Not on file    Marital Status: Widowed    Tobacco Counseling Counseling given: Not Answered Tobacco comments: 1991   Clinical Intake:  Pre-visit preparation completed: Yes  Pain : No/denies pain  Nutritional Risks: None Diabetes: No  How often do you need to have someone help you when you read instructions, pamphlets, or other written materials from your doctor or pharmacy?: 1 - Never  Interpreter Needed?: No  Information entered by :: Donne Anon, CMA   Activities of Daily Living    12/09/2023    4:21 PM  In your present state of health, do you have any difficulty performing the following activities:  Hearing? 0  Vision? 0  Difficulty concentrating or making decisions? 0  Walking or climbing stairs? 1  Dressing or bathing? 0  Doing errands, shopping? 0  Preparing Food and eating ? N  Using the Toilet? N  In the past six months, have you accidently leaked urine? Y  Do you have problems with loss of bowel control? Y  Managing your Medications? N  Managing your Finances? N  Housekeeping or managing your Housekeeping? N    Patient Care Team: Wanda Plump, MD as PCP - General (Internal Medicine) Lyn Records, MD (Inactive) as Consulting Physician (Cardiology) Bernette Redbird, MD as Consulting Physician (Gastroenterology) Berneice Heinrich Delbert Phenix., MD as Consulting Physician (Urology) Benjiman Core, MD (Inactive) as Consulting Physician (Oncology) Pati Gallo, MD as Consulting Physician (Sports Medicine) Suzanna Obey, MD as Consulting Physician (Otolaryngology)  Indicate any recent Medical Services you may have received from other than [facility] providers in the past year (date may be approximate).     Assessment:   This is a routine wellness examination for Ed[place].  Hearing/Vision screen No results found.   Goals Addressed   None    Depression Screen    12/10/2023    2:32 PM 11/14/2023   10:47 AM 05/13/2023    2:37 PM 12/30/2022   11:26 AM 08/14/2022    10:46 AM 03/19/2022    3:52 PM 03/07/2022  9:54 AM  PHQ 2/9 Scores  PHQ - 2 Score 0 0 0 0 1 0 2  PHQ- 9 Score  0  1 2  7     Fall Risk    12/09/2023    4:21 PM 11/14/2023   10:47 AM 05/13/2023    2:37 PM 12/30/2022   11:01 AM 08/14/2022   10:22 AM  Fall Risk   Falls in the past year? 1 0 0 1 1  Number falls in past yr: 1 0 0 1 1  Injury with Fall? 1 0 0 0 0  Risk for fall due to : History of fall(s);Impaired balance/gait  Orthopedic patient    Follow up Falls evaluation completed Falls evaluation completed;Education provided Falls evaluation completed Falls evaluation completed;Education provided Falls evaluation completed    MEDICARE RISK AT HOME: Medicare Risk at Home Any stairs in or around the home?: (Patient-Rptd) Yes If so, are there any without handrails?: (Patient-Rptd) No Home free of loose throw rugs in walkways, pet beds, electrical cords, etc?: (Patient-Rptd) Yes Adequate lighting in your home to reduce risk of falls?: (Patient-Rptd) Yes Life alert?: (Patient-Rptd) No Use of a cane, walker or w/c?: (Patient-Rptd) Yes Grab bars in the bathroom?: (Patient-Rptd) Yes Shower chair or bench in shower?: (Patient-Rptd) Yes Elevated toilet seat or a handicapped toilet?: (Patient-Rptd) Yes  TIMED UP AND GO:  Was the test performed?  No    Cognitive Function:          12/10/2023    2:35 PM 03/19/2022    4:02 PM  6CIT Screen  What Year? 0 points 0 points  What month? 0 points 0 points  What time? 0 points 0 points  Count back from 20 0 points 0 points  Months in reverse 0 points 0 points  Repeat phrase 0 points 0 points  Total Score 0 points 0 points    Immunizations Immunization History  Administered Date(s) Administered   IPV 04/14/1990   Influenza Inj Mdck Quad With Preservative 07/19/2018   Influenza Split 06/14/2015, 07/08/2023   Influenza,inj,Quad PF,6+ Mos 07/20/2014, 07/04/2015, 06/24/2017, 07/16/2019, 08/07/2021   Influenza,trivalent, recombinat, inj, PF  07/14/2012, 07/21/2013   Influenza-Unspecified 07/22/2016, 07/07/2020, 06/29/2022   Moderna Covid-19 Vaccine Bivalent Booster 70yrs & up 06/29/2022   PFIZER Comirnaty(Gray Top)Covid-19 Tri-Sucrose Vaccine 03/01/2021   PFIZER(Purple Top)SARS-COV-2 Vaccination 12/23/2019, 01/18/2020, 08/24/2020   Pfizer Covid-19 Vaccine Bivalent Booster 45yrs & up 09/11/2021   Pfizer(Comirnaty)Fall Seasonal Vaccine 12 years and older 07/08/2023   Pneumococcal Conjugate-13 01/24/2016   Pneumococcal Polysaccharide-23 07/08/2014, 09/04/2021   Respiratory Syncytial Virus Vaccine,Recomb Aduvanted(Arexvy) 06/29/2022   Td 11/14/2023   Tetanus 07/08/2014   Zoster Recombinant(Shingrix) 07/19/2018, 10/14/2018    TDAP status: Up to date  Flu Vaccine status: Up to date  Pneumococcal vaccine status: Up to date  Covid-19 vaccine status: Information provided on how to obtain vaccines.   Qualifies for Shingles Vaccine? Yes   Zostavax completed No   Shingrix Completed?: Yes  Screening Tests Health Maintenance  Topic Date Due   Medicare Annual Wellness (AWV)  03/20/2023   COVID-19 Vaccine (8 - 2024-25 season) 05/03/2024 (Originally 09/02/2023)   Colonoscopy  07/05/2026   Pneumonia Vaccine 38+ Years old (4 of 4 - PPSV23 or PCV20) 09/04/2026   DTaP/Tdap/Td (2 - Tdap) 11/13/2033   INFLUENZA VACCINE  Completed   Hepatitis C Screening  Completed   HIV Screening  Completed   Zoster Vaccines- Shingrix  Completed   HPV VACCINES  Aged Out    Health Maintenance  Health Maintenance Due  Topic Date Due   Medicare Annual Wellness (AWV)  03/20/2023    Colorectal cancer screening: Type of screening: Colonoscopy. Completed 07/06/19. Repeat every 7 years  Lung Cancer Screening: (Low Dose CT Chest recommended if Age 42-80 years, 20 pack-year currently smoking OR have quit w/in 15years.) does not qualify.   Additional Screening:  Hepatitis C Screening: does qualify; Completed 07/12/15  Vision Screening: Recommended  annual ophthalmology exams for early detection of glaucoma and other disorders of the eye. Is the patient up to date with their annual eye exam?  Yes  Who is the provider or what is the name of the office in which the patient attends annual eye exams? [facility] [place] If pt is not established with a provider, would they like to be referred to a provider to establish care? No .   Dental Screening: Recommended annual dental exams for proper oral hygiene  Diabetic Foot Exam: N/a  Community Resource Referral / Chronic Care Management: CRR required this visit?  No   CCM required this visit?  No     Plan:     I have personally reviewed and noted the following in the patient's chart:   Medical and social history Use of alcohol, tobacco or illicit drugs  Current medications and supplements including opioid prescriptions. Patient is not currently taking opioid prescriptions. Functional ability and status Nutritional status Physical activity Advanced directives List of other physicians Hospitalizations, surgeries, and ER visits in previous 12 months Vitals Screenings to include cognitive, depression, and falls Referrals and appointments  In addition, I have reviewed and discussed with patient certain preventive protocols, quality metrics, and best practice recommendations. A written personalized care plan for preventive services as well as general preventive health recommendations were provided to patient.     Donne Anon, CMA   12/10/2023   After Visit Summary: (MyChart) Due to this being a telephonic visit, the after visit summary with patients personalized plan was offered to patient via MyChart   Nurse Notes: None

## 2024-01-26 LAB — PSA: PSA: <0.015

## 2024-02-01 ENCOUNTER — Other Ambulatory Visit: Payer: Self-pay | Admitting: Internal Medicine

## 2024-02-02 DIAGNOSIS — C775 Secondary and unspecified malignant neoplasm of intrapelvic lymph nodes: Secondary | ICD-10-CM | POA: Diagnosis not present

## 2024-02-03 ENCOUNTER — Encounter: Payer: Self-pay | Admitting: Internal Medicine

## 2024-02-08 ENCOUNTER — Other Ambulatory Visit: Payer: Self-pay | Admitting: Internal Medicine

## 2024-02-11 DIAGNOSIS — M79672 Pain in left foot: Secondary | ICD-10-CM | POA: Diagnosis not present

## 2024-02-11 DIAGNOSIS — T8484XA Pain due to internal orthopedic prosthetic devices, implants and grafts, initial encounter: Secondary | ICD-10-CM | POA: Diagnosis not present

## 2024-02-16 DIAGNOSIS — T8484XA Pain due to internal orthopedic prosthetic devices, implants and grafts, initial encounter: Secondary | ICD-10-CM | POA: Diagnosis not present

## 2024-02-16 DIAGNOSIS — M79671 Pain in right foot: Secondary | ICD-10-CM | POA: Diagnosis not present

## 2024-02-16 DIAGNOSIS — M170 Bilateral primary osteoarthritis of knee: Secondary | ICD-10-CM | POA: Diagnosis not present

## 2024-02-16 DIAGNOSIS — M79672 Pain in left foot: Secondary | ICD-10-CM | POA: Diagnosis not present

## 2024-02-16 DIAGNOSIS — M1711 Unilateral primary osteoarthritis, right knee: Secondary | ICD-10-CM | POA: Diagnosis not present

## 2024-02-16 DIAGNOSIS — M1712 Unilateral primary osteoarthritis, left knee: Secondary | ICD-10-CM | POA: Diagnosis not present

## 2024-03-15 ENCOUNTER — Encounter: Payer: Self-pay | Admitting: Internal Medicine

## 2024-03-15 ENCOUNTER — Ambulatory Visit (INDEPENDENT_AMBULATORY_CARE_PROVIDER_SITE_OTHER): Payer: Medicare Other | Admitting: Internal Medicine

## 2024-03-15 VITALS — BP 122/66 | HR 48 | Temp 97.7°F | Resp 16 | Ht 72.0 in | Wt 264.1 lb

## 2024-03-15 DIAGNOSIS — M79671 Pain in right foot: Secondary | ICD-10-CM | POA: Diagnosis not present

## 2024-03-15 DIAGNOSIS — I10 Essential (primary) hypertension: Secondary | ICD-10-CM | POA: Diagnosis not present

## 2024-03-15 DIAGNOSIS — E039 Hypothyroidism, unspecified: Secondary | ICD-10-CM

## 2024-03-15 DIAGNOSIS — E785 Hyperlipidemia, unspecified: Secondary | ICD-10-CM | POA: Diagnosis not present

## 2024-03-15 DIAGNOSIS — K5190 Ulcerative colitis, unspecified, without complications: Secondary | ICD-10-CM

## 2024-03-15 DIAGNOSIS — M79672 Pain in left foot: Secondary | ICD-10-CM | POA: Diagnosis not present

## 2024-03-15 DIAGNOSIS — M1712 Unilateral primary osteoarthritis, left knee: Secondary | ICD-10-CM | POA: Diagnosis not present

## 2024-03-15 DIAGNOSIS — M1711 Unilateral primary osteoarthritis, right knee: Secondary | ICD-10-CM | POA: Diagnosis not present

## 2024-03-15 DIAGNOSIS — M170 Bilateral primary osteoarthritis of knee: Secondary | ICD-10-CM | POA: Diagnosis not present

## 2024-03-15 LAB — COMPREHENSIVE METABOLIC PANEL WITH GFR
ALT: 36 U/L (ref 0–53)
AST: 21 U/L (ref 0–37)
Albumin: 4.5 g/dL (ref 3.5–5.2)
Alkaline Phosphatase: 88 U/L (ref 39–117)
BUN: 10 mg/dL (ref 6–23)
CO2: 25 meq/L (ref 19–32)
Calcium: 9.3 mg/dL (ref 8.4–10.5)
Chloride: 102 meq/L (ref 96–112)
Creatinine, Ser: 0.61 mg/dL (ref 0.40–1.50)
GFR: 100.83 mL/min (ref >60.00)
Glucose, Bld: 108 mg/dL — ABNORMAL HIGH (ref 70–99)
Potassium: 4.3 meq/L (ref 3.5–5.1)
Sodium: 136 meq/L (ref 135–145)
Total Bilirubin: 0.7 mg/dL (ref 0.2–1.2)
Total Protein: 7.8 g/dL (ref 6.0–8.3)

## 2024-03-15 NOTE — Patient Instructions (Signed)
   GO TO THE LAB :  Get the blood work   Your results will be posted on MyChart with my comments  Next office visit for a physical exam in 4 months Please make an appointment before you leave today

## 2024-03-15 NOTE — Progress Notes (Unsigned)
 Subjective:    Patient ID: Gary [name], male    DOB: 11/09/1955, 68 y.o.   MRN: [id number]  DOS:  03/15/2024 Type of visit - description: Routine visit  Chronic problems addressed.  History of CAD, denies chest pain or difficulty breathing.  No palpitation. History of prostate cancer, sees urology, denies dysuria, gross hematuria or incontinence.  Review of Systems See above   Past Medical History:  Diagnosis Date   Anxiety    CAD (coronary artery disease) 10-2012   h/o stents    Charcot-Marie disease 07/28/2019   Elevated PSA    Prostate Bx w/ Dr. Secundino Dach   EtOH dependence [facility])    Family history of adverse reaction to anesthesia    made father disoriented   Gross hematuria    Hepatic steatosis    Hyperlipidemia LDL goal < 70    Hypertension    Metastatic adenocarcinoma to prostate (HCC) 2017   Gleason score 9, metastasis to R external Iliac node and large extraprostatic extension at prostatectomy   Multiple sclerosis (HCC) 1991   Bilateral optic neuritis, treated and resolved but felt to be a manifestation of MS.   Renal cyst, left    Bosniak 2K (indeterminant) hyperdense cyst CT 2014, Stable CT in 2016   Ulcerative colitis [facility])     Past Surgical History:  Procedure Laterality Date   FOOT SURGERY     R toe Fx (screws),  L dorsal FX (screws)   LEFT HEART CATHETERIZATION WITH CORONARY ANGIOGRAM N/A 10/22/2012   Procedure: LEFT HEART CATHETERIZATION WITH CORONARY ANGIOGRAM;  Surgeon: Mickiel Albany, MD;  Location: [facility] CATH LAB;  Service: Cardiovascular;  Laterality: N/A;   LYMPHADENECTOMY Bilateral 11/24/2015   Procedure: BILATERAL PELVIC LYMPHADENECTOMY;  Surgeon: Osborn Blaze, MD;  Location: [facility] ORS;  Service: Urology;  Laterality: Bilateral;   PERCUTANEOUS CORONARY STENT INTERVENTION (PCI-S) N/A 10/23/2012   Procedure: PERCUTANEOUS CORONARY STENT INTERVENTION (PCI-S);  Surgeon: Mickiel Albany, MD;  Location: [facility] CATH LAB;  Service: Cardiovascular;  Laterality:  N/A;   PROSTATE BIOPSY  09/2015   Dr. Secundino Dach   ROBOT ASSISTED LAPAROSCOPIC RADICAL PROSTATECTOMY N/A 11/24/2015   Procedure: XI ROBOTIC ASSISTED LAPAROSCOPIC RADICAL PROSTATECTOMY WITH INDOCYANINE GREEN DYE;  Surgeon: Osborn Blaze, MD;  Location: [facility] ORS;  Service: Urology;  Laterality: N/A;   VASECTOMY      Current Outpatient Medications  Medication Instructions   amLODipine  (NORVASC ) 10 mg, Oral, Daily   aspirin  81 mg, Daily   atorvastatin  (LIPITOR) 40 mg, Oral, Daily at bedtime   azelastine  (ASTELIN ) 0.1 % nasal spray 2 sprays, Each Nare, 2 times daily PRN, Use in each nostril as directed   carvedilol  (COREG ) 25 mg, Oral, 2 times daily with meals   ezetimibe  (ZETIA ) 10 mg, Oral, Daily   Leuprolide  Acetate (LUPRON  IJ) Every 6 months   levothyroxine  (SYNTHROID ) 100 mcg, Oral, Daily before breakfast   lisinopril  (ZESTRIL ) 40 mg, Oral, Daily   promethazine -dextromethorphan (PROMETHAZINE -DM) 6.25-15 MG/5ML syrup 5 mLs, Oral, 4 times daily PRN   sertraline  (ZOLOFT ) 100 mg, Oral, Daily   sodium chloride  (OCEAN) 0.65 % SOLN nasal spray 1 spray, As needed   spironolactone  (ALDACTONE ) 12.5 mg, Oral, Daily       Objective:   Physical Exam BP 122/66   Pulse (!) 48   Temp 97.7 F (36.5 C) (Oral)   Resp 16   Ht 6' (1.829 m)   Wt 264 lb 2 oz (119.8 kg)   SpO2 98%   BMI  35.82 kg/m  General:   Well developed, NAD, BMI noted. HEENT:  Normocephalic . Face symmetric, atraumatic Lungs:  CTA B Normal respiratory effort, no intercostal retractions, no accessory muscle use. Heart: RRR,  no murmur.  Lower extremities: no pretibial edema bilaterally  Skin: Not pale. Not jaundice Neurologic:  alert & oriented X3.  Speech normal, gait appropriate for age and unassisted Psych--  Cognition and judgment appear intact.  Cooperative with normal attention span and concentration.  Behavior appropriate. No anxious or depressed appearing.      Assessment   Assessment   Pre-diabetes HTN Hypokalemia: Saw endo 09/09/2019, w/u:no clear evidence of aldosterone producing tumor.  Spironolactone  would be a good addition to her BP regimen Hyperlipidemia Hypothyroidism Anxiety CAD s/p stents 2014  EtOH dependency GI: ---Ulcerative colitis  ---LFTs elevated since 12/13/2020. *++  etoh  *h/o neg Hep B & C serology * Zytiga  started 05-2019 *on atorvastatin  for years.   *Liver US  12/ 2022 : fatty liver and a increased vascularity structure of the gallbladder, next US  10-2023 GU: -Metastatic prostate cancer, DX 11-2015, radical prostatectomy 11-2015 --  continent as off  07-2016--on androgen depravation  -Hematuria, saw urology 01-2013 reviewed: CT show a renal cyst, cystoscopy negative, they recommended follow-up MRI for the renal cyst. 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 Nose bleed: R nostril artery cauterize 08-2017   PLAN DM-  last A1c satisfactory HTN: No ambulatory BPs, BP today 122/66, continue amlodipine , carvedilol , lisinopril , Aldactone .  Check CMP. Increased LFTs: Checking labs Hypothyroidism: Check TSH CAD: No symptoms, on aspirin , Zetia , atorvastatin . Prostate cancer: Per urology.  See them every 6 months Ulcerative colitis: Per [facility], currently with no symptoms Chronic infection, R great toe: Saw Ortho, screw removed.  Patient reports area looks good. MSK: Having other issues including knee pain, will discussed with Ortho Social: Still needs to go to [place]  frequently to help with his grandson. RTC 4 months CPX

## 2024-03-16 NOTE — Assessment & Plan Note (Signed)
 DM-  last A1c satisfactory HTN: No ambulatory BPs, BP today 122/66, continue amlodipine , carvedilol , lisinopril , Aldactone .  Check CMP. Increased LFTs: Checking labs Hypothyroidism: Check TSH CAD: No symptoms, on aspirin , Zetia , atorvastatin . Prostate cancer: Per urology.  See them every 6 months Ulcerative colitis: Per [facility], currently with no symptoms Chronic infection, R great toe: Saw Ortho, screw removed.  Patient reports area looks good. MSK: Having other issues including knee pain, will discussed with Ortho Social: Still needs to go to [place]  frequently to help with his grandson. RTC 4 months CPX

## 2024-03-18 LAB — TSH: TSH: 3.83 u[IU]/mL (ref 0.35–5.50)

## 2024-03-19 ENCOUNTER — Ambulatory Visit: Payer: Self-pay | Admitting: Internal Medicine

## 2024-04-21 ENCOUNTER — Other Ambulatory Visit: Payer: Self-pay | Admitting: Internal Medicine

## 2024-05-31 ENCOUNTER — Encounter (HOSPITAL_COMMUNITY): Payer: Self-pay | Admitting: Urology

## 2024-06-01 ENCOUNTER — Other Ambulatory Visit (HOSPITAL_COMMUNITY): Payer: Self-pay | Admitting: Urology

## 2024-06-01 DIAGNOSIS — C775 Secondary and unspecified malignant neoplasm of intrapelvic lymph nodes: Secondary | ICD-10-CM

## 2024-06-01 DIAGNOSIS — C61 Malignant neoplasm of prostate: Secondary | ICD-10-CM

## 2024-06-09 DIAGNOSIS — L97509 Non-pressure chronic ulcer of other part of unspecified foot with unspecified severity: Secondary | ICD-10-CM | POA: Diagnosis not present

## 2024-06-16 DIAGNOSIS — M79672 Pain in left foot: Secondary | ICD-10-CM | POA: Diagnosis not present

## 2024-06-30 DIAGNOSIS — M25872 Other specified joint disorders, left ankle and foot: Secondary | ICD-10-CM | POA: Diagnosis not present

## 2024-07-07 DIAGNOSIS — M79672 Pain in left foot: Secondary | ICD-10-CM | POA: Diagnosis not present

## 2024-07-09 DIAGNOSIS — D3131 Benign neoplasm of right choroid: Secondary | ICD-10-CM | POA: Diagnosis not present

## 2024-07-12 ENCOUNTER — Telehealth: Payer: Self-pay

## 2024-07-12 DIAGNOSIS — F321 Major depressive disorder, single episode, moderate: Secondary | ICD-10-CM | POA: Insufficient documentation

## 2024-07-12 NOTE — Telephone Encounter (Signed)
 Received surgery clearance form from [facility]. Pt is scheduled for L foot medial sesamoidectomy, deep tendon transfer, debridement of ulcer w/ Dr Elsa on 07/15/2024. Pt has an appt tomorrow 07/13/24.

## 2024-07-13 ENCOUNTER — Encounter: Payer: Self-pay | Admitting: Internal Medicine

## 2024-07-13 ENCOUNTER — Other Ambulatory Visit: Payer: Self-pay | Admitting: Orthopaedic Surgery

## 2024-07-13 ENCOUNTER — Ambulatory Visit: Admitting: Internal Medicine

## 2024-07-13 ENCOUNTER — Telehealth: Payer: Self-pay

## 2024-07-13 VITALS — BP 120/66 | HR 56 | Temp 97.6°F | Resp 16 | Ht 72.0 in | Wt 267.0 lb

## 2024-07-13 DIAGNOSIS — Z01818 Encounter for other preprocedural examination: Secondary | ICD-10-CM

## 2024-07-13 DIAGNOSIS — Z0000 Encounter for general adult medical examination without abnormal findings: Secondary | ICD-10-CM

## 2024-07-13 DIAGNOSIS — Z23 Encounter for immunization: Secondary | ICD-10-CM

## 2024-07-13 DIAGNOSIS — I10 Essential (primary) hypertension: Secondary | ICD-10-CM

## 2024-07-13 DIAGNOSIS — Z0001 Encounter for general adult medical examination with abnormal findings: Secondary | ICD-10-CM

## 2024-07-13 DIAGNOSIS — E785 Hyperlipidemia, unspecified: Secondary | ICD-10-CM | POA: Diagnosis not present

## 2024-07-13 DIAGNOSIS — C775 Secondary and unspecified malignant neoplasm of intrapelvic lymph nodes: Secondary | ICD-10-CM | POA: Insufficient documentation

## 2024-07-13 DIAGNOSIS — F1021 Alcohol dependence, in remission: Secondary | ICD-10-CM | POA: Diagnosis not present

## 2024-07-13 DIAGNOSIS — R739 Hyperglycemia, unspecified: Secondary | ICD-10-CM | POA: Diagnosis not present

## 2024-07-13 DIAGNOSIS — E039 Hypothyroidism, unspecified: Secondary | ICD-10-CM

## 2024-07-13 DIAGNOSIS — C61 Malignant neoplasm of prostate: Secondary | ICD-10-CM

## 2024-07-13 DIAGNOSIS — K5190 Ulcerative colitis, unspecified, without complications: Secondary | ICD-10-CM

## 2024-07-13 DIAGNOSIS — F1020 Alcohol dependence, uncomplicated: Secondary | ICD-10-CM

## 2024-07-13 LAB — LIPID PANEL
Cholesterol: 113 mg/dL (ref 0–200)
HDL: 32.8 mg/dL — ABNORMAL LOW (ref >39.00)
LDL Cholesterol: 58 mg/dL (ref 0–99)
NonHDL: 80.25
Total CHOL/HDL Ratio: 3
Triglycerides: 110 mg/dL (ref 0.0–149.0)
VLDL: 22 mg/dL (ref 0.0–40.0)

## 2024-07-13 LAB — CBC WITH DIFFERENTIAL/PLATELET
Basophils Absolute: 0 K/uL (ref 0.0–0.1)
Basophils Relative: 0.7 % (ref 0.0–3.0)
Eosinophils Absolute: 0.2 K/uL (ref 0.0–0.7)
Eosinophils Relative: 2.3 % (ref 0.0–5.0)
HCT: 40.6 % (ref 39.0–52.0)
Hemoglobin: 13.6 g/dL (ref 13.0–17.0)
Lymphocytes Relative: 35.2 % (ref 12.0–46.0)
Lymphs Abs: 2.4 K/uL (ref 0.7–4.0)
MCHC: 33.5 g/dL (ref 30.0–36.0)
MCV: 85.8 fl (ref 78.0–100.0)
Monocytes Absolute: 0.4 K/uL (ref 0.1–1.0)
Monocytes Relative: 5.9 % (ref 3.0–12.0)
Neutro Abs: 3.8 K/uL (ref 1.4–7.7)
Neutrophils Relative %: 55.9 % (ref 43.0–77.0)
Platelets: 188 K/uL (ref 150.0–400.0)
RBC: 4.73 Mil/uL (ref 4.22–5.81)
RDW: 13.3 % (ref 11.5–15.5)
WBC: 6.8 K/uL (ref 4.0–10.5)

## 2024-07-13 LAB — COMPREHENSIVE METABOLIC PANEL WITH GFR
ALT: 41 U/L (ref 0–53)
AST: 21 U/L (ref 0–37)
Albumin: 4.5 g/dL (ref 3.5–5.2)
Alkaline Phosphatase: 96 U/L (ref 39–117)
BUN: 16 mg/dL (ref 6–23)
CO2: 24 meq/L (ref 19–32)
Calcium: 9.4 mg/dL (ref 8.4–10.5)
Chloride: 106 meq/L (ref 96–112)
Creatinine, Ser: 0.64 mg/dL (ref 0.40–1.50)
GFR: 99.15 mL/min (ref >60.00)
Glucose, Bld: 119 mg/dL — ABNORMAL HIGH (ref 70–99)
Potassium: 4.2 meq/L (ref 3.5–5.1)
Sodium: 141 meq/L (ref 135–145)
Total Bilirubin: 0.5 mg/dL (ref 0.2–1.2)
Total Protein: 7.6 g/dL (ref 6.0–8.3)

## 2024-07-13 LAB — TSH: TSH: 4.24 u[IU]/mL (ref 0.35–5.50)

## 2024-07-13 LAB — HEMOGLOBIN A1C: Hgb A1c MFr Bld: 6.3 % (ref 4.6–6.5)

## 2024-07-13 NOTE — Telephone Encounter (Signed)
 Will contact Pt tomorrow, however he was informed that pre-op form would be completed and sent to Dr. Advair as soon as we received his labs today.

## 2024-07-13 NOTE — Progress Notes (Signed)
 Subjective:    Patient ID: Gary [name], male    DOB: 1955-12-12, 68 y.o.   MRN: [id number]  DOS:  07/13/2024  Discussed the use of AI scribe software for clinical note transcription with the patient, who gave verbal consent to proceed.  History of Present Illness Here for CPX.  Left foot pain and functional limitation - Scheduled for left foot surgery in two days  - Significant left foot pain for the past six to eight weeks - Foot pain limits ability to ride bicycle or walk   Prostate cancer management - Receiving Eligard  injections every six months - Last PSA reported as satisfactory  Hypertension - Treated with lisinopril , carvedilol , amlodipine , and spironolactone  - Blood pressure has been stable over the past six to eight months  Hyperlipidemia - Treated with aspirin  81 mg daily, Lipitor, and Zetia   Hypothyroidism - Treated with thyroid  medication  Exercise tolerance and constitutional symptoms - Able to climb one flight of stairs without shortness of breath or chest pain, limited by foot pain. - No fever, chest pain, respiratory difficulties, nausea, vomiting, blood in stools, diarrhea, or blood in urine    Review of Systems  Other than above, a 14 point review of systems is negative      Past Medical History:  Diagnosis Date   Anxiety    CAD (coronary artery disease) 10-2012   h/o stents    Charcot-Marie disease 07/28/2019   Elevated PSA    Prostate Bx w/ Dr. Alvaro   EtOH dependence [facility])    Family history of adverse reaction to anesthesia    made father disoriented   Gross hematuria    Hepatic steatosis    Hyperlipidemia LDL goal < 70    Hypertension    Metastatic adenocarcinoma to prostate (HCC) 2017   Gleason score 9, metastasis to R external Iliac node and large extraprostatic extension at prostatectomy   Multiple sclerosis 1991   Bilateral optic neuritis, treated and resolved but felt to be a manifestation of MS.   Renal cyst, left     Bosniak 2K (indeterminant) hyperdense cyst CT 2014, Stable CT in 2016   Ulcerative colitis [facility])     Past Surgical History:  Procedure Laterality Date   FOOT SURGERY     R toe Fx (screws),  L dorsal FX (screws)   LEFT HEART CATHETERIZATION WITH CORONARY ANGIOGRAM N/A 10/22/2012   Procedure: LEFT HEART CATHETERIZATION WITH CORONARY ANGIOGRAM;  Surgeon: Victory LELON Claudene DOUGLAS, MD;  Location: [facility] CATH LAB;  Service: Cardiovascular;  Laterality: N/A;   LYMPHADENECTOMY Bilateral 11/24/2015   Procedure: BILATERAL PELVIC LYMPHADENECTOMY;  Surgeon: Ricardo Alvaro, MD;  Location: [facility];  Service: Urology;  Laterality: Bilateral;   PERCUTANEOUS CORONARY STENT INTERVENTION (PCI-S) N/A 10/23/2012   Procedure: PERCUTANEOUS CORONARY STENT INTERVENTION (PCI-S);  Surgeon: Victory LELON Claudene DOUGLAS, MD;  Location: [facility] CATH LAB;  Service: Cardiovascular;  Laterality: N/A;   PROSTATE BIOPSY  09/2015   Dr. Alvaro   ROBOT ASSISTED LAPAROSCOPIC RADICAL PROSTATECTOMY N/A 11/24/2015   Procedure: XI ROBOTIC ASSISTED LAPAROSCOPIC RADICAL PROSTATECTOMY WITH INDOCYANINE GREEN DYE;  Surgeon: Ricardo Alvaro, MD;  Location: [facility];  Service: Urology;  Laterality: N/A;   VASECTOMY      Current Outpatient Medications  Medication Instructions   amLODipine  (NORVASC ) 10 mg, Oral, Daily   aspirin  81 mg, Daily   atorvastatin  (LIPITOR) 40 mg, Oral, Daily at bedtime   azelastine  (ASTELIN ) 0.1 % nasal spray 2 sprays, Each Nare, 2 times daily PRN, Use  in each nostril as directed   carvedilol  (COREG ) 25 mg, Oral, 2 times daily with meals   ezetimibe  (ZETIA ) 10 mg, Oral, Daily   Leuprolide  Acetate (LUPRON  IJ) Every 6 months   levothyroxine  (SYNTHROID ) 100 mcg, Oral, Daily before breakfast   lisinopril  (ZESTRIL ) 40 mg, Oral, Daily   promethazine -dextromethorphan (PROMETHAZINE -DM) 6.25-15 MG/5ML syrup 5 mLs, Oral, 4 times daily PRN   sertraline  (ZOLOFT ) 100 mg, Oral, Daily   sodium chloride  (OCEAN) 0.65 % SOLN nasal spray 1 spray, As needed    spironolactone  (ALDACTONE ) 12.5 mg, Oral, Daily       Objective:   Physical Exam BP 120/66   Pulse (!) 56   Temp 97.6 F (36.4 C) (Oral)   Resp 16   Ht 6' (1.829 m)   Wt 267 lb (121.1 kg)   SpO2 97%   BMI 36.21 kg/m  General: Well developed, NAD, BMI noted Neck: No  thyromegaly  HEENT:  Normocephalic . Face symmetric, atraumatic Lungs:  CTA B Normal respiratory effort, no intercostal retractions, no accessory muscle use. Heart: RRR,  no murmur.  Abdomen:  Not distended, soft, non-tender. No rebound or rigidity.   Lower extremities: no pretibial edema bilaterally.  Has a left postop shoe Skin: Exposed areas without rash. Not pale. Not jaundice Neurologic:  alert & oriented X3.  Speech normal, gait appropriate for age and unassisted Strength symmetric and appropriate for age.  Psych: Cognition and judgment appear intact.  Cooperative with normal attention span and concentration.  Behavior appropriate. No anxious or depressed appearing.     Assessment    Assessment  Pre-diabetes HTN Hypokalemia: Saw endo 09/09/2019, w/u:no clear evidence of aldosterone producing tumor.  Spironolactone  would be a good addition to her BP regimen Hyperlipidemia Hypothyroidism Anxiety CAD s/p stents 2014  EtOH dependency GI: ---Ulcerative colitis  ---LFTs elevated since 12/13/2020. *++  etoh  *h/o neg Hep B & C serology * Zytiga  started 05-2019 *on atorvastatin  for years.   *Liver US  12/ 2022 : fatty liver and a increased vascularity structure of the gallbladder, next US  10-2023 GU: -Metastatic prostate cancer, DX 11-2015, radical prostatectomy 11-2015 --  continent as off  07-2016--on androgen depravation  NEURO: -MS  Dx 1991 (B neuritis, rx, resolved, felt to be d/t MS) -Bilateral thenar atrophy noted 06-2017 (apparently chronic) -Neuropathy: severe per  [facility] 07-2019 +, likely Charcot Mary Tooth -TGA: DX 09/2018, MRI brain, EEG negative saw neurology in follow-up, carotid ultrasound  ordered: 1 to 39% bilaterally. Nose bleed: R nostril artery cauterize 08-2017   Assessment & Plan  Here for CPX -Td 2025 - pnm 23-2015 and 2022;  prevnar--2017   -S/p Shingrix; s/p RSV - flu shot today - PNM 20 at the next opportunity, COVID booster this fall (having surgery in 2 days) -CCS S/p colonoscopy (~ 2011?) showed adenomatous polyps Cscope 05-2015: mild erythema, no polyps Cscope 06-2019, Dr Donnald  Flex sig: 08-2020 Saw GI, they rec next Cscope 2027 per pt  -H/o Prostate cancer, per urology Labs:  CMP FLP CBC A1c TSH  Other issues Preop clearance received preop clearance yesterday, to have surgery in a couple of days.  Left medial sesamoidectomy and deep tendon transfer along with debridement.  They have requested cardiology clearance.  He has no cardiopulmonary symptoms, EKG no acute and unchanged from previous. He is taking aspirin  and surgery has not requested to stop it.  Will continue aspirin . Plan: Labs, cleared from my side.  He has not seen cardiology in a while, I  could clear from the cardiovascular standpoint if requested CAD: Managed with aspirin , statins, Zetia , beta-blockers..  No symptoms.. Hypertension Well-controlled with current medication regimen.  No change: Lisinopril , carvedilol , amlodipine , spironolactone . - Check CMP and CBC. Hyperlipidemia Managed with atorvastatin  and ezetimibe .  No change. Check labs. Hypothyroidism Managed with levothyroxine . Check TSH. Prostate cancer on active treatment Saw urology 02/02/2024, PSA was undetectable, on  Eligard  per patient.  Alcohol dependence, in remission Reports he is drinking much less than before.  Advised importance of communicate with surgeons how much he drinks to prevent withdrawals while in the hospital RTC 4 months

## 2024-07-13 NOTE — Assessment & Plan Note (Signed)
 Here for CPX -Td 2025 - pnm 23-2015 and 2022;  prevnar--2017   -S/p Shingrix; s/p RSV - flu shot today - PNM 20 at the next opportunity, COVID booster this fall (having surgery in 2 days) -CCS S/p colonoscopy (~ 2011?) showed adenomatous polyps Cscope 05-2015: mild erythema, no polyps Cscope 06-2019, Dr Donnald  Flex sig: 08-2020 Saw GI, they rec next Cscope 2027 per pt  -H/o Prostate cancer, per urology Labs:  CMP FLP CBC A1c TSH

## 2024-07-13 NOTE — Telephone Encounter (Signed)
 Copied from CRM [id number]. Topic: General - Other >> Jul 13, 2024  4:08 PM [facility] ORN wrote: Reason for CRM: Pt called to speak to nurse about appt he had today, 07/13/24. It is regarding surgery. Please call back.

## 2024-07-13 NOTE — Assessment & Plan Note (Signed)
 Here for CPX   Other issues Preop clearance received preop clearance yesterday, to have surgery in a couple of days.  Left medial sesamoidectomy and deep tendon transfer along with debridement.  They have requested cardiology clearance.  He has no cardiopulmonary symptoms, EKG no acute and unchanged from previous. He is taking aspirin  and surgery has not requested to stop it.  Will continue aspirin . Plan: Labs, cleared from my side.  He has not seen cardiology in a while, I could clear from the cardiovascular standpoint if requested CAD: Managed with aspirin , statins, Zetia , beta-blockers..  No symptoms.. Hypertension Well-controlled with current medication regimen.  No change: Lisinopril , carvedilol , amlodipine , spironolactone . - Check CMP and CBC. Hyperlipidemia Managed with atorvastatin  and ezetimibe .  No change. Check labs. Hypothyroidism Managed with levothyroxine . Check TSH. Prostate cancer on active treatment Saw urology 02/02/2024, PSA was undetectable, on  Eligard  per patient.  Alcohol dependence, in remission Reports he is drinking much less than before.  Advised importance of communicate with surgeons how much he drinks to prevent withdrawals while in the hospital RTC 4 months

## 2024-07-13 NOTE — Patient Instructions (Addendum)
 GO TO THE LAB :  Get the blood work    Then, go to the front desk for the checkout Please make an appointment for a checkup in 4 months    You got a flu shot today. Please proceed with COVID booster this fall  Check the  blood pressure regularly Blood pressure goal:  between 110/65 and  135/85. If it is consistently higher or lower, let me know     Please read more detailed instructions below   PREOPERATIVE CLEARANCE FOR LEFT FOOT SURGERY: You are scheduled for left foot surgery in two days to address sesamoid issues. You have been experiencing significant pain and functional limitations. -You are cleared for surgery from an internal medicine perspective. -Continue taking aspirin  81 mg daily. -Follow up with cardiology for final clearance.  CORONARY ARTERY DISEASE: You are managing this condition with aspirin  and other cardiovascular medications.  -Continue taking aspirin  81 mg daily. -Cardiology clearance is requested for your upcoming surgery.  HYPERTENSION: Your blood pressure is well-controlled with your current medications. -Continue taking your current antihypertensive medications: lisinopril , carvedilol , amlodipine , and spironolactone . -We will check your CMP and CBC.  HYPERLIPIDEMIA: You are managing this condition with atorvastatin  and ezetimibe . -Continue taking atorvastatin  and ezetimibe . -We will check your labs.  HYPOTHYROIDISM: You are managing this condition with levothyroxine . -We will check your TSH levels.

## 2024-07-14 ENCOUNTER — Other Ambulatory Visit: Payer: Self-pay

## 2024-07-14 ENCOUNTER — Encounter (HOSPITAL_COMMUNITY): Payer: Self-pay | Admitting: Orthopaedic Surgery

## 2024-07-14 NOTE — Telephone Encounter (Signed)
 Received fax confirmation

## 2024-07-14 NOTE — Telephone Encounter (Signed)
 Form completed and faxed back to ATTN: Paula Jorgensen at [phone]. Form sent for scanning.

## 2024-07-14 NOTE — Telephone Encounter (Signed)
 LMOM informing Pt that surgery clearance form has been faxed over to Dr. Isiah office.

## 2024-07-14 NOTE — Progress Notes (Signed)
 Anesthesia Chart Review: Same day workup  68 year old male former smoker (quit 1991) with pertinent history including HTN, HLD, hypothyroidism, ulcerative colitis, EtOH abuse in remission, prostate cancer s/p radical prostatectomy 2017, CAD s/p DES to circumflex and LAD 10/2012.  Following DES in 10/2012 patient followed for several years with cardiologist Dr. Victory Sharps.  He was last seen in 2016 but at that time transitioned to following with PCP Dr. Aloysius Mech for all chronic conditions.  He was seen by Dr. Florette 07/13/2024 for annual follow-up as well as preop clearance.  He was noted to be able to climb a flight of stairs without SOB or chest pain.  Normally walks and rides a bike for exercise but this is limited by foot pain.  Per note, Preop clearance received preop clearance yesterday, to have surgery in a couple of days.  Left medial sesamoidectomy and deep tendon transfer along with debridement.  They have requested cardiology clearance.  He has no cardiopulmonary symptoms, EKG no acute and unchanged from previous. He is taking aspirin  and surgery has not requested to stop it.  Will continue aspirin . Plan: Labs, cleared from my side.  He has not seen cardiology in a while, I could clear from the cardiovascular standpoint if requested.  CMP and CBC 07/13/2024 reviewed, unremarkable.  EKG 07/13/2024: Sinus Bradycardia.  Rate 54.  First degree A-V block   Cath/PCI 10/23/2012: IMPRESSIONS:  Successful DES implantation in the mid LAD with reduction and 99% stenosis with TIMI grade 2 flow to 0% with TIMI grade 3 flow.     RECOMMENDATION:  Aspirin  and Brilinta   Cath/PCI 10/22/2012: IMPRESSIONS:  1. Acute coronary syndrome due to high-grade disease in the LAD and circumflex. There is intermedius stenosis in the proximal RCA.   2. Successful stenting of the circumflex from 95% to 0% with TIMI grade 3 flow using DES.   3. Residual high-grade mid LAD disease   4. Normal left ventricular function.     RECOMMENDATION:  1. PCI of the LAD in a.m.   2. Aggressive risk factor modification.      Lynwood Geofm RIGGERS [facility]/Anesthesiology Phone [phone] 07/14/2024 11:41 AM

## 2024-07-14 NOTE — Progress Notes (Signed)
 PCP - Dr Aloysius Mech (surgical clearance 07/13/24) Cardiologist - Dr Victory Sharps III (last ov 2016) none  Chest x-ray - n/a EKG - 07/13/24 Stress Test - 2013 @ [facility] - n/a Cardiac Cath - 10/22/12  ICD Pacemaker/Loop - n/a  Sleep Study -  n/a  Diabetes - n/a  Blood Thinner Instructions:  none  Aspirin  Instructions: Follow your surgeon's instructions on when to stop aspirin  prior to surgery.  If no instructions were given by your surgeon then you will need to call the office for those instructions.  ERAS - clear liquids 12 noon DOS  Anesthesia review: Yes  STOP now taking any Aspirin  (unless otherwise instructed by your surgeon), Aleve, Naproxen, Ibuprofen, Motrin, Advil, Goody's, BC's, all herbal medications, fish oil, and all vitamins.   Coronavirus Screening Do you have any of the following symptoms:  Cough yes/no: No Fever (>100.54F)  yes/no: No Runny nose yes/no: No Sore throat yes/no: No Difficulty breathing/shortness of breath  yes/no: No  Have you traveled in the last 14 days and where? yes/no: No  Patient verbalized understanding of instructions that were given via phone.

## 2024-07-14 NOTE — Anesthesia Preprocedure Evaluation (Signed)
 Anesthesia Evaluation  Patient identified by MRN, date of birth, ID band Patient awake    Reviewed: Allergy & Precautions, NPO status , Patient's Chart, lab work & pertinent test results, reviewed documented beta blocker date and time   History of Anesthesia Complications Negative for: history of anesthetic complications  Airway Mallampati: II  TM Distance: >3 FB Neck ROM: Full    Dental  (+) Dental Advisory Given   Pulmonary former smoker   breath sounds clear to auscultation       Cardiovascular hypertension, Pt. on medications and Pt. on home beta blockers (-) angina + CAD and + Cardiac Stents   Rhythm:Regular Rate:Normal     Neuro/Psych   Anxiety Depression    negative neurological ROS     GI/Hepatic negative GI ROS, Neg liver ROS,,,  Endo/Other  Hypothyroidism  BMI 36  Renal/GU negative Renal ROS     Musculoskeletal   Abdominal   Peds  Hematology Hb 13.6, plt 188k   Anesthesia Other Findings Prostate cancer  Reproductive/Obstetrics                              Anesthesia Physical Anesthesia Plan  ASA: 3  Anesthesia Plan: General   Post-op Pain Management: Tylenol  PO (pre-op)* and Ofirmev  IV (intra-op)*   Induction: Intravenous  PONV Risk Score and Plan: 2 and Ondansetron  and Dexamethasone   Airway Management Planned: LMA  Additional Equipment: None  Intra-op Plan:   Post-operative Plan:   Informed Consent: I have reviewed the patients History and Physical, chart, labs and discussed the procedure including the risks, benefits and alternatives for the proposed anesthesia with the patient or authorized representative who has indicated his/her understanding and acceptance.     Dental advisory given  Plan Discussed with: CRNA and Surgeon  Anesthesia Plan Comments: (PAT note by Lynwood Hope, PA-C: 68 year old male former smoker (quit 1991) with pertinent history  including HTN, HLD, hypothyroidism, ulcerative colitis, EtOH abuse in remission, prostate cancer s/p radical prostatectomy 2017, CAD s/p DES to circumflex and LAD 10/2012.  Following DES in 10/2012 patient followed for several years with cardiologist Dr. Victory Sharps.  He was last seen in 2016 but at that time transitioned to following with PCP Dr. Aloysius Mech for all chronic conditions.  He was seen by Dr. Florette 07/13/2024 for annual follow-up as well as preop clearance.  He was noted to be able to climb a flight of stairs without SOB or chest pain.  Normally walks and rides a bike for exercise but this is limited by foot pain.  Per note, Preop clearance received preop clearance yesterday, to have surgery in a couple of days.  Left medial sesamoidectomy and deep tendon transfer along with debridement.  They have requested cardiology clearance.  He has no cardiopulmonary symptoms, EKG no acute and unchanged from previous. He is taking aspirin  and surgery has not requested to stop it.  Will continue aspirin . Plan: Labs, cleared from my side.  He has not seen cardiology in a while, I could clear from the cardiovascular standpoint if requested.  CMP and CBC 07/13/2024 reviewed, unremarkable.  EKG 07/13/2024: Sinus Bradycardia.  Rate 54.  First degree A-V block   Cath/PCI 10/23/2012: IMPRESSIONS:  Successful DES implantation in the mid LAD with reduction and 99% stenosis with TIMI grade 2 flow to 0% with TIMI grade 3 flow.   RECOMMENDATION:  Aspirin  and Brilinta   Cath/PCI 10/22/2012: IMPRESSIONS:  1. Acute coronary syndrome  due to high-grade disease in the LAD and circumflex. There is intermedius stenosis in the proximal RCA.  2. Successful stenting of the circumflex from 95% to 0% with TIMI grade 3 flow using DES.  3. Residual high-grade mid LAD disease  4. Normal left ventricular function.   RECOMMENDATION:  1. PCI of the LAD in a.m.  2. Aggressive risk factor modification.    )          Anesthesia Quick Evaluation

## 2024-07-15 ENCOUNTER — Other Ambulatory Visit: Payer: Self-pay

## 2024-07-15 ENCOUNTER — Ambulatory Visit (HOSPITAL_COMMUNITY)
Admission: RE | Admit: 2024-07-15 | Discharge: 2024-07-15 | Disposition: A | Discharge status: Home / Self Care | Attending: Orthopaedic Surgery | Admitting: Orthopaedic Surgery

## 2024-07-15 ENCOUNTER — Ambulatory Visit (HOSPITAL_COMMUNITY): Admitting: Physician Assistant

## 2024-07-15 ENCOUNTER — Other Ambulatory Visit (HOSPITAL_COMMUNITY): Payer: Self-pay

## 2024-07-15 ENCOUNTER — Encounter (HOSPITAL_COMMUNITY)
Admission: RE | Disposition: A | Discharge status: Home / Self Care | Payer: Self-pay | Source: Home / Self Care | Attending: Orthopaedic Surgery

## 2024-07-15 ENCOUNTER — Ambulatory Visit: Payer: Self-pay | Admitting: Internal Medicine

## 2024-07-15 ENCOUNTER — Encounter (HOSPITAL_COMMUNITY): Payer: Self-pay | Admitting: Orthopaedic Surgery

## 2024-07-15 DIAGNOSIS — M89372 Hypertrophy of bone, left ankle and foot: Secondary | ICD-10-CM | POA: Diagnosis not present

## 2024-07-15 DIAGNOSIS — Z955 Presence of coronary angioplasty implant and graft: Secondary | ICD-10-CM | POA: Insufficient documentation

## 2024-07-15 DIAGNOSIS — M25872 Other specified joint disorders, left ankle and foot: Secondary | ICD-10-CM | POA: Diagnosis present

## 2024-07-15 DIAGNOSIS — I2510 Atherosclerotic heart disease of native coronary artery without angina pectoris: Secondary | ICD-10-CM

## 2024-07-15 DIAGNOSIS — Z7982 Long term (current) use of aspirin: Secondary | ICD-10-CM | POA: Diagnosis not present

## 2024-07-15 DIAGNOSIS — Z79899 Other long term (current) drug therapy: Secondary | ICD-10-CM | POA: Diagnosis not present

## 2024-07-15 DIAGNOSIS — F32A Depression, unspecified: Secondary | ICD-10-CM | POA: Diagnosis not present

## 2024-07-15 DIAGNOSIS — B351 Tinea unguium: Secondary | ICD-10-CM | POA: Insufficient documentation

## 2024-07-15 DIAGNOSIS — E039 Hypothyroidism, unspecified: Secondary | ICD-10-CM | POA: Diagnosis not present

## 2024-07-15 DIAGNOSIS — I10 Essential (primary) hypertension: Secondary | ICD-10-CM

## 2024-07-15 DIAGNOSIS — F419 Anxiety disorder, unspecified: Secondary | ICD-10-CM | POA: Insufficient documentation

## 2024-07-15 DIAGNOSIS — Z87891 Personal history of nicotine dependence: Secondary | ICD-10-CM

## 2024-07-15 DIAGNOSIS — L89899 Pressure ulcer of other site, unspecified stage: Secondary | ICD-10-CM | POA: Diagnosis not present

## 2024-07-15 DIAGNOSIS — L02612 Cutaneous abscess of left foot: Secondary | ICD-10-CM | POA: Insufficient documentation

## 2024-07-15 DIAGNOSIS — Z79890 Hormone replacement therapy (postmenopausal): Secondary | ICD-10-CM | POA: Insufficient documentation

## 2024-07-15 DIAGNOSIS — M79672 Pain in left foot: Secondary | ICD-10-CM

## 2024-07-15 HISTORY — DX: Hypothyroidism, unspecified: E03.9

## 2024-07-15 SURGERY — EXCISION, SESAMOID BONE
Anesthesia: General | Laterality: Left

## 2024-07-15 MED ORDER — ACETAMINOPHEN 10 MG/ML IV SOLN
INTRAVENOUS | Status: DC | PRN
  Administered 2024-07-15: 1000 mg via INTRAVENOUS

## 2024-07-15 MED ORDER — CHLORHEXIDINE GLUCONATE 0.12 % MT SOLN
15.0000 mL | Freq: Once | OROMUCOSAL | Status: AC
  Administered 2024-07-15: 15 mL via OROMUCOSAL
  Filled 2024-07-15: qty 15

## 2024-07-15 MED ORDER — PROPOFOL 10 MG/ML IV BOLUS
INTRAVENOUS | Status: AC
  Filled 2024-07-15: qty 20

## 2024-07-15 MED ORDER — EPHEDRINE 5 MG/ML INJ
INTRAVENOUS | Status: AC
  Filled 2024-07-15: qty 5

## 2024-07-15 MED ORDER — ORAL CARE MOUTH RINSE: 15.0000 mL | Freq: Once | OROMUCOSAL | Status: AC

## 2024-07-15 MED ORDER — CEFAZOLIN SODIUM-DEXTROSE 3-4 GM/150ML-% IV SOLN
3.0000 g | INTRAVENOUS | Status: AC
  Administered 2024-07-15: 3 g via INTRAVENOUS
  Filled 2024-07-15: qty 150

## 2024-07-15 MED ORDER — FENTANYL CITRATE (PF) 250 MCG/5ML IJ SOLN
INTRAMUSCULAR | Status: DC | PRN
  Administered 2024-07-15 (×2): 50 ug via INTRAVENOUS
  Administered 2024-07-15: 25 ug via INTRAVENOUS

## 2024-07-15 MED ORDER — OXYCODONE HCL 5 MG PO TABS
5.0000 mg | ORAL_TABLET | ORAL | 0 refills | Status: AC | PRN
  Filled 2024-07-15: qty 30, 5d supply, fill #0

## 2024-07-15 MED ORDER — MEPERIDINE HCL 25 MG/ML IJ SOLN: 6.2500 mg | INTRAMUSCULAR | Status: DC | PRN

## 2024-07-15 MED ORDER — MIDAZOLAM HCL 2 MG/2ML IJ SOLN: 0.5000 mg | Freq: Once | INTRAMUSCULAR | Status: DC | PRN

## 2024-07-15 MED ORDER — 0.9 % SODIUM CHLORIDE (POUR BTL) OPTIME
TOPICAL | Status: DC | PRN
Start: 2024-07-15 — End: 2024-07-15
  Administered 2024-07-15: 1000 mL

## 2024-07-15 MED ORDER — BUPIVACAINE HCL (PF) 0.25 % IJ SOLN
INTRAMUSCULAR | Status: AC
  Filled 2024-07-15: qty 30

## 2024-07-15 MED ORDER — ONDANSETRON HCL 4 MG/2ML IJ SOLN
INTRAMUSCULAR | Status: AC
  Filled 2024-07-15: qty 2

## 2024-07-15 MED ORDER — EPHEDRINE SULFATE-NACL 50-0.9 MG/10ML-% IV SOSY
PREFILLED_SYRINGE | INTRAVENOUS | Status: DC | PRN
  Administered 2024-07-15 (×3): 10 mg via INTRAVENOUS

## 2024-07-15 MED ORDER — MIDAZOLAM HCL 2 MG/2ML IJ SOLN
INTRAMUSCULAR | Status: DC | PRN
  Administered 2024-07-15: 2 mg via INTRAVENOUS

## 2024-07-15 MED ORDER — OXYCODONE HCL 5 MG/5ML PO SOLN: 5.0000 mg | Freq: Once | ORAL | Status: DC | PRN

## 2024-07-15 MED ORDER — DEXAMETHASONE SOD PHOSPHATE PF 10 MG/ML IJ SOLN
INTRAMUSCULAR | Status: DC | PRN
  Administered 2024-07-15: 5 mg via INTRAVENOUS

## 2024-07-15 MED ORDER — OXYCODONE HCL 5 MG PO TABS: 5.0000 mg | ORAL_TABLET | Freq: Once | ORAL | Status: DC | PRN

## 2024-07-15 MED ORDER — POVIDONE-IODINE 7.5 % EX SOLN
Freq: Once | CUTANEOUS | Status: DC
  Filled 2024-07-15: qty 118

## 2024-07-15 MED ORDER — VANCOMYCIN HCL 500 MG IV SOLR
INTRAVENOUS | Status: AC
  Filled 2024-07-15: qty 10

## 2024-07-15 MED ORDER — MIDAZOLAM HCL 2 MG/2ML IJ SOLN
INTRAMUSCULAR | Status: AC
  Filled 2024-07-15: qty 2

## 2024-07-15 MED ORDER — VANCOMYCIN HCL 500 MG IV SOLR
INTRAVENOUS | Status: DC | PRN
  Administered 2024-07-15: 500 mg

## 2024-07-15 MED ORDER — HYDROMORPHONE HCL 1 MG/ML IJ SOLN: 0.2500 mg | INTRAMUSCULAR | Status: DC | PRN

## 2024-07-15 MED ORDER — ONDANSETRON HCL 4 MG/2ML IJ SOLN
INTRAMUSCULAR | Status: DC | PRN
  Administered 2024-07-15: 4 mg via INTRAVENOUS

## 2024-07-15 MED ORDER — BUPIVACAINE HCL 0.25 % IJ SOLN
INTRAMUSCULAR | Status: DC | PRN
Start: 2024-07-15 — End: 2024-07-15
  Administered 2024-07-15: 30 mL

## 2024-07-15 MED ORDER — LACTATED RINGERS IV SOLN: INTRAVENOUS | Status: DC

## 2024-07-15 MED ORDER — ACETAMINOPHEN 10 MG/ML IV SOLN
INTRAVENOUS | Status: AC
  Filled 2024-07-15: qty 100

## 2024-07-15 MED ORDER — FENTANYL CITRATE (PF) 250 MCG/5ML IJ SOLN
INTRAMUSCULAR | Status: AC
  Filled 2024-07-15: qty 5

## 2024-07-15 SURGICAL SUPPLY — 71 items
ALCOHOL 70% 16 OZ (MISCELLANEOUS) ×1 IMPLANT
BAG COUNTER SPONGE SURGICOUNT (BAG) ×1 IMPLANT
BANDAGE ESMARK 6X9 LF (GAUZE/BANDAGES/DRESSINGS) IMPLANT
BENZOIN TINCTURE PRP APPL 2/3 (GAUZE/BANDAGES/DRESSINGS) IMPLANT
BLADE SURG 15 STRL LF DISP TIS (BLADE) ×1 IMPLANT
BNDG COHESIVE 1X5 TAN STRL LF (GAUZE/BANDAGES/DRESSINGS) IMPLANT
BNDG COHESIVE 4X5 TAN STRL LF (GAUZE/BANDAGES/DRESSINGS) IMPLANT
BNDG COHESIVE 6X5 TAN ST LF (GAUZE/BANDAGES/DRESSINGS) IMPLANT
BNDG COMPR ESMARK 4X3 LF (GAUZE/BANDAGES/DRESSINGS) ×1 IMPLANT
BNDG ELASTIC 4INX 5YD STR LF (GAUZE/BANDAGES/DRESSINGS) IMPLANT
BNDG ELASTIC 4X5.8 VLCR STR LF (GAUZE/BANDAGES/DRESSINGS) ×2 IMPLANT
BNDG ELASTIC 6INX 5YD STR LF (GAUZE/BANDAGES/DRESSINGS) IMPLANT
BNDG ELASTIC 6X10 VLCR STRL LF (GAUZE/BANDAGES/DRESSINGS) ×1 IMPLANT
BNDG GAUZE DERMACEA FLUFF 4 (GAUZE/BANDAGES/DRESSINGS) ×1 IMPLANT
CANISTER SUCTION 3000ML PPV (SUCTIONS) ×1 IMPLANT
CHLORAPREP W/TINT 26 (MISCELLANEOUS) ×2 IMPLANT
COVER SURGICAL LIGHT HANDLE (MISCELLANEOUS) ×2 IMPLANT
CUFF TOURN SGL QUICK 42 (TOURNIQUET CUFF) IMPLANT
CUFF TRNQT CYL 34X4.125X (TOURNIQUET CUFF) ×1 IMPLANT
DRAPE C-ARMOR (DRAPES) IMPLANT
DRAPE IMP U-DRAPE 54X76 (DRAPES) ×1 IMPLANT
DRAPE OEC MINIVIEW 54X84 (DRAPES) ×1 IMPLANT
DRAPE U-SHAPE 47X51 STRL (DRAPES) ×1 IMPLANT
DRSG MEPITEL 4X7.2 (GAUZE/BANDAGES/DRESSINGS) ×1 IMPLANT
DRSG XEROFORM 1X8 (GAUZE/BANDAGES/DRESSINGS) ×1 IMPLANT
DURAPREP 26ML APPLICATOR (WOUND CARE) ×1 IMPLANT
ELECTRODE REM PT RTRN 9FT ADLT (ELECTROSURGICAL) ×1 IMPLANT
GAUZE PAD ABD 8X10 STRL (GAUZE/BANDAGES/DRESSINGS) ×2 IMPLANT
GAUZE SPONGE 4X4 12PLY STRL (GAUZE/BANDAGES/DRESSINGS) ×1 IMPLANT
GAUZE SPONGE 4X4 12PLY STRL LF (GAUZE/BANDAGES/DRESSINGS) ×1 IMPLANT
GAUZE XEROFORM 1X8 LF (GAUZE/BANDAGES/DRESSINGS) ×1 IMPLANT
GLOVE BIO SURGEON STRL SZ7.5 (GLOVE) ×1 IMPLANT
GLOVE BIOGEL M STRL SZ7.5 (GLOVE) ×1 IMPLANT
GLOVE BIOGEL PI IND STRL 8 (GLOVE) ×1 IMPLANT
GLOVE INDICATOR 8.0 STRL GRN (GLOVE) ×1 IMPLANT
GLOVE SRG 8 PF TXTR STRL LF DI (GLOVE) ×1 IMPLANT
GLOVE SURG ENC TEXT LTX SZ7.5 (GLOVE) ×1 IMPLANT
GOWN STRL REUS W/ TWL LRG LVL3 (GOWN DISPOSABLE) ×1 IMPLANT
GOWN STRL REUS W/ TWL XL LVL3 (GOWN DISPOSABLE) ×2 IMPLANT
KIT BASIN OR (CUSTOM PROCEDURE TRAY) ×1 IMPLANT
KIT TURNOVER KIT B (KITS) ×1 IMPLANT
MANIFOLD NEPTUNE II (INSTRUMENTS) ×1 IMPLANT
NDL 22X1.5 STRL (OR ONLY) (MISCELLANEOUS) IMPLANT
NEEDLE 22X1.5 STRL (OR ONLY) (MISCELLANEOUS) ×1 IMPLANT
PACK ORTHO EXTREMITY (CUSTOM PROCEDURE TRAY) ×2 IMPLANT
PAD ARMBOARD POSITIONER FOAM (MISCELLANEOUS) ×2 IMPLANT
PAD CAST 4YDX4 CTTN HI CHSV (CAST SUPPLIES) ×1 IMPLANT
PAD CAST CTTN 4X4 STRL (SOFTGOODS) IMPLANT
PADDING CAST SYNTHETIC 4X4 STR (CAST SUPPLIES) ×1 IMPLANT
SET HNDPC FAN SPRY TIP SCT (DISPOSABLE) ×1 IMPLANT
SOLN 0.9% NACL 1000 ML (IV SOLUTION) ×1 IMPLANT
SOLN 0.9% NACL POUR BTL 1000ML (IV SOLUTION) ×1 IMPLANT
SPIKE FLUID TRANSFER (MISCELLANEOUS) IMPLANT
SPONGE T-LAP 18X18 ~~LOC~~+RFID (SPONGE) ×1 IMPLANT
STOCKINETTE IMPERVIOUS 9X36 MD (GAUZE/BANDAGES/DRESSINGS) IMPLANT
STRIP CLOSURE SKIN 1/2X4 (GAUZE/BANDAGES/DRESSINGS) IMPLANT
SUCTION TUBE FRAZIER 10FR DISP (SUCTIONS) ×1 IMPLANT
SUT ETHILON 2 0 FS 18 (SUTURE) IMPLANT
SUT ETHILON 3 0 PS 1 (SUTURE) ×1 IMPLANT
SUT MNCRL AB 3-0 PS2 18 (SUTURE) ×1 IMPLANT
SUT MNCRL AB 3-0 PS2 27 (SUTURE) ×1 IMPLANT
SUT PDS AB 2-0 CT2 27 (SUTURE) ×1 IMPLANT
SUT VIC AB 2-0 CT1 TAPERPNT 27 (SUTURE) ×2 IMPLANT
SUT VIC AB 3-0 FS2 27 (SUTURE) IMPLANT
SUTURE FIBERWR 2-0 18 17.9 3/8 (SUTURE) IMPLANT
TOWEL GREEN STERILE (TOWEL DISPOSABLE) ×1 IMPLANT
TOWEL GREEN STERILE FF (TOWEL DISPOSABLE) ×2 IMPLANT
TUBE CONNECTING 12X1/4 (SUCTIONS) ×1 IMPLANT
TUBE CONNECTING 20X1/4 (TUBING) ×1 IMPLANT
UNDERPAD 30X36 HEAVY ABSORB (UNDERPADS AND DIAPERS) ×1 IMPLANT
YANKAUER SUCT BULB TIP NO VENT (SUCTIONS) ×1 IMPLANT

## 2024-07-15 NOTE — Discharge Instructions (Signed)
 DR. Susa Simmonds FOOT & ANKLE SURGERY POST-OP INSTRUCTIONS   Pain Management The numbing medicine and your leg will last around 18 hours, take a dose of your pain medicine as soon as you feel it wearing off to avoid rebound pain. Keep your foot elevated above heart level.  Make sure that your heel hangs free ('floats'). Take all prescribed medication as directed. If taking narcotic pain medication you may want to use an over-the-counter stool softener to avoid constipation. You may take over-the-counter NSAIDs (ibuprofen, naproxen, etc.) as well as over-the-counter acetaminophen as directed on the packaging as a supplement for your pain and may also use it to wean away from the prescription medication.  Activity Heel WB in post operative shoe. Keep dressing in place  First Postoperative Visit Your first postop visit will be at least 2 weeks after surgery.  This should be scheduled when you schedule surgery. If you do not have a postoperative visit scheduled please call [phone] to schedule an appointment. At the appointment your incision will be evaluated for suture removal, x-rays will be obtained if necessary.  General Instructions Swelling is very common after foot and ankle surgery.  It often takes 3 months for the foot and ankle to begin to feel comfortable.  Some amount of swelling will persist for 6-12 months. DO NOT change the dressing.  If there is a problem with the dressing (too tight, loose, gets wet, etc.) please contact Dr. Donnie Mesa office. DO NOT get the dressing wet.  For showers you can use an over-the-counter cast cover or wrap a washcloth around the top of your dressing and then cover it with a plastic bag and tape it to your leg. DO NOT soak the incision (no tubs, pools, bath, etc.) until you have approval from Dr. Susa Simmonds.  Contact Dr. Garret Reddish office or go to Emergency Room if: Temperature above 101 F. Increasing pain that is unresponsive to pain medication or  elevation Excessive redness or swelling in your foot Dressing problems - excessive bloody drainage, looseness or tightness, or if dressing gets wet Develop pain, swelling, warmth, or discoloration of your calf

## 2024-07-15 NOTE — Anesthesia Procedure Notes (Signed)
 Procedure Name: LMA Insertion Date/Time: 07/15/2024 2:46 PM  Performed by: Jolynn Mage, CRNAPre-anesthesia Checklist: Patient identified, Emergency Drugs available, Suction available and Patient being monitored Patient Re-evaluated:Patient Re-evaluated prior to induction Oxygen Delivery Method: Circle system utilized Preoxygenation: Pre-oxygenation with 100% oxygen Induction Type: IV induction Ventilation: Mask ventilation without difficulty LMA: LMA flexible inserted LMA Size: 5.0 Number of attempts: 1 Placement Confirmation: positive ETCO2 and breath sounds checked- equal and bilateral Tube secured with: Tape Dental Injury: Teeth and Oropharynx as per pre-operative assessment

## 2024-07-15 NOTE — Transfer of Care (Signed)
 Immediate Anesthesia Transfer of Care Note  Patient: Gary [name]  Procedure(s) Performed: EXCISION, SESAMOID BONE (Left) IRRIGATION AND DEBRIDEMENT WOUND (Left)  Patient Location: PACU  Anesthesia Type:General  Level of Consciousness: awake, alert , and oriented  Airway & Oxygen Therapy: Patient Spontanous Breathing and Patient connected to face mask oxygen  Post-op Assessment: Report given to RN and Post -op Vital signs reviewed and stable  Post vital signs: Reviewed and stable  Last Vitals:  Vitals Value Taken Time  BP 134/68 07/15/24 15:30  Temp 36.6 C 07/15/24 15:30  Pulse 52 07/15/24 15:34  Resp 13 07/15/24 15:34  SpO2 97 % 07/15/24 15:34  Vitals shown include unfiled device data.  Last Pain:  Vitals:   07/15/24 1530  TempSrc:   PainSc: 0-No pain      Patients Stated Pain Goal: 0 (07/15/24 1245)  Complications: No notable events documented.

## 2024-07-15 NOTE — H&P (Signed)
 PREOPERATIVE H&P  Chief Complaint: Left foot pain and chronic ulceration  HPI: Gary [name] is a 68 y.o. male who presents for preoperative history and physical with a diagnosis of left foot medial sesamoid hypertrophy with underlying chronic ulceration and wound.  He is here today for sesamoidectomy and treatment of his wound.. Symptoms are rated as moderate to severe, and have been worsening.  This is significantly impairing activities of daily living.  He has elected for surgical management.   Past Medical History:  Diagnosis Date   Anxiety    CAD (coronary artery disease) 10/2012   h/o stents    Charcot-Marie disease 07/28/2019   Elevated PSA    Prostate Bx w/ Dr. Alvaro   EtOH dependence [facility])    Family history of adverse reaction to anesthesia    made father disoriented   Gross hematuria    Hepatic steatosis    Hyperlipidemia LDL goal < 70    Hypertension    Hypothyroidism    Metastatic adenocarcinoma to prostate (HCC) 2017   Gleason score 9, metastasis to R external Iliac node and large extraprostatic extension at prostatectomy   Multiple sclerosis 1991   Bilateral optic neuritis, treated and resolved but felt to be a manifestation of MS.   Renal cyst, left    Bosniak 2K (indeterminant) hyperdense cyst CT 2014, Stable CT in 2016   Ulcerative colitis (HCC)    Past Surgical History:  Procedure Laterality Date   COLONOSCOPY     x several   FOOT SURGERY     R toe Fx (screws),  L dorsal FX (screws)   LEFT HEART CATHETERIZATION WITH CORONARY ANGIOGRAM N/A 10/22/2012   Procedure: LEFT HEART CATHETERIZATION WITH CORONARY ANGIOGRAM;  Surgeon: Victory LELON Claudene DOUGLAS, MD;  Location: [facility] CATH LAB;  Service: Cardiovascular;  Laterality: N/A;   LYMPHADENECTOMY Bilateral 11/24/2015   Procedure: BILATERAL PELVIC LYMPHADENECTOMY;  Surgeon: Ricardo Alvaro, MD;  Location: [facility] ORS;  Service: Urology;  Laterality: Bilateral;   PERCUTANEOUS CORONARY STENT INTERVENTION (PCI-S) N/A 10/23/2012    Procedure: PERCUTANEOUS CORONARY STENT INTERVENTION (PCI-S);  Surgeon: Victory LELON Claudene DOUGLAS, MD;  Location: [facility] CATH LAB;  Service: Cardiovascular;  Laterality: N/A;   PROSTATE BIOPSY  09/07/2015   Dr. Alvaro   ROBOT ASSISTED LAPAROSCOPIC RADICAL PROSTATECTOMY N/A 11/24/2015   Procedure: XI ROBOTIC ASSISTED LAPAROSCOPIC RADICAL PROSTATECTOMY WITH INDOCYANINE GREEN DYE;  Surgeon: Ricardo Alvaro, MD;  Location: [facility] ORS;  Service: Urology;  Laterality: N/A;   VASECTOMY     Social History   Socioeconomic History   Marital status: Widowed    Spouse name: Not on file   Number of children: 1   Years of education: Not on file   Highest education level: Bachelor's degree (e.g., BA, AB, BS)  Occupational History   Occupation: disable   Tobacco Use   Smoking status: Former    Current packs/day: 0.00    Types: Cigarettes    Quit date: 07/18/1990    Years since quitting: 34.0   Smokeless tobacco: Never   Tobacco comments:    1991  Vaping Use   Vaping status: Never Used  Substance and Sexual Activity   Alcohol use: Not Currently    Comment: no qd but occ heavy   Drug use: Yes    Types: Marijuana    Comment: last dose first of oct 2025   Sexual activity: Not Currently    Birth control/protection: None  Other Topics Concern   Not on file  Social History Narrative  Lives by himself    Has an adult son   Social Drivers of Corporate investment banker Strain: Patient Declined (07/11/2024)   Overall Financial Resource Strain (CARDIA)    Difficulty of Paying Living Expenses: Patient declined  Food Insecurity: Unknown (07/11/2024)   Hunger Vital Sign    Worried About Running Out of Food in the Last Year: Patient declined    Ran Out of Food in the Last Year: Never true  Transportation Needs: No Transportation Needs (07/11/2024)   PRAPARE - Administrator, Civil Service (Medical): No    Lack of Transportation (Non-Medical): No  Physical Activity: Unknown (07/11/2024)   Exercise Vital  Sign    Days of Exercise per Week: Patient declined    Minutes of Exercise per Session: Not on file  Stress: Patient Declined (07/11/2024)   [employer] of Occupational Health - Occupational Stress Questionnaire    Feeling of Stress: Patient declined  Social Connections: Socially Isolated (07/11/2024)   Social Connection and Isolation Panel    Frequency of Communication with Friends and Family: More than three times a week    Frequency of Social Gatherings with Friends and Family: Patient declined    Attends Religious Services: Patient declined    Database administrator or Organizations: No    Attends Engineer, structural: Not on file    Marital Status: Widowed   Family History  Problem Relation Age of Onset   Dementia Mother    Hypertension Mother    Coronary artery disease Father    Diabetes Mellitus II Father    Lupus Sister    Thyroid  cancer Sister    Heart failure Sister    Coronary artery disease Brother    Diabetes Brother    Hypertension Brother    Prostate cancer Brother    Colon cancer Neg Hx    No Known Allergies Prior to Admission medications   Medication Sig Start Date End Date Taking? Authorizing Provider  amLODipine  (NORVASC ) 10 MG tablet Take 1 tablet (10 mg total) by mouth daily. 04/22/24  Yes Paz, Aloysius BRAVO, MD  aspirin  81 MG tablet Take 81 mg by mouth daily.   Yes [provider]  atorvastatin  (LIPITOR) 40 MG tablet Take 1 tablet (40 mg total) by mouth at bedtime. 04/22/24  Yes Paz, Jose E, MD  azelastine  (ASTELIN ) 0.1 % nasal spray Place 2 sprays into both nostrils 2 (two) times daily as needed for rhinitis or allergies. Use in each nostril as directed 11/15/22  Yes Paz, Aloysius BRAVO, MD  carvedilol  (COREG ) 25 MG tablet Take 1 tablet (25 mg total) by mouth 2 (two) times daily with a meal. 04/22/24  Yes Paz, Aloysius BRAVO, MD  ezetimibe  (ZETIA ) 10 MG tablet Take 1 tablet (10 mg total) by mouth daily. 04/22/24  Yes Paz, Jose E, MD  levothyroxine  (SYNTHROID )  100 MCG tablet Take 1 tablet (100 mcg total) by mouth daily before breakfast. 04/22/24  Yes Paz, Jose E, MD  lisinopril  (ZESTRIL ) 40 MG tablet Take 1 tablet (40 mg total) by mouth daily. 04/22/24  Yes Paz, Jose E, MD  sodium chloride  (OCEAN) 0.65 % SOLN nasal spray Place 1 spray into both nostrils as needed for congestion.   Yes [provider]  spironolactone  (ALDACTONE ) 25 MG tablet Take 0.5 tablets (12.5 mg total) by mouth daily. 04/22/24  Yes Paz, Aloysius BRAVO, MD     Positive ROS: All other systems have been reviewed and were otherwise negative with  the exception of those mentioned in the HPI and as above.  Physical Exam:  Vitals:   07/15/24 1239  BP: 130/86  Pulse: (!) 55  Resp: 20  Temp: 97.7 F (36.5 C)  SpO2: 97%   General: Alert, no acute distress Cardiovascular: No pedal edema Respiratory: No cyanosis, no use of accessory musculature GI: No organomegaly, abdomen is soft and non-tender Skin: No lesions in the area of chief complaint Neurologic: Sensation intact distally Psychiatric: Patient is competent for consent with normal mood and affect Lymphatic: No axillary or cervical lymphadenopathy  MUSCULOSKELETAL: Left foot demonstrates some thickening of the plantar soft tissue at the hallux MTP joint.  Tender to palpation.  Evidence of chronic wound and callus formation.  He does have claw toe deformities and high arch.  Foot is warm and well-perfused.  Assessment: Left foot cavovarus with claw toe deformities Left foot medial sesamoid hypertrophy with underlying callus and chronic ulceration   Plan: Plan for left foot sesamoidectomy with tendon transfer and debridement of his ulceration.  He did have a little bit of fluid on his MRI scan and therefore we may consider sending some culture if there appears to be devitalized tissue within the area surrounding the sesamoid.  We discussed the risks, benefits and alternatives of surgery which include but are not limited to  wound healing complications, infection, nonunion, malunion, need for further surgery, damage to surrounding structures and continued pain.  They understand there is no guarantees to an acceptable outcome.  After weighing these risks they opted to proceed with surgery.     Lonni JONELLE Pae, MD    07/15/2024 1:45 PM

## 2024-07-16 ENCOUNTER — Encounter (HOSPITAL_COMMUNITY): Payer: Self-pay | Admitting: Orthopaedic Surgery

## 2024-07-16 NOTE — Anesthesia Postprocedure Evaluation (Signed)
 Anesthesia Post Note  Patient: Gary [name]  Procedure(s) Performed: EXCISION, SESAMOID BONE (Left) IRRIGATION AND DEBRIDEMENT WOUND (Left)     Patient location during evaluation: PACU Anesthesia Type: General Level of consciousness: awake and alert Pain management: pain level controlled Vital Signs Assessment: post-procedure vital signs reviewed and stable Respiratory status: spontaneous breathing, nonlabored ventilation and respiratory function stable Cardiovascular status: blood pressure returned to baseline and stable Postop Assessment: no apparent nausea or vomiting Anesthetic complications: no   No notable events documented.  Last Vitals:  Vitals:   07/15/24 1545 07/15/24 1600  BP: 135/67 128/70  Pulse: (!) 57 (!) 54  Resp: 19 15  Temp:  36.6 C  SpO2: 96% 98%    Last Pain:  Vitals:   07/15/24 1600  TempSrc:   PainSc: 0-No pain                 Butler Levander Pinal

## 2024-07-16 NOTE — Op Note (Addendum)
 Addendum 07/30/2024: In the body of the note there is discussion of incision and drainage of abscess of multiple bursal spaces.  This was carried out using a separate plantar incision through the area of ulceration.  This was in fact multiple bursal spaces that included the flexor hallucis brevis tendon sheath, subcutaneous fat and surrounding first MTP joint bursal cavities. Multiple bursal spaces were deloculated bluntly using a hemostat.   Gary [name] male 68 y.o. 07/15/2024  PreOperative Diagnosis: Left foot medial sesamoid hypertrophy Left foot forefoot abscess Left forefoot chronic ulceration Left hallux toenail onychomycosis  PostOperative Diagnosis: Same  PROCEDURE: Left foot medial sesamoidectomy Left foot incision and drainage of abscess multiple bursal spaces Left foot debridement of ulceration, chronic skin and subcutaneous tissue Removal of hallux toenail  SURGEON: Lonni Pae, MD  ASSISTANT: Jesse [place], PA-C was necessary for patient positioning, prep and assistance with surgery.  ANESTHESIA: General LMA with infiltration of local anesthetic quarter percent Marcaine  plain  FINDINGS: See below  IMPLANTS: None  INDICATIONS:67 y.o. male with neuropathy presented to the clinic with chronic ulceration of the hallux MTP joint plantarly.  He was noted to have sesamoid hypertrophy and fluid collection below the sesamoid and bursal space.  He was indicated for surgery.   Patient understood the risks, benefits and alternatives to surgery which include but are not limited to wound healing complications, infection, nonunion, malunion, need for further surgery as well as damage to surrounding structures. They also understood the potential for continued pain in that there were no guarantees of acceptable outcome After weighing these risks the patient opted to proceed with surgery.  PROCEDURE: Patient was identified in the preoperative holding area.  The left foot was  marked by myself.  Consent was signed by myself and the patient.  Block was performed by anesthesia in the preoperative holding area.  Patient was taken to the operative suite and placed supine on the operative table.  General LMA anesthesia was induced without difficulty. Bump was placed under the operative hip and bone foam was used.  All bony prominences were well padded. Preoperative antibiotics were given. The extremity was prepped and draped in the usual sterile fashion and surgical timeout was performed.    Medial sesamoidectomy: We began by making a longitudinal incision overlying the medial aspect of the hallux MTP joint.  The incision was carried sharply through skin and subcutaneous tissue.  Blunt dissection was used to mobilize skin flaps and care was taken to protect these plantar branch of the medial nerve.  Then the capsular tissue was identified and opened in line with the incision.  Were able to identify the sesamoid.  There was significant hypertrophy.  The sesamoid was circumferentially dissected out and removed uneventfully.  The flexor houses longus tendon was intact and on injured.  We are then able to palpate around and felt some fluctuance.    Incision and drainage of abscess, multiple bursal spaces: An incision was made more plantarly through the plantar bursal tissue and a rush of fluid was identified.  There was some bloody fluid that was serous in nature.  There was some devitalized tissue.  The abscess cavity was deloculated using hemostat spreader and the area was irrigated copiously.  Then some tissue was obtained and sent for culture.  Then the area was inspected and found to be acceptably cleaned.  Some vancomycin powder was placed within the abscess cavity and around the area of the sesamoid.  Then the capsular tissue and the remaining  portion of the flexor hallucis brevis tendon was repaired using a 2-0 Vicryl suture.  Debridement of chronic ulceration: Once the wound  was closed we turned our attention to the plantar ulceration.  Debridement was carried out of this area.  The ulceration area was approximately 3 x 3 cm before debridement and it was 3 x 3 cm after debridement.  Devitalized tissue skin, fascial tissue and subcutaneous tissue was removed and discarded.  Dissection instruments were 15 blade and sharp dissection scissors.  The area was irrigated with normal saline.  There was healthy tissue present after debridement.  The depth of the wound was 0.5 cm and was 0.5 cm at the end of the debridement.  Hallux toenail removal: We then turned our attention to the hallux toenail.  Using a Therapist, nutritional the area between the nail and the sterile matrix was identified and the nail was elevated.  Then it was removed uneventfully.  Dissection scissor was used to trim off any overlying paronychia type tissue.  The underlying tissue was healthy.  The toe was then completely irrigated.  Soft dressing was placed.  He was then awakened from anesthesia and taken recovery in stable condition.  No complications.    POST OPERATIVE INSTRUCTIONS: Heel weightbearing in a postoperative shoe Keep dressing in place Follow-up in 2 weeks for suture removal if appropriate Will follow-up on culture results  TOURNIQUET TIME: Less than 1 hour  BLOOD LOSS:  Minimal         DRAINS: none         SPECIMEN: none       COMPLICATIONS:  * No complications entered in OR log *         Disposition: PACU - hemodynamically stable.         Condition: stable

## 2024-07-20 LAB — AEROBIC/ANAEROBIC CULTURE W GRAM STAIN (SURGICAL/DEEP WOUND)
Culture: NO GROWTH
Gram Stain: NONE SEEN

## 2024-07-25 ENCOUNTER — Other Ambulatory Visit: Payer: Self-pay | Admitting: Internal Medicine

## 2024-07-26 ENCOUNTER — Ambulatory Visit (HOSPITAL_COMMUNITY)
Admission: RE | Admit: 2024-07-26 | Discharge: 2024-07-26 | Disposition: A | Discharge status: Home / Self Care | Source: Ambulatory Visit | Attending: Urology | Admitting: Urology

## 2024-07-26 ENCOUNTER — Encounter (HOSPITAL_COMMUNITY)
Admission: RE | Admit: 2024-07-26 | Discharge: 2024-07-26 | Disposition: A | Discharge status: Home / Self Care | Source: Ambulatory Visit | Attending: Urology | Admitting: Urology

## 2024-07-26 DIAGNOSIS — C775 Secondary and unspecified malignant neoplasm of intrapelvic lymph nodes: Secondary | ICD-10-CM | POA: Insufficient documentation

## 2024-07-26 DIAGNOSIS — C61 Malignant neoplasm of prostate: Secondary | ICD-10-CM | POA: Insufficient documentation

## 2024-07-26 DIAGNOSIS — K760 Fatty (change of) liver, not elsewhere classified: Secondary | ICD-10-CM | POA: Diagnosis not present

## 2024-07-26 MED ORDER — IOHEXOL 300 MG/ML  SOLN
100.0000 mL | Freq: Once | INTRAMUSCULAR | Status: AC | PRN
  Administered 2024-07-26: 100 mL via INTRAVENOUS

## 2024-07-26 MED ORDER — TECHNETIUM TC 99M MEDRONATE IV KIT
20.0000 | PACK | Freq: Once | INTRAVENOUS | Status: AC | PRN
  Administered 2024-07-26: 19.9 via INTRAVENOUS

## 2024-07-26 MED ORDER — SODIUM CHLORIDE (PF) 0.9 % IJ SOLN
INTRAMUSCULAR | Status: AC
  Filled 2024-07-26: qty 50

## 2024-07-30 ENCOUNTER — Other Ambulatory Visit: Payer: Self-pay | Admitting: Internal Medicine

## 2024-07-30 MED ORDER — AZELASTINE HCL 0.1 % NA SOLN: 2.0000 | Freq: Two times a day (BID) | NASAL | 1 refills | Status: AC | PRN

## 2024-07-30 NOTE — Telephone Encounter (Signed)
 Copied from CRM [id number]. Topic: Clinical - Medication Refill >> Jul 30, 2024  4:08 PM Roselie C wrote: Medication: azelastine  (ASTELIN ) 0.1 % nasal spray  Has the patient contacted their pharmacy? Yes (Agent: If no, request that the patient contact the pharmacy for the refill. If patient does not wish to contact the pharmacy document the reason why and proceed with request.) (Agent: If yes, when and what did the pharmacy advise?)  This is the patient's preferred pharmacy:   [facility] [place] [place] - [address] [place] [address] Phone: [phone] Fax: [phone]   Is this the correct pharmacy for this prescription? Yes If no, delete pharmacy and type the correct one.   Has the prescription been filled recently? No  Is the patient out of the medication? Yes  Has the patient been seen for an appointment in the last year OR does the patient have an upcoming appointment? Yes  Can we respond through MyChart? Yes  Agent: Please be advised that Rx refills may take up to 3 business days. We ask that you follow-up with your pharmacy.

## 2024-10-30 ENCOUNTER — Other Ambulatory Visit: Payer: Self-pay | Admitting: Internal Medicine

## 2024-11-16 ENCOUNTER — Ambulatory Visit: Admitting: Internal Medicine
